# Patient Record
Sex: Female | Born: 1955 | Race: Black or African American | Hispanic: No | Marital: Married | Smoking: Never smoker
Health system: Southern US, Community
[De-identification: ages and names within clinical notes are randomized; demographics above are authoritative.]

## PROBLEM LIST (undated history)

## (undated) DIAGNOSIS — J309 Allergic rhinitis, unspecified: Secondary | ICD-10-CM

## (undated) DIAGNOSIS — N183 Chronic kidney disease, stage 3 unspecified: Secondary | ICD-10-CM

## (undated) DIAGNOSIS — Z923 Personal history of irradiation: Secondary | ICD-10-CM

## (undated) DIAGNOSIS — F419 Anxiety disorder, unspecified: Secondary | ICD-10-CM

## (undated) DIAGNOSIS — M1990 Unspecified osteoarthritis, unspecified site: Secondary | ICD-10-CM

## (undated) DIAGNOSIS — C801 Malignant (primary) neoplasm, unspecified: Secondary | ICD-10-CM

## (undated) DIAGNOSIS — I10 Essential (primary) hypertension: Secondary | ICD-10-CM

## (undated) DIAGNOSIS — D649 Anemia, unspecified: Secondary | ICD-10-CM

## (undated) DIAGNOSIS — T7840XA Allergy, unspecified, initial encounter: Secondary | ICD-10-CM

## (undated) DIAGNOSIS — E785 Hyperlipidemia, unspecified: Secondary | ICD-10-CM

## (undated) DIAGNOSIS — K219 Gastro-esophageal reflux disease without esophagitis: Secondary | ICD-10-CM

## (undated) DIAGNOSIS — K5904 Chronic idiopathic constipation: Secondary | ICD-10-CM

## (undated) DIAGNOSIS — Z9221 Personal history of antineoplastic chemotherapy: Secondary | ICD-10-CM

## (undated) DIAGNOSIS — E079 Disorder of thyroid, unspecified: Secondary | ICD-10-CM

## (undated) DIAGNOSIS — G4730 Sleep apnea, unspecified: Secondary | ICD-10-CM

## (undated) DIAGNOSIS — R42 Dizziness and giddiness: Secondary | ICD-10-CM

## (undated) DIAGNOSIS — G4700 Insomnia, unspecified: Secondary | ICD-10-CM

## (undated) HISTORY — DX: Gastro-esophageal reflux disease without esophagitis: K21.9

## (undated) HISTORY — DX: Insomnia, unspecified: G47.00

## (undated) HISTORY — DX: Chronic kidney disease, stage 3 unspecified: N18.30

## (undated) HISTORY — PX: THYROIDECTOMY, PARTIAL: SHX18

## (undated) HISTORY — DX: Malignant (primary) neoplasm, unspecified: C80.1

## (undated) HISTORY — DX: Disorder of thyroid, unspecified: E07.9

## (undated) HISTORY — DX: Allergic rhinitis, unspecified: J30.9

## (undated) HISTORY — DX: Unspecified osteoarthritis, unspecified site: M19.90

## (undated) HISTORY — DX: Allergy, unspecified, initial encounter: T78.40XA

## (undated) HISTORY — DX: Anxiety disorder, unspecified: F41.9

## (undated) HISTORY — PX: OTHER SURGICAL HISTORY: SHX169

## (undated) HISTORY — DX: Dizziness and giddiness: R42

## (undated) HISTORY — DX: Chronic idiopathic constipation: K59.04

## (undated) HISTORY — DX: Hyperlipidemia, unspecified: E78.5

## (undated) HISTORY — PX: ABDOMINAL HYSTERECTOMY: SHX81

## (undated) HISTORY — DX: Anemia, unspecified: D64.9

---

## 1999-12-28 ENCOUNTER — Emergency Department (HOSPITAL_COMMUNITY): Admission: EM | Admit: 1999-12-28 | Discharge: 1999-12-28 | Payer: Self-pay | Admitting: Emergency Medicine

## 1999-12-28 ENCOUNTER — Encounter: Payer: Self-pay | Admitting: Emergency Medicine

## 2002-08-28 HISTORY — PX: BREAST LUMPECTOMY: SHX2

## 2009-07-25 ENCOUNTER — Ambulatory Visit: Payer: Self-pay | Admitting: Interventional Radiology

## 2009-07-25 ENCOUNTER — Emergency Department (HOSPITAL_BASED_OUTPATIENT_CLINIC_OR_DEPARTMENT_OTHER): Admission: EM | Admit: 2009-07-25 | Discharge: 2009-07-25 | Payer: Self-pay | Admitting: Emergency Medicine

## 2010-11-30 LAB — BASIC METABOLIC PANEL
BUN: 15 mg/dL (ref 6–23)
CO2: 27 mEq/L (ref 19–32)
Calcium: 10.2 mg/dL (ref 8.4–10.5)
Chloride: 107 mEq/L (ref 96–112)
Creatinine, Ser: 1.2 mg/dL (ref 0.4–1.2)
GFR calc Af Amer: 56 mL/min — ABNORMAL LOW (ref >60)
GFR calc non Af Amer: 47 mL/min — ABNORMAL LOW (ref >60)
Glucose, Bld: 71 mg/dL (ref 70–99)
Potassium: 4 mEq/L (ref 3.5–5.1)
Sodium: 145 mEq/L (ref 135–145)

## 2010-11-30 LAB — URINALYSIS, ROUTINE W REFLEX MICROSCOPIC
Bilirubin Urine: NEGATIVE
Glucose, UA: NEGATIVE mg/dL
Hgb urine dipstick: NEGATIVE
Ketones, ur: NEGATIVE mg/dL
Nitrite: NEGATIVE
Protein, ur: NEGATIVE mg/dL
Specific Gravity, Urine: 1.018 (ref 1.005–1.030)
Urobilinogen, UA: 0.2 mg/dL (ref 0.0–1.0)
pH: 6 (ref 5.0–8.0)

## 2010-11-30 LAB — CBC
HCT: 35.7 % — ABNORMAL LOW (ref 36.0–46.0)
Hemoglobin: 12.4 g/dL (ref 12.0–15.0)
MCHC: 34.6 g/dL (ref 30.0–36.0)
MCV: 89.2 fL (ref 78.0–100.0)
Platelets: 230 10*3/uL (ref 150–400)
RBC: 4 MIL/uL (ref 3.87–5.11)
RDW: 12.7 % (ref 11.5–15.5)
WBC: 5.7 10*3/uL (ref 4.0–10.5)

## 2010-11-30 LAB — DIFFERENTIAL
Basophils Absolute: 0.1 10*3/uL (ref 0.0–0.1)
Eosinophils Relative: 3 % (ref 0–5)
Lymphocytes Relative: 26 % (ref 12–46)
Lymphs Abs: 1.5 10*3/uL (ref 0.7–4.0)
Monocytes Absolute: 0.4 10*3/uL (ref 0.1–1.0)
Neutro Abs: 3.5 10*3/uL (ref 1.7–7.7)

## 2013-03-02 ENCOUNTER — Encounter (HOSPITAL_BASED_OUTPATIENT_CLINIC_OR_DEPARTMENT_OTHER): Payer: Self-pay | Admitting: Emergency Medicine

## 2013-03-02 ENCOUNTER — Emergency Department (HOSPITAL_BASED_OUTPATIENT_CLINIC_OR_DEPARTMENT_OTHER)
Admission: EM | Admit: 2013-03-02 | Discharge: 2013-03-02 | Disposition: A | Discharge status: Home / Self Care | Payer: BC Managed Care – PPO | Attending: Emergency Medicine | Admitting: Emergency Medicine

## 2013-03-02 DIAGNOSIS — M79609 Pain in unspecified limb: Secondary | ICD-10-CM | POA: Insufficient documentation

## 2013-03-02 DIAGNOSIS — G4762 Sleep related leg cramps: Secondary | ICD-10-CM

## 2013-03-02 DIAGNOSIS — Z79899 Other long term (current) drug therapy: Secondary | ICD-10-CM | POA: Insufficient documentation

## 2013-03-02 DIAGNOSIS — I10 Essential (primary) hypertension: Secondary | ICD-10-CM | POA: Insufficient documentation

## 2013-03-02 HISTORY — DX: Essential (primary) hypertension: I10

## 2013-03-02 MED ORDER — CYCLOBENZAPRINE HCL 10 MG PO TABS: 10.0000 mg | ORAL_TABLET | Freq: Two times a day (BID) | ORAL | Status: DC | PRN

## 2013-03-02 NOTE — ED Provider Notes (Addendum)
   History    CSN: [id number] Arrival date & time 03/02/13  0726  First MD Initiated Contact with Patient 03/02/13 [phone]     Chief Complaint  Patient presents with  . Leg Pain   (Consider location/radiation/quality/duration/timing/severity/associated sxs/prior Treatment) HPI  Patient was awakened this morning with severe left calf pain.  Has gotten better but also in the anterior part of her thigh.  She's had this before in the other leg and in the same leg.  It occurs and time to time and can be severe.  She's been checked for blood clots before and they've been negative.  She has no recent history of leg swelling or shortness of breath or long trips. Past Medical History  Diagnosis Date  . Hypertension    No past surgical history on file. No family history on file. History  Substance Use Topics  . Smoking status: Not on file  . Smokeless tobacco: Not on file  . Alcohol Use: Not on file   OB History   Grav Para Term Preterm Abortions TAB SAB Ect Mult Living                 Review of Systems All other systems reviewed and are negative Allergies  Review of patient's allergies indicates no known allergies.  Home Medications   Current Outpatient Rx  Name  Route  Sig  Dispense  Refill  . benazepril-hydrochlorthiazide (LOTENSIN HCT) 10-12.5 MG per tablet   Oral   Take 1 tablet by mouth daily.         . cyclobenzaprine (FLEXERIL) 10 MG tablet   Oral   Take 1 tablet (10 mg total) by mouth 2 (two) times daily as needed for muscle spasms.   20 tablet   0    BP 151/73  Pulse 89  Temp(Src) 98.7 F (37.1 C) (Oral)  Resp 18  Ht 5\' 9"  (1.753 m)  Wt 200 lb (90.719 kg)  BMI 29.52 kg/m2  SpO2 98% Physical Exam  Nursing note and vitals reviewed. Constitutional: She is oriented to person, place, and time. She appears well-developed and well-nourished. No distress.  HENT:  Head: Normocephalic and atraumatic.  Eyes: Pupils are equal, round, and reactive to light.  Neck:  Normal range of motion.  Cardiovascular: Normal rate and intact distal pulses.   Pulmonary/Chest: No respiratory distress.  Abdominal: Normal appearance. She exhibits no distension.  Musculoskeletal: Normal range of motion. She exhibits no edema and no tenderness.  Homans sign negative  Neurological: She is alert and oriented to person, place, and time. No cranial nerve deficit.  Skin: Skin is warm and dry. No rash noted.  Psychiatric: She has a normal mood and affect. Her behavior is normal.    ED Course  Procedures (including critical care time) Labs Reviewed - No data to display No results found. 1. Nocturnal leg cramps     MDM  The history and physical exam more consistent with nocturnal leg cramps then DVT.  Patient does not want to wait for an ultrasound.  Instructed her on treatment and gave her information from [facility] clinic on nocturnal leg cramps.  She is going to follow up with her doctor tomorrow.  Invited her to return after 10 AM for ultrasound if she still has concerns.  Nelia Shi, MD 03/02/13 0800  Nelia Shi, MD 03/02/13 0800

## 2013-03-02 NOTE — ED Notes (Signed)
Pt woke up this morning with cramping pain in left calf, and states pain has now moved from calf up to left thigh.

## 2014-01-20 ENCOUNTER — Ambulatory Visit (HOSPITAL_BASED_OUTPATIENT_CLINIC_OR_DEPARTMENT_OTHER)
Admission: RE | Admit: 2014-01-20 | Discharge: 2014-01-20 | Disposition: A | Discharge status: Home / Self Care | Payer: BC Managed Care – PPO | Source: Ambulatory Visit | Attending: *Deleted | Admitting: *Deleted

## 2014-01-20 ENCOUNTER — Other Ambulatory Visit (HOSPITAL_BASED_OUTPATIENT_CLINIC_OR_DEPARTMENT_OTHER): Payer: Self-pay | Admitting: *Deleted

## 2014-01-20 DIAGNOSIS — M79605 Pain in left leg: Secondary | ICD-10-CM

## 2014-01-20 DIAGNOSIS — R609 Edema, unspecified: Secondary | ICD-10-CM | POA: Insufficient documentation

## 2014-01-20 DIAGNOSIS — M79609 Pain in unspecified limb: Secondary | ICD-10-CM | POA: Insufficient documentation

## 2014-06-30 DIAGNOSIS — M5416 Radiculopathy, lumbar region: Secondary | ICD-10-CM | POA: Insufficient documentation

## 2014-06-30 DIAGNOSIS — G9519 Other vascular myelopathies: Secondary | ICD-10-CM | POA: Insufficient documentation

## 2014-06-30 DIAGNOSIS — M5136 Other intervertebral disc degeneration, lumbar region: Secondary | ICD-10-CM | POA: Insufficient documentation

## 2015-10-03 DIAGNOSIS — Z8679 Personal history of other diseases of the circulatory system: Secondary | ICD-10-CM | POA: Insufficient documentation

## 2015-10-03 DIAGNOSIS — Z8719 Personal history of other diseases of the digestive system: Secondary | ICD-10-CM | POA: Insufficient documentation

## 2015-10-03 DIAGNOSIS — E785 Hyperlipidemia, unspecified: Secondary | ICD-10-CM | POA: Insufficient documentation

## 2015-10-03 DIAGNOSIS — K5900 Constipation, unspecified: Secondary | ICD-10-CM | POA: Insufficient documentation

## 2015-10-03 DIAGNOSIS — M2550 Pain in unspecified joint: Secondary | ICD-10-CM | POA: Insufficient documentation

## 2015-10-03 DIAGNOSIS — G4700 Insomnia, unspecified: Secondary | ICD-10-CM | POA: Insufficient documentation

## 2015-10-03 DIAGNOSIS — E890 Postprocedural hypothyroidism: Secondary | ICD-10-CM | POA: Insufficient documentation

## 2015-10-03 DIAGNOSIS — F418 Other specified anxiety disorders: Secondary | ICD-10-CM | POA: Insufficient documentation

## 2015-10-03 DIAGNOSIS — N183 Chronic kidney disease, stage 3 unspecified: Secondary | ICD-10-CM | POA: Insufficient documentation

## 2015-10-03 DIAGNOSIS — I10 Essential (primary) hypertension: Secondary | ICD-10-CM | POA: Insufficient documentation

## 2015-10-03 DIAGNOSIS — Z853 Personal history of malignant neoplasm of breast: Secondary | ICD-10-CM | POA: Insufficient documentation

## 2015-10-03 DIAGNOSIS — K219 Gastro-esophageal reflux disease without esophagitis: Secondary | ICD-10-CM | POA: Insufficient documentation

## 2017-11-06 DIAGNOSIS — E669 Obesity, unspecified: Secondary | ICD-10-CM | POA: Insufficient documentation

## 2017-11-06 DIAGNOSIS — Z6836 Body mass index (BMI) 36.0-36.9, adult: Secondary | ICD-10-CM | POA: Insufficient documentation

## 2020-01-19 DIAGNOSIS — H2513 Age-related nuclear cataract, bilateral: Secondary | ICD-10-CM | POA: Insufficient documentation

## 2020-01-19 DIAGNOSIS — H16223 Keratoconjunctivitis sicca, not specified as Sjogren's, bilateral: Secondary | ICD-10-CM | POA: Insufficient documentation

## 2020-01-19 DIAGNOSIS — H43813 Vitreous degeneration, bilateral: Secondary | ICD-10-CM | POA: Insufficient documentation

## 2020-02-12 DIAGNOSIS — R7301 Impaired fasting glucose: Secondary | ICD-10-CM | POA: Insufficient documentation

## 2020-11-05 ENCOUNTER — Ambulatory Visit (INDEPENDENT_AMBULATORY_CARE_PROVIDER_SITE_OTHER): Payer: Medicare Other | Admitting: Pulmonary Disease

## 2020-11-05 ENCOUNTER — Other Ambulatory Visit: Payer: Self-pay

## 2020-11-05 ENCOUNTER — Ambulatory Visit (INDEPENDENT_AMBULATORY_CARE_PROVIDER_SITE_OTHER): Payer: Medicare Other

## 2020-11-05 ENCOUNTER — Encounter: Payer: Self-pay | Admitting: Pulmonary Disease

## 2020-11-05 VITALS — BP 132/84 | HR 75 | Temp 97.4°F | Ht 69.0 in | Wt 243.0 lb

## 2020-11-05 DIAGNOSIS — R062 Wheezing: Secondary | ICD-10-CM

## 2020-11-05 NOTE — Progress Notes (Signed)
Karina Ferguson    [id number]    July 16, 1956  Primary Care Physician:Everly, Barbarann Ehlers, DO  Referring Physician: No referring provider defined for this encounter.  Chief complaint:   Wheezing, shortness of breath, cough  HPI:  Wheezing cough and shortness of breath for couple years  Was prescribed Singulair-has only been using this as she feels needed Recently prescribed albuterol-has not started to use it yet  Shortness of breath happens at any time the cough and wheezing does happen intermittently as well, no significant predilection for any particular time of the day  Denies a past history of lung disease Never smoker Denies having specific allergies  She does have symptoms suggesting she may have some reflux  Cough and wheezing appears to be worse when laying down and sometimes just at bedtime  No family history of lung disease  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 amLODipine (NORVASC) 10 MG tablet Take 1 tablet by mouth daily.  . benazepril-hydrochlorthiazide (LOTENSIN HCT) 10-12.5 MG per tablet Take 1 tablet by mouth daily.  . hydrochlorothiazide (HYDRODIURIL) 25 MG tablet Take 25 mg by mouth daily.  . metoprolol succinate (TOPROL-XL) 100 MG 24 hr tablet Take 1 tablet by mouth daily.  . montelukast (SINGULAIR) 10 MG tablet Take 10 mg by mouth daily.  . cyclobenzaprine (FLEXERIL) 10 MG tablet Take 1 tablet (10 mg total) by mouth 2 (two) times daily as needed for muscle spasms. (Patient not taking: Reported on 11/05/2020)   No facility-administered encounter medications on file as of 11/05/2020.    Allergies as of 11/05/2020  . (No Known Allergies)    Past Medical History:  Diagnosis Date  . Hypertension       No family history on file.  Social History   Socioeconomic History  .  Marital status: Married    Spouse name: Not on file  . Number of children: Not on file  . Years of education: Not on file  . Highest education level: Not on file  Occupational History  . Not on file  Tobacco Use  . Smoking status: Never Smoker  . Smokeless tobacco: Never Used  Substance and Sexual Activity  . Alcohol use: Not on file  . Drug use: Not on file  . Sexual activity: Not on file  Other Topics Concern  . Not on file  Social History Narrative  . Not on file   Social Determinants of Health   Financial Resource Strain: Not on file  Food Insecurity: Not on file  Transportation Needs: Not on file  Physical Activity: Not on file  Stress: Not on file  Social Connections: Not on file  Intimate Partner Violence: Not on file    Review of Systems  Respiratory: Positive for cough, shortness of breath and wheezing.     Vitals:   11/05/20 1329  BP: 132/84  Pulse: 75  Temp: (!) 97.4 F (36.3 C)  SpO2: 99%     Physical Exam Constitutional:      Appearance: She is obese.  HENT:     Head: Normocephalic.     Mouth/Throat:     Mouth: Mucous membranes are moist.  Eyes:     Pupils: Pupils are equal, round, and reactive to light.  Cardiovascular:     Rate and Rhythm: Normal rate and regular rhythm.  Heart sounds: No murmur heard.   Pulmonary:     Effort: No respiratory distress.     Breath sounds: No stridor. No wheezing or rhonchi.  Musculoskeletal:     Cervical back: No rigidity or tenderness.  Neurological:     Mental Status: She is alert.  Psychiatric:        Mood and Affect: Mood normal.    Data Reviewed: No recent chest x-rays  Assessment:  Cough/wheezing  Shortness of breath  Probable environmental allergies  Probable reflux  Plan/Recommendations:  Encouraged to use Singulair on a regular basis  Use albuterol as needed up to 4 times a day as needed  Schedule for chest x-ray today  Schedule pulmonary function test  Encouraged to  discuss possibility of starting antireflux medications  Tentative follow-up in 4 to 6 weeks   Sherrilyn Rist MD [facility] 11/05/2020, 1:36 PM  CC: No ref. provider found

## 2020-11-05 NOTE — Patient Instructions (Signed)
Cough, wheezing, shortness of breath  -Use Singulair on a regular basis -Use albuterol as needed up to 4 times a day -Ensure that you look up how to use inhalers, so that you get as good an affect as possible  We will schedule you for a breathing study Obtain a chest x-ray today  I will see you back in 4 to 6 weeks  Talk to your doctor about reflux-you may need medications to help this as it may contribute to cough and wheezing

## 2020-12-17 ENCOUNTER — Other Ambulatory Visit (HOSPITAL_COMMUNITY)
Admission: RE | Admit: 2020-12-17 | Discharge: 2020-12-17 | Disposition: A | Discharge status: Home / Self Care | Payer: Medicare Other | Source: Ambulatory Visit | Attending: Pulmonary Disease | Admitting: Pulmonary Disease

## 2020-12-17 DIAGNOSIS — Z20822 Contact with and (suspected) exposure to covid-19: Secondary | ICD-10-CM | POA: Insufficient documentation

## 2020-12-17 DIAGNOSIS — Z01812 Encounter for preprocedural laboratory examination: Secondary | ICD-10-CM | POA: Diagnosis present

## 2020-12-17 LAB — SARS CORONAVIRUS 2 (TAT 6-24 HRS): SARS Coronavirus 2: NEGATIVE

## 2020-12-20 ENCOUNTER — Ambulatory Visit (INDEPENDENT_AMBULATORY_CARE_PROVIDER_SITE_OTHER): Payer: Medicare Other | Admitting: Pulmonary Disease

## 2020-12-20 ENCOUNTER — Other Ambulatory Visit: Payer: Self-pay

## 2020-12-20 ENCOUNTER — Encounter: Payer: Self-pay | Admitting: Pulmonary Disease

## 2020-12-20 VITALS — BP 146/86 | HR 82 | Temp 97.8°F | Ht 69.0 in | Wt 243.0 lb

## 2020-12-20 DIAGNOSIS — R062 Wheezing: Secondary | ICD-10-CM | POA: Diagnosis not present

## 2020-12-20 DIAGNOSIS — R059 Cough, unspecified: Secondary | ICD-10-CM | POA: Diagnosis not present

## 2020-12-20 LAB — PULMONARY FUNCTION TEST
DL/VA % pred: 132 %
DL/VA: 5.34 ml/min/mmHg/L
DLCO cor % pred: 101 %
DLCO cor: 23.52 ml/min/mmHg
DLCO unc % pred: 101 %
DLCO unc: 23.52 ml/min/mmHg
FEF 25-75 Post: 2.29 L/sec
FEF 25-75 Pre: 2.14 L/sec
FEF2575-%Change-Post: 6 %
FEF2575-%Pred-Post: 103 %
FEF2575-%Pred-Pre: 96 %
FEV1-%Change-Post: 1 %
FEV1-%Pred-Post: 99 %
FEV1-%Pred-Pre: 97 %
FEV1-Post: 2.39 L
FEV1-Pre: 2.35 L
FEV1FVC-%Change-Post: -5 %
FEV1FVC-%Pred-Pre: 103 %
FEV6-%Change-Post: 5 %
FEV6-%Pred-Post: 103 %
FEV6-%Pred-Pre: 97 %
FEV6-Post: 3.08 L
FEV6-Pre: 2.91 L
FEV6FVC-%Change-Post: -1 %
FEV6FVC-%Pred-Post: 101 %
FEV6FVC-%Pred-Pre: 103 %
FVC-%Change-Post: 7 %
FVC-%Pred-Post: 101 %
FVC-%Pred-Pre: 94 %
FVC-Post: 3.12 L
FVC-Pre: 2.91 L
Post FEV1/FVC ratio: 77 %
Post FEV6/FVC ratio: 99 %
Pre FEV1/FVC ratio: 81 %
Pre FEV6/FVC Ratio: 100 %
RV % pred: 119 %
RV: 2.82 L
TLC % pred: 104 %
TLC: 6.05 L

## 2020-12-20 NOTE — Progress Notes (Signed)
PFT done today. 

## 2020-12-20 NOTE — Patient Instructions (Signed)
I will see you back in 6 months  Your breathing study as we looked over was within normal limits  If you are still having significant wheezing that is concerning, trial with inhaler will be appropriate  Continue using medications for the reflux  Graded exercises as tolerated, weight loss efforts  Call us with any significant concerns

## 2020-12-20 NOTE — Progress Notes (Signed)
Karina Ferguson    [id number]    [birth year]  Primary Care Physician:Everly, Barbarann Ehlers, DO  Referring Physician: Nicola Girt, [address] [place],  [place] [address]  Chief complaint:   Wheezing, shortness of breath, cough  HPI:  Wheezing cough and shortness of breath for couple years  Wheezing is better Occasional cough is better She continues medications for reflux  She had a PFT done today which was within normal limits  Shortness of breath with significant exertion -She has not considered a self limited in any way  Denies a past history of lung disease Never smoker Denies having specific allergies  She does have symptoms suggesting she may have some reflux  Cough and wheezing appears to be worse when laying down and sometimes just at bedtime  No family history of lung disease  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 amLODipine (NORVASC) 10 MG tablet Take 1 tablet by mouth daily.  . benazepril-hydrochlorthiazide (LOTENSIN HCT) 10-12.5 MG per tablet Take 1 tablet by mouth daily.  . cyclobenzaprine (FLEXERIL) 10 MG tablet Take 1 tablet (10 mg total) by mouth 2 (two) times daily as needed for muscle spasms.  . hydrochlorothiazide (HYDRODIURIL) 25 MG tablet Take 25 mg by mouth daily.  . metoprolol succinate (TOPROL-XL) 100 MG 24 hr tablet Take 1 tablet by mouth daily.  . montelukast (SINGULAIR) 10 MG tablet Take 10 mg by mouth daily.   No facility-administered encounter medications on file as of 12/20/2020.    Allergies as of 12/20/2020  . (No Known Allergies)    Past Medical History:  Diagnosis Date  . Hypertension       No family history on file.  Social History   Socioeconomic History  . Marital status: Married    Spouse name: Not on file  . Number of  children: Not on file  . Years of education: Not on file  . Highest education level: Not on file  Occupational History  . Not on file  Tobacco Use  . Smoking status: Never Smoker  . Smokeless tobacco: Never Used  Substance and Sexual Activity  . Alcohol use: Not on file  . Drug use: Not on file  . Sexual activity: Not on file  Other Topics Concern  . Not on file  Social History Narrative  . Not on file   Social Determinants of Health   Financial Resource Strain: Not on file  Food Insecurity: Not on file  Transportation Needs: Not on file  Physical Activity: Not on file  Stress: Not on file  Social Connections: Not on file  Intimate Partner Violence: Not on file    Review of Systems  Constitutional: Negative for fatigue.  Respiratory: Positive for cough, shortness of breath and wheezing.     Vitals:   12/20/20 1152  BP: (!) 146/86  Pulse: 82  Temp: 97.8 F (36.6 C)  SpO2: 98%     Physical Exam Constitutional:      Appearance: She is obese.  HENT:     Head: Normocephalic and atraumatic.     Mouth/Throat:     Mouth: Mucous membranes are moist.  Eyes:     Pupils: Pupils are equal, round, and reactive to light.  Cardiovascular:     Rate and Rhythm: Normal rate and regular  rhythm.     Heart sounds: Normal heart sounds. No murmur heard.   Pulmonary:     Effort: No respiratory distress.     Breath sounds: No stridor. No wheezing or rhonchi.  Musculoskeletal:     Cervical back: No rigidity or tenderness.  Neurological:     Mental Status: She is alert.  Psychiatric:        Mood and Affect: Mood normal.    Data Reviewed: No recent chest x-rays  PFT reviewed with patient showing no obstruction, no restriction, normal diffusing capacity  Assessment:  Cough/wheezing -Overall improved  Shortness of breath -Improved -Not limited with activities of daily living  Probable environmental allergies  Probable reflux  -On  omeprazole  Plan/Recommendations:   Continue Singulair on a regular basis   Does not desire to be on on an inhaler at present Albuterol did not really help in the past  No other changes to treatment at present  I will see her back in 6 months  I did encourage her to give [place] a call if at any time she feels that wheezing or shortness of breath is worse, a combination inhaler may be tried  I spent 30 minutes dedicated to the care of this patient on the date of this encounter to include previsit review of records, face-to-face time with the patient discussing conditions above, post visit ordering of testing, clinical documentation with electronic health record and communicated necessary findings to members of the patient's care team Sherrilyn Rist MD [facility] 12/20/2020, 12:00 PM  CC: Nicola Girt, DO

## 2021-01-10 ENCOUNTER — Other Ambulatory Visit: Payer: Self-pay

## 2021-01-10 ENCOUNTER — Encounter (HOSPITAL_COMMUNITY): Payer: Self-pay

## 2021-01-10 ENCOUNTER — Emergency Department (HOSPITAL_BASED_OUTPATIENT_CLINIC_OR_DEPARTMENT_OTHER): Payer: Medicare Other

## 2021-01-10 ENCOUNTER — Emergency Department (HOSPITAL_COMMUNITY): Payer: Medicare Other

## 2021-01-10 ENCOUNTER — Emergency Department (HOSPITAL_COMMUNITY)
Admission: EM | Admit: 2021-01-10 | Discharge: 2021-01-10 | Disposition: A | Discharge status: Home / Self Care | Payer: Medicare Other | Attending: Emergency Medicine | Admitting: Emergency Medicine

## 2021-01-10 DIAGNOSIS — M79662 Pain in left lower leg: Secondary | ICD-10-CM | POA: Insufficient documentation

## 2021-01-10 DIAGNOSIS — Z79899 Other long term (current) drug therapy: Secondary | ICD-10-CM | POA: Insufficient documentation

## 2021-01-10 DIAGNOSIS — M79652 Pain in left thigh: Secondary | ICD-10-CM | POA: Insufficient documentation

## 2021-01-10 DIAGNOSIS — R252 Cramp and spasm: Secondary | ICD-10-CM

## 2021-01-10 DIAGNOSIS — M79605 Pain in left leg: Secondary | ICD-10-CM

## 2021-01-10 DIAGNOSIS — R29818 Other symptoms and signs involving the nervous system: Secondary | ICD-10-CM

## 2021-01-10 DIAGNOSIS — H538 Other visual disturbances: Secondary | ICD-10-CM | POA: Insufficient documentation

## 2021-01-10 DIAGNOSIS — I10 Essential (primary) hypertension: Secondary | ICD-10-CM | POA: Diagnosis not present

## 2021-01-10 DIAGNOSIS — G458 Other transient cerebral ischemic attacks and related syndromes: Secondary | ICD-10-CM | POA: Diagnosis not present

## 2021-01-10 LAB — ETHANOL: Alcohol, Ethyl (B): <10 mg/dL (ref <10)

## 2021-01-10 LAB — TROPONIN I (HIGH SENSITIVITY)
Troponin I (High Sensitivity): 3 ng/L (ref <18)
Troponin I (High Sensitivity): 3 ng/L (ref <18)

## 2021-01-10 LAB — RAPID URINE DRUG SCREEN, HOSP PERFORMED
Amphetamines: NOT DETECTED
Barbiturates: NOT DETECTED
Benzodiazepines: NOT DETECTED
Cocaine: NOT DETECTED
Opiates: NOT DETECTED
Tetrahydrocannabinol: NOT DETECTED

## 2021-01-10 LAB — URINALYSIS, ROUTINE W REFLEX MICROSCOPIC
Bilirubin Urine: NEGATIVE
Glucose, UA: NEGATIVE mg/dL
Hgb urine dipstick: NEGATIVE
Ketones, ur: NEGATIVE mg/dL
Leukocytes,Ua: NEGATIVE
Nitrite: NEGATIVE
Protein, ur: NEGATIVE mg/dL
Specific Gravity, Urine: 1.01 (ref 1.005–1.030)
pH: 7 (ref 5.0–8.0)

## 2021-01-10 LAB — CBC WITH DIFFERENTIAL/PLATELET
Abs Immature Granulocytes: 0.01 10*3/uL (ref 0.00–0.07)
Basophils Absolute: 0 10*3/uL (ref 0.0–0.1)
Basophils Relative: 1 %
Eosinophils Absolute: 0.1 10*3/uL (ref 0.0–0.5)
Eosinophils Relative: 2 %
HCT: 39 % (ref 36.0–46.0)
Hemoglobin: 12.7 g/dL (ref 12.0–15.0)
Immature Granulocytes: 0 %
Lymphocytes Relative: 30 %
Lymphs Abs: 1.8 10*3/uL (ref 0.7–4.0)
MCH: 29.3 pg (ref 26.0–34.0)
MCHC: 32.6 g/dL (ref 30.0–36.0)
MCV: 89.9 fL (ref 80.0–100.0)
Monocytes Absolute: 0.4 10*3/uL (ref 0.1–1.0)
Monocytes Relative: 7 %
Neutro Abs: 3.7 10*3/uL (ref 1.7–7.7)
Neutrophils Relative %: 60 %
Platelets: 251 10*3/uL (ref 150–400)
RBC: 4.34 MIL/uL (ref 3.87–5.11)
RDW: 13.4 % (ref 11.5–15.5)
WBC: 6.1 10*3/uL (ref 4.0–10.5)
nRBC: 0 % (ref 0.0–0.2)

## 2021-01-10 LAB — COMPREHENSIVE METABOLIC PANEL
ALT: 19 U/L (ref 0–44)
AST: 21 U/L (ref 15–41)
Albumin: 3.6 g/dL (ref 3.5–5.0)
Alkaline Phosphatase: 51 U/L (ref 38–126)
Anion gap: 8 (ref 5–15)
BUN: 11 mg/dL (ref 8–23)
CO2: 27 mmol/L (ref 22–32)
Calcium: 10.2 mg/dL (ref 8.9–10.3)
Chloride: 106 mmol/L (ref 98–111)
Creatinine, Ser: 1.16 mg/dL — ABNORMAL HIGH (ref 0.44–1.00)
GFR, Estimated: 52 mL/min — ABNORMAL LOW (ref >60)
Glucose, Bld: 152 mg/dL — ABNORMAL HIGH (ref 70–99)
Potassium: 3.6 mmol/L (ref 3.5–5.1)
Sodium: 141 mmol/L (ref 135–145)
Total Bilirubin: 0.6 mg/dL (ref 0.3–1.2)
Total Protein: 6.8 g/dL (ref 6.5–8.1)

## 2021-01-10 LAB — TSH: TSH: 1.301 u[IU]/mL (ref 0.350–4.500)

## 2021-01-10 LAB — PROTIME-INR
INR: 1 (ref 0.8–1.2)
Prothrombin Time: 13.4 seconds (ref 11.4–15.2)

## 2021-01-10 MED ORDER — ACETAMINOPHEN 325 MG PO TABS
650.0000 mg | ORAL_TABLET | Freq: Once | ORAL | Status: AC
  Administered 2021-01-10: 650 mg via ORAL
  Filled 2021-01-10: qty 2

## 2021-01-10 NOTE — Progress Notes (Signed)
Left lower extremity venous duplex completed. Refer to "CV Proc" under chart review to view preliminary results.  01/10/2021 12:22 PM Kelby Aline., MHA, RVT, RDCS, RDMS

## 2021-01-10 NOTE — ED Notes (Signed)
Pt refuses MRI of brain

## 2021-01-10 NOTE — ED Provider Notes (Addendum)
Emergency Medicine Provider Triage Evaluation Note  Karina Ferguson , a 65 y.o. female  was evaluated in triage.  Pt complains of 4 days of left calf pain, now extending into the left thigh. Woke this morning with pain and altered sensation in the left arm and left face, with persistence of the LLE pain. Does endorse approximately 30 seconds of blurry vision with onset of pain. Altered sensation and blurry vision completely resolved at this time. LLE pain persists. No hx of CVA, not anticoagulated.  Altered sensation started at 8:30 am, resolved prior to arrival in the ED.  Review of Systems  Positive: Blurry vision, altered sensation in the left face and arm, Left calf pain Negative: CP, SOB, weakness, altered gait, syncope, trauma  Physical Exam  BP 133/82 (BP Location: Left Arm)   Pulse 74   Temp 98.6 F (37 C) (Oral)   Resp 16   SpO2 98%  Gen:   Awake, no distress   Resp:  Normal effort  MSK:   Moves extremities without difficulty  Other:  Neurologic exam without focal deficit at this time  Medical Decision Making  Medically screening exam initiated at 11:03 AM.  Appropriate orders placed.  Fran Neiswonger was informed that the remainder of the evaluation will be completed by another provider, this initial triage assessment does not replace that evaluation, and the importance of remaining in the ED until their evaluation is complete.  Given total resolution of patient's symptoms at this time, as well as normal neurologic exam in triage, patient does not meet criteria for code stroke. Will work up for stroke, but will not activate code stroke protocol.   Emeline Darling, PA-C 01/10/21 1111    Leon Goodnow, Gypsy Balsam, PA-C 01/10/21 1425    Lucrezia Starch, MD 01/11/21 1140

## 2021-01-10 NOTE — ED Notes (Signed)
Pt aware to check my chart for thyroid results

## 2021-01-10 NOTE — ED Notes (Signed)
Pt states left arm is "sore", denies any n/v/d, states has had some SOB for the past year with wheezing - states she doesn't know what is causing this, is seeing a pulmonologist for the wheezing, currently no wheezing, states she came today because she is having pain in her left arm and left leg and she is concerned that she may have a blood clot

## 2021-01-10 NOTE — Discharge Instructions (Addendum)
We recommended remaining in ER for further observation and obtaining the MRI to more completely rule out stroke.  You may return at any time.  If you develop any worsening symptoms, chest discomfort, numbness, weakness, speech or vision change, please come back to ER for reassessment.  Please follow-up with your primary care doctor.  They can help coordinate outpatient MRI.

## 2021-01-10 NOTE — ED Provider Notes (Signed)
[facility]PART[facility] Provider Note   CSN: [id number] Arrival date & time: 01/10/21  1040     History Chief Complaint  Patient presents with  . Weakness    Karina Ferguson is a 65 y.o. female presents with concern for 4 days of left calf pain that extends into the left thigh.  She additionally woke up this morning with pain and altered sensation in the left face and left arm with persistent left lower extremity pain.  Endorses approximately 30 seconds of blurry vision associated with the onset of her facial pain.  She endorses complete resolution of the altered sensation and blurry vision at the time of her arrival to the ED.  She does have persistent left lower extremity pain.  She has no history of CVA she is not on any anticoagulation.  Have personally reviewed the patient's medical records.  She has history of hypertension on benazepril HCTZ and amlodipine and metoprolol, also on amlodipine.  HPI     Past Medical History:  Diagnosis Date  . Hypertension     There are no problems to display for this patient.      OB History   No obstetric history on file.     No family history on file.  Social History   Tobacco Use  . Smoking status: Never Smoker  . Smokeless tobacco: Never Used  Vaping Use  . Vaping Use: Never used  Substance Use Topics  . Alcohol use: Yes  . Drug use: Never    Home Medications Prior to Admission medications   Medication Sig Start Date End Date Taking? Authorizing Provider  amLODipine (NORVASC) 10 MG tablet Take 1 tablet by mouth daily. 02/12/20   [provider]  benazepril-hydrochlorthiazide (LOTENSIN HCT) 10-12.5 MG per tablet Take 1 tablet by mouth daily.    [provider]  cyclobenzaprine (FLEXERIL) 10 MG tablet Take 1 tablet (10 mg total) by mouth 2 (two) times daily as needed for muscle spasms. 03/02/13   Leonard Schwartz, MD  hydrochlorothiazide (HYDRODIURIL) 25 MG tablet Take 25 mg by mouth  daily. 11/03/20   [provider]  metoprolol succinate (TOPROL-XL) 100 MG 24 hr tablet Take 1 tablet by mouth daily. 02/12/20   [provider]  montelukast (SINGULAIR) 10 MG tablet Take 10 mg by mouth daily. 10/16/20   [provider]    Allergies    Patient has no known allergies.  Review of Systems   Review of Systems  Constitutional: Negative.   HENT: Negative.   Respiratory: Negative.   Cardiovascular:       LEFT CALF PAIN  Gastrointestinal: Negative.   Genitourinary: Positive for dysuria and urgency. Negative for frequency.  Musculoskeletal: Positive for myalgias.  Allergic/Immunologic: Negative for immunocompromised state.  Neurological: Positive for light-headedness, numbness and headaches. Negative for weakness.    Physical Exam Updated Vital Signs BP 136/72   Pulse (!) 57   Temp 98.8 F (37.1 C) (Oral)   Resp 14   Ht 5\' 9"  (1.753 m)   Wt 110 kg   SpO2 100%   BMI 35.81 kg/m   Physical Exam Vitals and nursing note reviewed.  Constitutional:      Appearance: She is obese. She is not ill-appearing or toxic-appearing.  HENT:     Head: Normocephalic and atraumatic.     Nose: Nose normal.     Mouth/Throat:     Mouth: Mucous membranes are moist.     Pharynx: Oropharynx is clear. Uvula midline.  No oropharyngeal exudate, posterior oropharyngeal erythema or uvula swelling.     Tonsils: No tonsillar exudate.  Eyes:     General: Lids are normal. Vision grossly intact.        Right eye: No discharge.        Left eye: No discharge.     Extraocular Movements: Extraocular movements intact.     Conjunctiva/sclera: Conjunctivae normal.     Pupils: Pupils are equal, round, and reactive to light.     Visual Fields: Right eye visual fields normal and left eye visual fields normal.  Neck:     Trachea: Trachea and phonation normal.  Cardiovascular:     Rate and Rhythm: Normal rate and regular rhythm.     Pulses: Normal pulses.     Heart sounds:  Normal heart sounds. No murmur heard.     Comments: Left calf TTP Pulmonary:     Effort: Pulmonary effort is normal. No tachypnea, bradypnea, accessory muscle usage, prolonged expiration or respiratory distress.     Breath sounds: Normal breath sounds. No wheezing or rales.  Chest:     Chest wall: No mass, lacerations, deformity, swelling, tenderness, crepitus or edema.  Abdominal:     General: Bowel sounds are normal. There is no distension.     Palpations: Abdomen is soft.     Tenderness: There is no abdominal tenderness. There is no right CVA tenderness, left CVA tenderness, guarding or rebound.  Musculoskeletal:        General: No deformity.     Cervical back: Normal range of motion and neck supple. No rigidity or crepitus. No pain with movement, spinous process tenderness or muscular tenderness.     Right lower leg: No edema.     Left lower leg: No edema.     Comments: 5/5 strength bilaterally in grip, elbow flexion/extension, plantar/dorsiflexion bilaterally  Lymphadenopathy:     Cervical: No cervical adenopathy.  Skin:    General: Skin is warm and dry.  Neurological:     General: No focal deficit present.     Mental Status: She is alert and oriented to person, place, and time. Mental status is at baseline.     GCS: GCS eye subscore is 4. GCS verbal subscore is 5. GCS motor subscore is 6.     Cranial Nerves: Cranial nerves are intact.     Sensory: Sensation is intact.     Motor: Motor function is intact.     Coordination: Coordination is intact.     Gait: Gait is intact.  Psychiatric:        Mood and Affect: Mood normal.     ED Results / Procedures / Treatments   Labs (all labs ordered are listed, but only abnormal results are displayed) Labs Reviewed  COMPREHENSIVE METABOLIC PANEL - Abnormal; Notable for the following components:      Result Value   Glucose, Bld 152 (*)    Creatinine, Ser 1.16 (*)    GFR, Estimated 52 (*)    All other components within normal  limits  CBC WITH DIFFERENTIAL/PLATELET  PROTIME-INR  URINALYSIS, ROUTINE W REFLEX MICROSCOPIC  RAPID URINE DRUG SCREEN, HOSP PERFORMED  ETHANOL  TSH  TROPONIN I (HIGH SENSITIVITY)  TROPONIN I (HIGH SENSITIVITY)    EKG None  Radiology CT Head Wo Contrast  Result Date: 01/10/2021 CLINICAL DATA:  Altered sensation left side EXAM: CT HEAD WITHOUT CONTRAST TECHNIQUE: Contiguous axial images were obtained from the base of the skull through the vertex without intravenous contrast.  COMPARISON:  None. FINDINGS: Brain: No evidence of acute infarction, hemorrhage, hydrocephalus, extra-axial collection or mass lesion/mass effect. Vascular: Negative for hyperdense vessel Skull: Negative Sinuses/Orbits: Minimal mucosal edema left maxillary sinus otherwise clear sinuses. Negative orbit Other: None IMPRESSION: Negative CT head Electronically Signed   By: Franchot Gallo M.D.   On: 01/10/2021 12:56   VAS [place] LOWER EXTREMITY VENOUS (DVT) ([facility] and [facility] 7a-7p)  Result Date: 01/10/2021  Lower Venous DVT Study Patient Name:  TELLA HOPFENSPERGER  Date of Exam:   01/10/2021 Medical Rec #: [id number]     Accession #:    [id number] Date of Birth: [birth year]      Patient Gender: F Patient Age:   64Y Exam Location:  [facility] Procedure:      VAS [place] LOWER EXTREMITY VENOUS (DVT) Referring Phys: [id number] [place] --------------------------------------------------------------------------------  Indications: Left leg cramp x 1 month, worsening for 3 days.  Limitations: Poor ultrasound/tissue interface. Comparison Study: No prior study Performing Technologist: Maudry Mayhew MHA, RDMS, RVT, RDCS  Examination Guidelines: A complete evaluation includes B-mode imaging, spectral Doppler, color Doppler, and power Doppler as needed of all accessible portions of each vessel. Bilateral testing is considered an integral part of a complete examination. Limited examinations for reoccurring indications may be performed as noted.  The reflux portion of the exam is performed with the patient in reverse Trendelenburg.  +-----+---------------+---------+-----------+----------+--------------+ RIGHTCompressibilityPhasicitySpontaneityPropertiesThrombus Aging +-----+---------------+---------+-----------+----------+--------------+ CFV  Full           Yes      Yes                                 +-----+---------------+---------+-----------+----------+--------------+   +---------+---------------+---------+-----------+----------+--------------+ LEFT     CompressibilityPhasicitySpontaneityPropertiesThrombus Aging +---------+---------------+---------+-----------+----------+--------------+ CFV      Full           Yes      Yes                                 +---------+---------------+---------+-----------+----------+--------------+ SFJ      Full                                                        +---------+---------------+---------+-----------+----------+--------------+ FV Prox  Full                                                        +---------+---------------+---------+-----------+----------+--------------+ FV Mid   Full                                                        +---------+---------------+---------+-----------+----------+--------------+ FV DistalFull                                                        +---------+---------------+---------+-----------+----------+--------------+  PFV      Full                                                        +---------+---------------+---------+-----------+----------+--------------+ POP      Full           Yes      Yes                                 +---------+---------------+---------+-----------+----------+--------------+ PTV      Full                                                        +---------+---------------+---------+-----------+----------+--------------+ PERO     Full                                                         +---------+---------------+---------+-----------+----------+--------------+     Summary: RIGHT: - No evidence of common femoral vein obstruction.  LEFT: - There is no evidence of deep vein thrombosis in the lower extremity.  - No cystic structure found in the popliteal fossa.  *See table(s) above for measurements and observations. Electronically signed by Monica Martinez MD on 01/10/2021 at 4:58:14 PM.    Final     Procedures Procedures   Medications Ordered in ED Medications  acetaminophen (TYLENOL) tablet 650 mg (650 mg Oral Given 01/10/21 1458)    ED Course  I have reviewed the triage vital signs and the nursing notes.  Pertinent labs & imaging results that were available during my care of the patient were reviewed by me and considered in my medical decision making (see chart for details).  Clinical Course as of 01/10/21 1749  Mon Jan 10, 2021  1431 Consult placed to neurologist to discuss patient's case.  [RS]  1437 Consult call received back from neurologist, Dr. Rory Percy, who recommends proceeding with MRI of the brain without contrast, to rule out thalamic stroke.  If normal patient may follow-up in the outpatient setting.  Appreciate his collaboration care of this patient. [RS]    Clinical Course User Index [RS] Keeshia Sanderlin, Sharlene Dory   MDM Rules/Calculators/A&P                         65 year-old female presents with concern for episode of altered sensation in the left Face and Left Arm with associated blurry vision this morning, all resolved prior to arrival to the emergency department.  4 days of left calf pain.  Differential diagnosis includes but is not limited to CVA, TIA, CAD, Bell's palsy, complicated migraine, preterminal neuralgia, herpes zoster, MI/ACS.  Vital signs are normal on intake.  Cardiopulmonary exam is normal, abdominal exam is benign.  Neurologic exam is without focal deficit.  Patient with persistent frontal headache at this time,  however no longer experiencing altered sensation in the left upper the face or the left arm.  She  does have some residual soreness in the left arm without neurologic deficit.  Basic labs were obtained in triage.  CBC is unremarkable, CMP with creatinine 1.16, patient baseline.  Troponin is normal, 3, coag studies normal.  CT head negative for acute intracranial abnormality, venous Doppler negative for DVT in the left lower extremity or obstruction of the right common femoral vein.  EKG with normal sinus rhythm without any ST  change.   Patient revealed with further conversation that she has had partial thyroidectomy, has not had TSH monitored in approximately 1 year. She is not on any supplementation as she did not feel well with levothyroxine in the past.  Will obtain TSH today.  UA pending.  Will assess these new labs as well as orthostatic vital signs.  If labs return normal and there is no symptoms of orthostasis, patient may be dispositioned per neurology recommendations. ABCD2 score of 2, will proceed with MRI brain per neurology recommendation.   Patient was evaluated by the ED attending at the bedside, at which time she informed him that she was unwilling to remain in the emergency department for MRI to complete her stroke evaluation.  Attending explained risks of leaving prior to completion of her work-up, patient was adamant that she wishes to follow-up outpatient and be discharged at this time.  Neurologic exam remains normal at this time and vital signs are stable.  Allysandra voiced understanding of her medical evaluation and treatment plan thus far, as well as understanding of our recommendation to remain in the emergency department for MRI and further observation.  Each of her questions was answered to her expressed satisfaction.  Strict return precautions were given.  Patient has left the department at this time.  This chart was dictated using voice recognition software, Dragon. Despite the  best efforts of this provider to proofread and correct errors, errors may still occur which can change documentation meaning.  Final Clinical Impression(s) / ED Diagnoses Final diagnoses:  Transient neurological symptoms  Left leg pain    Rx / DC Orders ED Discharge Orders    None       Aura Dials 01/10/21 1749    Lucrezia Starch, MD 01/12/21 2103

## 2021-01-10 NOTE — ED Triage Notes (Signed)
Pt reports having a Left side leg cramp Friday night, woke up Saturday am with leg discomfort. Woke this am with Left arm feeling funny and decrease sensation to her Left face. No neuro deficits noted in triage. Evaluated by PA in traige

## 2021-04-07 ENCOUNTER — Encounter: Payer: Self-pay | Admitting: Internal Medicine

## 2021-04-11 DIAGNOSIS — K589 Irritable bowel syndrome without diarrhea: Secondary | ICD-10-CM | POA: Insufficient documentation

## 2021-04-12 ENCOUNTER — Other Ambulatory Visit (INDEPENDENT_AMBULATORY_CARE_PROVIDER_SITE_OTHER): Payer: Medicare Other

## 2021-04-12 ENCOUNTER — Ambulatory Visit (INDEPENDENT_AMBULATORY_CARE_PROVIDER_SITE_OTHER): Payer: Medicare Other | Admitting: Internal Medicine

## 2021-04-12 ENCOUNTER — Encounter: Payer: Self-pay | Admitting: Internal Medicine

## 2021-04-12 VITALS — BP 142/90 | HR 76 | Ht 69.0 in | Wt 235.0 lb

## 2021-04-12 DIAGNOSIS — R1032 Left lower quadrant pain: Secondary | ICD-10-CM | POA: Diagnosis not present

## 2021-04-12 DIAGNOSIS — R194 Change in bowel habit: Secondary | ICD-10-CM

## 2021-04-12 DIAGNOSIS — R10814 Left lower quadrant abdominal tenderness: Secondary | ICD-10-CM

## 2021-04-12 DIAGNOSIS — K625 Hemorrhage of anus and rectum: Secondary | ICD-10-CM

## 2021-04-12 LAB — COMPREHENSIVE METABOLIC PANEL
ALT: 20 U/L (ref 0–35)
AST: 22 U/L (ref 0–37)
Albumin: 4.4 g/dL (ref 3.5–5.2)
Alkaline Phosphatase: 54 U/L (ref 39–117)
BUN: 11 mg/dL (ref 6–23)
CO2: 26 mEq/L (ref 19–32)
Calcium: 10.5 mg/dL (ref 8.4–10.5)
Chloride: 99 mEq/L (ref 96–112)
Creatinine, Ser: 1.02 mg/dL (ref 0.40–1.20)
GFR: 57.05 mL/min — ABNORMAL LOW (ref >60.00)
Glucose, Bld: 97 mg/dL (ref 70–99)
Potassium: 3.5 mEq/L (ref 3.5–5.1)
Sodium: 136 mEq/L (ref 135–145)
Total Bilirubin: 0.6 mg/dL (ref 0.2–1.2)
Total Protein: 7.6 g/dL (ref 6.0–8.3)

## 2021-04-12 LAB — CBC WITH DIFFERENTIAL/PLATELET
Basophils Absolute: 0.1 10*3/uL (ref 0.0–0.1)
Basophils Relative: 0.8 % (ref 0.0–3.0)
Eosinophils Absolute: 0 10*3/uL (ref 0.0–0.7)
Eosinophils Relative: 0.3 % (ref 0.0–5.0)
HCT: 40.9 % (ref 36.0–46.0)
Hemoglobin: 13.7 g/dL (ref 12.0–15.0)
Lymphocytes Relative: 22.8 % (ref 12.0–46.0)
Lymphs Abs: 2.1 10*3/uL (ref 0.7–4.0)
MCHC: 33.5 g/dL (ref 30.0–36.0)
MCV: 88.2 fl (ref 78.0–100.0)
Monocytes Absolute: 0.8 10*3/uL (ref 0.1–1.0)
Monocytes Relative: 9 % (ref 3.0–12.0)
Neutro Abs: 6.1 10*3/uL (ref 1.4–7.7)
Neutrophils Relative %: 67.1 % (ref 43.0–77.0)
Platelets: 266 10*3/uL (ref 150.0–400.0)
RBC: 4.63 Mil/uL (ref 3.87–5.11)
RDW: 13.6 % (ref 11.5–15.5)
WBC: 9 10*3/uL (ref 4.0–10.5)

## 2021-04-12 MED ORDER — HYDROCORTISONE (PERIANAL) 2.5 % EX CREA: TOPICAL_CREAM | CUTANEOUS | 1 refills | Status: DC

## 2021-04-12 MED ORDER — DICYCLOMINE HCL 10 MG PO CAPS
ORAL_CAPSULE | ORAL | 0 refills | Status: DC
Start: 2021-04-12 — End: 2022-06-20

## 2021-04-12 NOTE — Patient Instructions (Signed)
Your provider has requested that you go to the basement level for lab work before leaving today. Press "B" on the elevator. The lab is located at the first door on the left as you exit the elevator.  Due to recent changes in healthcare laws, you may see the results of your imaging and laboratory studies on MyChart before your provider has had a chance to review them.  We understand that in some cases there may be results that are confusing or concerning to you. Not all laboratory results come back in the same time frame and the provider may be waiting for multiple results in order to interpret others.  Please give [place] 48 hours in order for your provider to thoroughly review all the results before contacting the office for clarification of your results.   We have sent the following medications to your pharmacy for you to pick up at your convenience: Hydrocortisone -use twice daily for a few days and then as needed Dicyclomine   We are giving you a handout to read on hemorrhoids.   You have been scheduled for a CT scan of the abdomen and pelvis at [facility].  You are scheduled on 06/15/21 at 11:30am. You should arrive 15 minutes prior to your appointment time for registration. Please follow the written instructions below on the day of your exam:  WARNING: IF YOU ARE ALLERGIC TO IODINE/X-RAY DYE, PLEASE NOTIFY RADIOLOGY IMMEDIATELY AT [phone]! YOU WILL BE GIVEN A 13 HOUR PREMEDICATION PREP.  1) Do not eat or drink anything after 7:30am (4 hours prior to your test) 2) You have been given 2 bottles of oral contrast to drink. The solution may taste better if refrigerated, but do NOT add ice or any other liquid to this solution. Shake well before drinking.    Drink 1 bottle of contrast @ 9:30am (2 hours prior to your exam)  Drink 1 bottle of contrast @ 10:30am (1 hour prior to your exam)  You may take any medications as prescribed with a small amount of water, if necessary. If you take  any of the following medications: METFORMIN, GLUCOPHAGE, GLUCOVANCE, AVANDAMET, RIOMET, FORTAMET, Nucla MET, JANUMET, GLUMETZA or METAGLIP, you MAY be asked to HOLD this medication 48 hours AFTER the exam.  The purpose of you drinking the oral contrast is to aid in the visualization of your intestinal tract. The contrast solution may cause some diarrhea. Depending on your individual set of symptoms, you may also receive an intravenous injection of x-ray contrast/dye. Plan on being at [facility] for 30 minutes or longer, depending on the type of exam you are having performed.  This test typically takes 30-45 minutes to complete.  If you have any questions regarding your exam or if you need to reschedule, you may call the CT department at [phone]  between the hours of 8:00 am and 5:00 pm, Monday-Friday.  ________________________________________________________________________   I appreciate the opportunity to care for you. Silvano Rusk , MD, [facility]

## 2021-04-12 NOTE — Progress Notes (Signed)
Karina Ferguson 65 y.o. Jan 11, 1956 [id number]  Assessment & Plan:   Encounter Diagnoses  Name Primary?   Rectal bleeding Yes   LLQ pain    Left lower quadrant abdominal tenderness without rebound tenderness    Change in bowel habits    I am confident the bleeding is from hemorrhoids.  I am not sure what the left lower quadrant pain and change in bowel habits is related to.  Question diverticulitis though it does not seem to be too severe on exam she is quite troubled by this so we will proceed with CT scan and treatment and other evaluation as below.  Pending that a colonoscopy may be indicated.  I have reviewed the risks benefits and indications of that today.  Meds ordered this encounter  Medications   dicyclomine (BENTYL) 10 MG capsule    Sig: 1-2 qid prn abdominal cramps    Dispense:  60 capsule    Refill:  0   hydrocortisone (ANUSOL-HC) 2.5 % rectal cream    Sig: Bid prn hemorrhoids    Dispense:  30 g    Refill:  1   Orders Placed This Encounter  Procedures   CT Abdomen Pelvis W Contrast   CBC w/Diff   Comp Met (CMET)    I appreciate the opportunity to care for this patient. CC: Nicola Girt, DO    Subjective:   Chief Complaint: Rectal bleeding abdominal pain change in bowel habits  HPI The patient is a 65 year old African-American woman who noted diarrhea last week, she tends towards constipation typically but had a lot of diarrhea and then had some rectal bleeding with blood on the tissue and in the commode.  She has seen bright red blood on the tissue paper off and on a few times a year when she strains and if she is constipated.  She has had persistent left lower quadrant cramps and tightness that concerned her.  It may worsen if she moves in bed but is not a positional or motion related pain.  She has not had a normal bowel movement in some days she had a very small stool on Saturday which is about 4 days ago.  She had a little bit of bright red blood on  the tissue paper again yesterday.  She reports that she feels like she needs to defecate but when she goes nothing is produced.  She is bloated as well and feels a little distended on the left side.  She had a CT colonoscopy in 2017 that did not show any polyps.  There was an ovarian cyst that she says had follow-up through gynecology and was okay.  She was seen in primary care fairly recently within normal CMET on March 16, 2021 except for GFR of 53.  She had a mildly elevated CK at that time.   Wt Readings from Last 3 Encounters:  04/12/21 235 lb (106.6 kg)  01/10/21 242 lb 8.1 oz (110 kg)  12/20/20 243 lb (110.2 kg)    Allergies  Allergen Reactions   Amoxicillin Shortness Of Breath   Esomeprazole Magnesium Diarrhea   Rabeprazole Diarrhea   Current Meds  Medication Sig   albuterol (VENTOLIN HFA) 108 (90 Base) MCG/ACT inhaler Inhale into the lungs. PRN   amLODipine (NORVASC) 10 MG tablet Take 1 tablet by mouth daily.   cyclobenzaprine (FLEXERIL) 10 MG tablet Take 1 tablet (10 mg total) by mouth 2 (two) times daily as needed for muscle  spasms.   dicyclomine (BENTYL) 10 MG capsule 1-2 qid prn abdominal cramps   gabapentin (NEURONTIN) 100 MG capsule Take by mouth. PRN for back pain   hydrochlorothiazide (HYDRODIURIL) 25 MG tablet Take 25 mg by mouth daily.   hydrocortisone (ANUSOL-HC) 2.5 % rectal cream Bid prn hemorrhoids   metoprolol succinate (TOPROL-XL) 100 MG 24 hr tablet Take 1 tablet by mouth daily.   montelukast (SINGULAIR) 10 MG tablet Take 10 mg by mouth daily.   Multiple Vitamin (MULTI-VITAMIN) tablet Take 1 tablet by mouth daily.   pantoprazole (PROTONIX) 40 MG tablet Take 1 tablet by mouth daily before breakfast.   Past Medical History:  Diagnosis Date   Allergic rhinitis    Anxiety    Chronic idiopathic constipation    GERD (gastroesophageal reflux disease)    Hyperlipidemia    Hypertension    Insomnia    Stage 3 chronic kidney disease (HCC)    Vertigo    Past  Surgical History:  Procedure Laterality Date   THYROIDECTOMY, PARTIAL     Social History   Social History Narrative   She is married, she has 1 son and 1 daughter they are twins and 4 grandchildren.      She is retired from [employer] having done many jobs there.      No alcohol 1 caffeinated beverage a day never smoker and no tobacco.    There is a history of IBS and breast cancer in her family.  Review of Systems As per HPI.  Objective:   Physical Exam BP (!) 142/90   Pulse 76   Ht 5' 9"  (1.753 m)   Wt 235 lb (106.6 kg)   SpO2 95%   BMI 34.70 kg/m  Obese bw NAD Lungs cta Cor NL S1s2 no rmg Abd obese and tender LUQ/LLQ w/ deep palpaption but no mass, HSM  Rectal  Melissa Conger CMA present  There is a palpable right posterior swollen internal hemorrhoid.  The anal canal is edematous and somewhat stenotic.  No mass, small rectocele.  There is no stool in the vault.  Anoscopy is performed and demonstrates a grade 2 inflamed internal hemorrhoid complex with stigmata of bleeding.  The other columns of hemorrhoids are grade 1.

## 2021-04-15 ENCOUNTER — Other Ambulatory Visit: Payer: Self-pay

## 2021-04-15 ENCOUNTER — Ambulatory Visit (HOSPITAL_BASED_OUTPATIENT_CLINIC_OR_DEPARTMENT_OTHER)
Admission: RE | Admit: 2021-04-15 | Discharge: 2021-04-15 | Disposition: A | Discharge status: Home / Self Care | Payer: Medicare Other | Source: Ambulatory Visit | Attending: Internal Medicine | Admitting: Internal Medicine

## 2021-04-15 ENCOUNTER — Encounter (HOSPITAL_BASED_OUTPATIENT_CLINIC_OR_DEPARTMENT_OTHER): Payer: Self-pay

## 2021-04-15 DIAGNOSIS — R10814 Left lower quadrant abdominal tenderness: Secondary | ICD-10-CM | POA: Insufficient documentation

## 2021-04-15 DIAGNOSIS — R1032 Left lower quadrant pain: Secondary | ICD-10-CM | POA: Insufficient documentation

## 2021-04-15 MED ORDER — IOHEXOL 300 MG/ML  SOLN
100.0000 mL | Freq: Once | INTRAMUSCULAR | Status: AC | PRN
  Administered 2021-04-15: 100 mL via INTRAVENOUS

## 2021-04-21 ENCOUNTER — Telehealth: Payer: Self-pay | Admitting: Internal Medicine

## 2021-04-21 NOTE — Telephone Encounter (Signed)
Pt called inquiring about lab and imaging results that are already available in Mychart.

## 2021-04-21 NOTE — Telephone Encounter (Signed)
Let her know we saw ovarian cyst again and a fat-containing inguinal hernia    I am recommending a colonoscopy for LLQ pain, change in bowel habits   Also has rectal bleeding likely from hemorrhoids    This is the result note I placed yesterday (above)  Labs were ok

## 2021-04-21 NOTE — Telephone Encounter (Signed)
See other phone note

## 2021-04-21 NOTE — Telephone Encounter (Signed)
Called patient and gave lab and CT results. Scheduled colonoscopy in [place] on 05/12/21. Scheduled Pre-Visit on 05/04/21, patient to arrive at 2:45 pm

## 2021-05-04 ENCOUNTER — Other Ambulatory Visit: Payer: Self-pay

## 2021-05-04 ENCOUNTER — Ambulatory Visit (AMBULATORY_SURGERY_CENTER): Payer: Self-pay

## 2021-05-04 VITALS — Ht 69.0 in | Wt 234.0 lb

## 2021-05-04 DIAGNOSIS — R194 Change in bowel habit: Secondary | ICD-10-CM

## 2021-05-04 MED ORDER — NA SULFATE-K SULFATE-MG SULF 17.5-3.13-1.6 GM/177ML PO SOLN: 1.0000 | Freq: Once | ORAL | 0 refills | Status: AC

## 2021-05-04 NOTE — Progress Notes (Signed)
Denies allergies to eggs or soy products. Denies complication of anesthesia or sedation. Denies use of weight loss medication. Denies use of O2.   Emmi instructions given for colonoscopy.  

## 2021-05-05 ENCOUNTER — Telehealth: Payer: Self-pay | Admitting: Internal Medicine

## 2021-05-05 NOTE — Telephone Encounter (Signed)
Answered all of the patient's questions at this time.

## 2021-05-05 NOTE — Telephone Encounter (Signed)
Inbound call from patient. Have questions about pre visit notes in chart

## 2021-05-06 ENCOUNTER — Telehealth: Payer: Self-pay | Admitting: Internal Medicine

## 2021-05-06 ENCOUNTER — Encounter: Payer: Self-pay | Admitting: Internal Medicine

## 2021-05-06 DIAGNOSIS — R194 Change in bowel habit: Secondary | ICD-10-CM

## 2021-05-06 MED ORDER — NA SULFATE-K SULFATE-MG SULF 17.5-3.13-1.6 GM/177ML PO SOLN: 1.0000 | ORAL | 0 refills | Status: DC

## 2021-05-06 NOTE — Telephone Encounter (Signed)
Resent Suprep Rx to patient's pharmacy per request.

## 2021-05-09 NOTE — Telephone Encounter (Signed)
Patient called states she advised a nurse her Suprep medication needed prior auth per her pharmacy and she has not heard anything about it. I advised her that we would be able to send an alternative prep. She is requesting to speak with a nurse about it further.

## 2021-05-09 NOTE — Telephone Encounter (Signed)
I returned call to patient.  She has multiple concerns about upcoming procedure and care.  Her Aunt recently died and her funeral was this weekend.  She "is not in the right state of mind to have the procedure. There has been some confusion about the prep, coverage, and she states she never told anyone here that the pharmacy did not have the prep.  She is upset that "people are saying I said that when I did not".  Previous phone notes do not have details about the prep instructions. She is upset that her results were not called to her and she had to call in here to get the results.  We discussed that it was an oversight and that this is not our stand of care to have patients call for results.  She reports that 5 years ago she had a colon in [place] and the MD was not able to complete because "colon was too long".  She was sent to the hospital to complete her work-up.  She wants to discuss this and potential plans if Dr. Carlean Purl is not able to complete.  I cancelled the appointment for 9/15 at her request and have scheduled an office visit on 07/13/21 at 10:50 for her to discuss.

## 2021-05-09 NOTE — Telephone Encounter (Signed)
Sheri,   Please call this patient she has concerns about her upcoming colonoscopy. She also request the colonoscopy be canceled at this time. Please call her today before 5 pm. Thank you, Ares Tegtmeyer

## 2021-05-12 ENCOUNTER — Encounter: Payer: Medicare Other | Admitting: Internal Medicine

## 2021-05-16 ENCOUNTER — Other Ambulatory Visit: Payer: Self-pay | Admitting: Internal Medicine

## 2021-05-16 DIAGNOSIS — R194 Change in bowel habit: Secondary | ICD-10-CM

## 2021-06-03 ENCOUNTER — Encounter: Payer: Medicare Other | Admitting: Internal Medicine

## 2021-06-14 LAB — HM COLONOSCOPY

## 2021-07-13 ENCOUNTER — Ambulatory Visit: Payer: Medicare Other | Admitting: Internal Medicine

## 2021-08-31 ENCOUNTER — Ambulatory Visit (INDEPENDENT_AMBULATORY_CARE_PROVIDER_SITE_OTHER): Payer: Medicare Other

## 2021-08-31 ENCOUNTER — Ambulatory Visit: Payer: Medicare Other | Admitting: Nurse Practitioner

## 2021-08-31 ENCOUNTER — Encounter: Payer: Self-pay | Admitting: Nurse Practitioner

## 2021-08-31 ENCOUNTER — Other Ambulatory Visit: Payer: Self-pay

## 2021-08-31 ENCOUNTER — Ambulatory Visit (INDEPENDENT_AMBULATORY_CARE_PROVIDER_SITE_OTHER): Payer: Medicare Other | Admitting: Nurse Practitioner

## 2021-08-31 ENCOUNTER — Telehealth: Payer: Self-pay | Admitting: Nurse Practitioner

## 2021-08-31 VITALS — BP 142/80 | HR 94 | Temp 99.1°F | Ht 69.0 in | Wt 235.2 lb

## 2021-08-31 DIAGNOSIS — R051 Acute cough: Secondary | ICD-10-CM

## 2021-08-31 DIAGNOSIS — R058 Other specified cough: Secondary | ICD-10-CM | POA: Diagnosis not present

## 2021-08-31 DIAGNOSIS — B9689 Other specified bacterial agents as the cause of diseases classified elsewhere: Secondary | ICD-10-CM

## 2021-08-31 DIAGNOSIS — J019 Acute sinusitis, unspecified: Secondary | ICD-10-CM | POA: Diagnosis not present

## 2021-08-31 MED ORDER — PROMETHAZINE-CODEINE 6.25-10 MG/5ML PO SYRP: 5.0000 mL | ORAL_SOLUTION | Freq: Four times a day (QID) | ORAL | 0 refills | Status: DC | PRN

## 2021-08-31 MED ORDER — AZELASTINE HCL 0.1 % NA SOLN: 1.0000 | Freq: Two times a day (BID) | NASAL | 2 refills | Status: DC

## 2021-08-31 MED ORDER — DOXYCYCLINE HYCLATE 100 MG PO TABS: 100.0000 mg | ORAL_TABLET | Freq: Two times a day (BID) | ORAL | 0 refills | Status: DC

## 2021-08-31 MED ORDER — PREDNISONE 10 MG PO TABS: ORAL_TABLET | ORAL | 0 refills | Status: DC

## 2021-08-31 MED ORDER — ALBUTEROL SULFATE HFA 108 (90 BASE) MCG/ACT IN AERS
1.0000 | INHALATION_SPRAY | RESPIRATORY_TRACT | 2 refills | Status: DC | PRN
Start: 2021-08-31 — End: 2022-09-28

## 2021-08-31 NOTE — Patient Instructions (Addendum)
-  Continue albuterol inhaler 1-2 puffs every 6 hours as needed for shortness of breath or wheezing -Continue Singulair 10 mg At bedtime -Continue protonix 40 mg daily -Continue flonase nasal spray 1-2 sprays each nostril daily  -Saline rinses twice daily until symptoms improve then as needed for congestion  -Mucinex (guaifenesin) 600 mg twice daily as needed until symptoms improve  -Promethazine/codeine cough syrup 5 mL every 6 hours as needed for cough. This may make you drowsy so do not drive or operate heavy machinery while taking.  -Prednisone taper. 4 tabs for 2 days, then 3 tabs for 2 days, 2 tabs for 2 days, then 1 tab for 2 days, then stop -Doxycyline 100 mg Twice daily for 7 days. Notify immediately of any rash, itching, hives, or swelling, or seek emergency care.Finish your antibiotics in their entirety. Do not stop just because symptoms improve. Take with food to reduce GI upset. May increase photosensitivity - avoid direct sun exposure while on this medication. -Astelin nasal spray 1 spray each nostril Twice daily until symptoms improve then as needed for nasal congestion/runny nose   Chest x ray today.   Follow up in three months with Dr. Ander Slade or Alanson Aly. If symptoms do not improve or worsen, please contact office for sooner follow up or seek emergency care.

## 2021-08-31 NOTE — Addendum Note (Signed)
Addended by: Vanessa Barbara on: 08/31/2021 04:08 PM   Modules accepted: Orders

## 2021-08-31 NOTE — Progress Notes (Addendum)
@Patient  ID: Karina Ferguson, female    DOB: 18-Jul-1956, 66 y.o.   MRN: [id number]  Chief Complaint  Patient presents with   Follow-up    Wheezing more than normal, cough with clear mucous for 3 weeks.  Started out with stuffy nose and cough.  Has whistling when she breaths.  Covid negative week before last.  3 total tests.    Referring provider: Nicola Girt, DO  HPI: 66 year old female, never smoker followed for chronic cough and allergic rhinitis.  She is a patient of Dr. Judson Roch and was last seen in office on 12/21/2018.  Past medical history significant for hypertension, GERD, IBS, thyroid disease status post partial thyroidectomy, stage III CKD, history of breast cancer, HLD, obesity, anxiety.  TEST/EVENTS:  12/21/2018 PFTs: FVC 3.12 (101), FEV1 2.39 (99), ratio 77%, TLC 6.05 (mild 4), DLCO uncorrected 23.52 (101), no BD.  Normal lung function and volumes.  12/20/2020: OV with Dr. Ander Slade.  Reported cough is improved and shortness of breath improved.  Continued omeprazole for reflux.  Continued Singulair for allergic rhinitis.  Previous PFTs normal.  08/31/2021: Today - acute visit  Patient presents today for persistent cough and nasal congestion. Her symptoms started 3 weeks ago and she had some improvement after seeing her PCP on 12/16 and being treated with a prednisone pack; however, her symptoms have since worsened. Her cough is productive with clear sputum and she reports noticing an occasional wheeze. She has had some mild shortness of breath with coughing spells. Her nasal congestion is worse with clear to yellow rhinorrhea and she is experiencing associated post nasal drip. She denies any recent fevers or chills. She denies orthopnea, PND, chest pain or lower extremity swelling. She has been taking mucinex DM and tessalon perles without relief in her cough or nasal congestion. She continues on Singulair nightly. Overall, she feels poorly.   Allergies  Allergen Reactions    Amoxicillin Shortness Of Breath   Esomeprazole Magnesium Diarrhea   Rabeprazole Diarrhea    Immunization History  Administered Date(s) Administered   PFIZER Comirnaty(Gray Top)Covid-19 Tri-Sucrose Vaccine 07/19/2020   PFIZER(Purple Top)SARS-COV-2 Vaccination 11/22/2019, 12/13/2019, 07/19/2020   Pneumococcal Conjugate-13 09/16/2020    Past Medical History:  Diagnosis Date   Allergic rhinitis    Allergy    Anemia    Anxiety    Arthritis    Cancer ([place])    Chronic idiopathic constipation    GERD (gastroesophageal reflux disease)    Hyperlipidemia    Hypertension    Insomnia    Stage 3 chronic kidney disease (HCC)    Thyroid disease    Vertigo     Tobacco History: Social History   Tobacco Use  Smoking Status Never  Smokeless Tobacco Never   Counseling given: Not Answered   Outpatient Medications Prior to Visit  Medication Sig Dispense Refill   albuterol (VENTOLIN HFA) 108 (90 Base) MCG/ACT inhaler Inhale into the lungs. PRN     amLODipine (NORVASC) 10 MG tablet Take 1 tablet by mouth daily.     cyclobenzaprine (FLEXERIL) 10 MG tablet Take 1 tablet (10 mg total) by mouth 2 (two) times daily as needed for muscle spasms. 20 tablet 0   dicyclomine (BENTYL) 10 MG capsule 1-2 qid prn abdominal cramps 60 capsule 0   gabapentin (NEURONTIN) 100 MG capsule Take by mouth. PRN for back pain     hydrochlorothiazide (HYDRODIURIL) 25 MG tablet Take 25 mg by mouth daily.     hydrocortisone (ANUSOL-HC) 2.5 % rectal cream  Bid prn hemorrhoids 30 g 1   metoprolol succinate (TOPROL-XL) 100 MG 24 hr tablet Take 1 tablet by mouth daily.     montelukast (SINGULAIR) 10 MG tablet Take 10 mg by mouth daily.     Multiple Vitamin (MULTI-VITAMIN) tablet Take 1 tablet by mouth daily.     Na Sulfate-K Sulfate-Mg Sulf 17.5-3.13-1.6 GM/177ML SOLN Take 1 kit by mouth as directed. 354 mL 0   pantoprazole (PROTONIX) 40 MG tablet Take 1 tablet by mouth daily before breakfast.      pseudoephedrine-guaifenesin (MUCINEX D) 60-600 MG 12 hr tablet Take 1 tablet by mouth every 12 (twelve) hours.     No facility-administered medications prior to visit.     Review of Systems:   Constitutional: No weight loss or gain, night sweats, fevers, chills, fatigue, or lassitude. HEENT: No headaches, difficulty swallowing, tooth/dental problems, or sore throat. No sneezing, itching, ear ache. +nasal congestion, rhinorrhea, postnasal drip CV:  No chest pain, orthopnea, PND, swelling in lower extremities, anasarca, dizziness, palpitations, syncope Resp: +shortness of breath with coughing spells; productive cough with clear sputum; reported occasional wheezing. No hemoptysis. No chest wall deformity GI:  No heartburn, indigestion, abdominal pain, nausea, vomiting, diarrhea, change in bowel habits, loss of appetite, bloody stools.  GU: No dysuria, change in color of urine, urgency or frequency.  No flank pain, no hematuria  Skin: No rash, lesions, ulcerations MSK:  No joint pain or swelling.  No decreased range of motion.  No back pain. Neuro: No dizziness or lightheadedness.  Psych: No depression or anxiety. Mood stable.     Physical Exam:  BP (!) 142/80 (BP Location: Left Arm, Patient Position: Sitting, Cuff Size: Normal)    Pulse 94    Temp 99.1 F (37.3 C) (Oral)    Ht 5' 9"  (1.753 m)    Wt 235 lb 3.2 oz (106.7 kg)    SpO2 98%    BMI 34.73 kg/m   GEN: Pleasant, interactive, well-appearing; obese; in no acute distress. HEENT:  Normocephalic and atraumatic. EACs patent bilaterally. TM pearly gray with present light reflex bilaterally. PERRLA. Sclera white. Nasal turbinates pink, moist and patent bilaterally. Clear rhinorrhea present. Oropharynx erythematous and moist, without exudate or edema. No lesions, ulcerations NECK:  Supple w/ fair ROM. No JVD present. Normal carotid impulses w/o bruits. Thyroid symmetrical with no goiter or nodules palpated. No lymphadenopathy.   CV: RRR, no  m/r/g, no peripheral edema. Pulses intact, +2 bilaterally. No cyanosis, pallor or clubbing. PULMONARY:  Unlabored, regular breathing. Clear bilaterally A&P w/o wheezes/rales/rhonchi. No accessory muscle use. No dullness to percussion. GI: BS present and normoactive. Soft, non-tender to palpation. No organomegaly or masses detected. No CVA tenderness. MSK: No erythema, warmth or tenderness. Cap refil <2 sec all extrem. No deformities or joint swelling noted.  Neuro: A/Ox3. No focal deficits noted.   Skin: Warm, no lesions or rashe Psych: Normal affect and behavior. Judgement and thought content appropriate.     Lab Results:  CBC    Component Value Date/Time   WBC 9.0 04/12/2021 1449   RBC 4.63 04/12/2021 1449   HGB 13.7 04/12/2021 1449   HCT 40.9 04/12/2021 1449   PLT 266.0 04/12/2021 1449   MCV 88.2 04/12/2021 1449   MCH 29.3 01/10/2021 1100   MCHC 33.5 04/12/2021 1449   RDW 13.6 04/12/2021 1449   LYMPHSABS 2.1 04/12/2021 1449   MONOABS 0.8 04/12/2021 1449   EOSABS 0.0 04/12/2021 1449   BASOSABS 0.1 04/12/2021 1449  BMET    Component Value Date/Time   NA 136 04/12/2021 1449   K 3.5 04/12/2021 1449   CL 99 04/12/2021 1449   CO2 26 04/12/2021 1449   GLUCOSE 97 04/12/2021 1449   BUN 11 04/12/2021 1449   CREATININE 1.02 04/12/2021 1449   CALCIUM 10.5 04/12/2021 1449   GFRNONAA 52 (L) 01/10/2021 1100   GFRAA (L) 07/25/2009 1243    56        The eGFR has been calculated using the MDRD equation. This calculation has not been validated in all clinical situations. eGFR's persistently <60 mL/min signify possible Chronic Kidney Disease.    BNP No results found for: BNP   Imaging:  08/31/2021: CXR today reviewed by me with both lungs clear and no active cardiopulmonary disease.   DG Chest 2 View  Result Date: 08/31/2021 CLINICAL DATA:  Cough. EXAM: CHEST - 2 VIEW COMPARISON:  11/05/2020 FINDINGS: The heart size and mediastinal contours are within normal limits. Both  lungs are clear. The visualized skeletal structures are unremarkable. IMPRESSION: No active cardiopulmonary disease. Electronically Signed   By: Kerby Moors M.D.   On: 08/31/2021 11:46      PFT Results Latest Ref Rng & Units 12/20/2020  FVC-Pre L 2.91  FVC-Predicted Pre % 94  FVC-Post L 3.12  FVC-Predicted Post % 101  Pre FEV1/FVC % % 81  Post FEV1/FCV % % 77  FEV1-Pre L 2.35  FEV1-Predicted Pre % 97  FEV1-Post L 2.39  DLCO uncorrected ml/min/mmHg 23.52  DLCO UNC% % 101  DLCO corrected ml/min/mmHg 23.52  DLCO COR %Predicted % 101  DLVA Predicted % 132  TLC L 6.05  TLC % Predicted % 104  RV % Predicted % 119    No results found for: NITRICOXIDE      Assessment & Plan:   Acute bacterial rhinosinusitis Persistent symptoms with initial improvement, now worsening. Given this and length of symptoms >10 days, will treat for bacterial coinfection with doxycyline Twice daily for 7 days. Astelin and flonase nasal sprays. Continue singulair. Change to regular mucinex. Supportive care.   Patient Instructions  -Continue albuterol inhaler 1-2 puffs every 6 hours as needed for shortness of breath or wheezing -Continue Singulair 10 mg At bedtime -Continue protonix 40 mg daily -Continue flonase nasal spray 1-2 sprays each nostril daily  -Saline rinses twice daily until symptoms improve then as needed for congestion  -Mucinex (guaifenesin) 600 mg twice daily as needed until symptoms improve  -Promethazine/codeine cough syrup 5 mL every 6 hours as needed for cough. This may make you drowsy so do not drive or operate heavy machinery while taking.  -Prednisone taper. 4 tabs for 2 days, then 3 tabs for 2 days, 2 tabs for 2 days, then 1 tab for 2 days, then stop -Doxycyline 100 mg Twice daily for 7 days. Notify immediately of any rash, itching, hives, or swelling, or seek emergency care.Finish your antibiotics in their entirety. Do not stop just because symptoms improve. Take with food to  reduce GI upset. May increase photosensitivity - avoid direct sun exposure while on this medication. -Astelin nasal spray 1 spray each nostril Twice daily until symptoms improve then as needed for nasal congestion/runny nose   Chest x ray today.   Follow up in three months with Dr. Ander Slade or Alanson Aly. If symptoms do not improve or worsen, please contact office for sooner follow up or seek emergency care.    Upper airway cough syndrome Lungs clear to auscultation on exam.  Suspect cough likely related to upper airway irritation from postnasal drip. Will tx with prednisone taper. Promethazine/codeine cough syrup short term. Continue supportive care measures. CXR today to rule out underlying etiology - clear today. See above.    Clayton Bibles, NP 08/31/2021  Pt aware and understands NP's role.

## 2021-08-31 NOTE — Assessment & Plan Note (Addendum)
Lungs clear to auscultation on exam. Suspect cough likely related to upper airway irritation from postnasal drip. Will tx with prednisone taper. Promethazine/codeine cough syrup short term. Continue supportive care measures. CXR today to rule out underlying etiology - clear today. See above.

## 2021-08-31 NOTE — Telephone Encounter (Signed)
I called and spoke with [facility] and per the pharmacy they no longer carry Phenergan with Codeine. I called the patient and she wants to have it sent to [facility]. Please advise.

## 2021-08-31 NOTE — Assessment & Plan Note (Signed)
Persistent symptoms with initial improvement, now worsening. Given this and length of symptoms >10 days, will treat for bacterial coinfection with doxycyline Twice daily for 7 days. Astelin and flonase nasal sprays. Continue singulair. Change to regular mucinex. Supportive care.   Patient Instructions  -Continue albuterol inhaler 1-2 puffs every 6 hours as needed for shortness of breath or wheezing -Continue Singulair 10 mg At bedtime -Continue protonix 40 mg daily -Continue flonase nasal spray 1-2 sprays each nostril daily  -Saline rinses twice daily until symptoms improve then as needed for congestion  -Mucinex (guaifenesin) 600 mg twice daily as needed until symptoms improve  -Promethazine/codeine cough syrup 5 mL every 6 hours as needed for cough. This may make you drowsy so do not drive or operate heavy machinery while taking.  -Prednisone taper. 4 tabs for 2 days, then 3 tabs for 2 days, 2 tabs for 2 days, then 1 tab for 2 days, then stop -Doxycyline 100 mg Twice daily for 7 days. Notify immediately of any rash, itching, hives, or swelling, or seek emergency care.Finish your antibiotics in their entirety. Do not stop just because symptoms improve. Take with food to reduce GI upset. May increase photosensitivity - avoid direct sun exposure while on this medication. -Astelin nasal spray 1 spray each nostril Twice daily until symptoms improve then as needed for nasal congestion/runny nose   Chest x ray today.   Follow up in three months with Dr. Ander Slade or Alanson Aly. If symptoms do not improve or worsen, please contact office for sooner follow up or seek emergency care.

## 2021-09-07 ENCOUNTER — Other Ambulatory Visit: Payer: Self-pay | Admitting: Nurse Practitioner

## 2021-09-07 ENCOUNTER — Telehealth: Payer: Self-pay | Admitting: Nurse Practitioner

## 2021-09-07 DIAGNOSIS — J019 Acute sinusitis, unspecified: Secondary | ICD-10-CM

## 2021-09-07 DIAGNOSIS — R051 Acute cough: Secondary | ICD-10-CM

## 2021-09-07 NOTE — Telephone Encounter (Signed)
Glad to hear she has improved! If she is only experiencing occasional cough, it is likely post viral cough and just needs time to fully recover. I would not extend her doxycycline or prednisone at this time, given her improvement. Since it's 90% improved, she can use OTC delsym or mucinex DM for the cough. Advise her to continue with the nasal sprays that we ordered. Thanks!

## 2021-09-07 NOTE — Telephone Encounter (Signed)
Spoke with pt  She finished pred taper and doxy given 08/31/21  She feels much better and states cough is "90% improved" She has occ cough "maybe 2-3 x per day" with min clear sputum  She states she is taking mucinex for the cough bid  She wonders if she had more doxy if this would knock it out  She never got rx for phenergan w codiene bc [facility] was out  She wants to know if we can send this to [facility]  Please advise thanks!  Allergies  Allergen Reactions   Amoxicillin Shortness Of Breath   Esomeprazole Magnesium Diarrhea   Rabeprazole Diarrhea

## 2021-09-07 NOTE — Telephone Encounter (Signed)
Spoke with the pt and notified of response per [place]. She verbalized understanding. Nothing further needed.

## 2021-09-12 ENCOUNTER — Ambulatory Visit: Payer: Medicare Other | Admitting: Pulmonary Disease

## 2021-09-13 ENCOUNTER — Telehealth: Payer: Self-pay | Admitting: Pulmonary Disease

## 2021-09-13 NOTE — Telephone Encounter (Signed)
Called and spoke with patient. She stated that per her husband, her snoring has increased over the past few months. She wanted to know if [facility] would be willing to order a sleep study for her. I reviewed her chart and it looks she has never discussed any sleep related with AO before.  AO, can you please advise? Thanks!

## 2021-09-14 NOTE — Telephone Encounter (Signed)
Called and spoke with pt letting her know the info per [facility] that we needed to get her in for an appt to further have her snoring addressed prior to [place] able to place order for [facility]ST. Pt verbalized understanding. Appt has been scheduled for pt with [place]. Nothing further needed.

## 2021-09-14 NOTE — Telephone Encounter (Signed)
Schedule her to come in for an appointment  May see myself or one of the APP's

## 2021-10-04 ENCOUNTER — Other Ambulatory Visit: Payer: Self-pay

## 2021-10-04 ENCOUNTER — Encounter: Payer: Self-pay | Admitting: Nurse Practitioner

## 2021-10-04 ENCOUNTER — Ambulatory Visit (INDEPENDENT_AMBULATORY_CARE_PROVIDER_SITE_OTHER): Payer: Medicare Other | Admitting: Nurse Practitioner

## 2021-10-04 VITALS — BP 122/72 | HR 74 | Temp 98.2°F | Ht 69.0 in | Wt 235.0 lb

## 2021-10-04 DIAGNOSIS — R0683 Snoring: Secondary | ICD-10-CM | POA: Diagnosis not present

## 2021-10-04 DIAGNOSIS — J309 Allergic rhinitis, unspecified: Secondary | ICD-10-CM | POA: Insufficient documentation

## 2021-10-04 DIAGNOSIS — Z23 Encounter for immunization: Secondary | ICD-10-CM

## 2021-10-04 DIAGNOSIS — R058 Other specified cough: Secondary | ICD-10-CM

## 2021-10-04 NOTE — Assessment & Plan Note (Signed)
Suspect related to postnasal drip from chronic rhinitis.  Minimal current cough.  We will continue with current regimen.

## 2021-10-04 NOTE — Progress Notes (Signed)
@Patient  ID: Karina Ferguson, female    DOB: 05/21/1956, 66 y.o.   MRN: [id number]  Chief Complaint  Patient presents with   Follow-up    Pt is her to discuss her snoring.     Referring provider: Nicola Girt, DO  HPI: 66 year old female, never smoker follow-up for chronic cough and allergic rhinitis.  She is a patient Dr. Judson Roch and was last seen in office on 08/31/2021 by NP.  Past medical history significant for hypertension, GERD, IBS, thyroid disease status post partial thyroidectomy, stage III CKD, history of breast cancer, HLD, obesity, anxiety.  TEST/EVENTS:  12/21/2018 PFTs: FVC 3.12 (101), FEV1 2.39 (99), ratio 77%, DLCO 101%, no BD.  Normal lung function and volumes.  08/31/2021: OV with Antonie Borjon NP.  Previously treated by PCP with prednisone pack for increased cough and nasal congestion.  Had worsening symptoms since coming off prednisone.  Treated with supportive care, prednisone taper and doxycycline.  Added Astelin nasal spray twice daily.  CXR clear.  10/04/2021: Today-acute visit Patient presents today with husband for reports of snoring.  They state that this has been ongoing for a long time.  He reports that she does occasionally have some episodes where she sounds like she stops breathing.  She goes to bed around 12 and wakes around 8 or 9 AM, waking once or twice at night to use the restroom.  Despite getting 8 to 9 hours of sleep at night, she wakes feeling tired in the morning.  She does occasionally have some morning headaches and has daytime fatigue symptoms.  She denies drowsy driving, narcolepsy, shortness of breath.  Her nasal congestion has improved.  She has never had a sleep study before.  [facility]  Allergies  Allergen Reactions   Amoxicillin Shortness Of Breath   Esomeprazole Magnesium Diarrhea   Rabeprazole Diarrhea    Immunization History  Administered Date(s) Administered   PFIZER Comirnaty(Gray Top)Covid-19 Tri-Sucrose Vaccine 07/19/2020    PFIZER(Purple Top)SARS-COV-2 Vaccination 11/22/2019, 12/13/2019, 07/19/2020   Pneumococcal Conjugate-13 09/16/2020   Pneumococcal Polysaccharide-23 10/04/2021    Past Medical History:  Diagnosis Date   Allergic rhinitis    Allergy    Anemia    Anxiety    Arthritis    Cancer (HCC)    Chronic idiopathic constipation    GERD (gastroesophageal reflux disease)    Hyperlipidemia    Hypertension    Insomnia    Stage 3 chronic kidney disease (HCC)    Thyroid disease    Vertigo     Tobacco History: Social History   Tobacco Use  Smoking Status Never  Smokeless Tobacco Never   Counseling given: Not Answered   Outpatient Medications Prior to Visit  Medication Sig Dispense Refill   albuterol (VENTOLIN HFA) 108 (90 Base) MCG/ACT inhaler Inhale 1-2 puffs into the lungs every 4 (four) hours as needed for wheezing or shortness of breath. PRN 18 g 2   amLODipine (NORVASC) 10 MG tablet Take 1 tablet by mouth daily.     azelastine (ASTELIN) 0.1 % nasal spray Place 1 spray into both nostrils 2 (two) times daily. Use in each nostril as directed 30 mL 2   cyclobenzaprine (FLEXERIL) 10 MG tablet Take 1 tablet (10 mg total) by mouth 2 (two) times daily as needed for muscle spasms. 20 tablet 0   dicyclomine (BENTYL) 10 MG capsule 1-2 qid prn abdominal cramps 60 capsule 0   doxycycline (VIBRA-TABS) 100 MG tablet Take 1 tablet (100 mg total) by mouth 2 (two)  times daily. 14 tablet 0   gabapentin (NEURONTIN) 100 MG capsule Take by mouth. PRN for back pain     hydrochlorothiazide (HYDRODIURIL) 25 MG tablet Take 25 mg by mouth daily.     hydrocortisone (ANUSOL-HC) 2.5 % rectal cream Bid prn hemorrhoids 30 g 1   metoprolol succinate (TOPROL-XL) 100 MG 24 hr tablet Take 1 tablet by mouth daily.     montelukast (SINGULAIR) 10 MG tablet Take 10 mg by mouth daily.     Multiple Vitamin (MULTI-VITAMIN) tablet Take 1 tablet by mouth daily.     Na Sulfate-K Sulfate-Mg Sulf 17.5-3.13-1.6 GM/177ML SOLN Take 1  kit by mouth as directed. 354 mL 0   pantoprazole (PROTONIX) 40 MG tablet Take 1 tablet by mouth daily before breakfast.     predniSONE (DELTASONE) 10 MG tablet 4 tabs for 2 days, then 3 tabs for 2 days, 2 tabs for 2 days, then 1 tab for 2 days, then stop 20 tablet 0   promethazine-codeine (PHENERGAN WITH CODEINE) 6.25-10 MG/5ML syrup Take 5 mLs by mouth every 6 (six) hours as needed for cough. 180 mL 0   pseudoephedrine-guaifenesin (MUCINEX D) 60-600 MG 12 hr tablet Take 1 tablet by mouth every 12 (twelve) hours.     No facility-administered medications prior to visit.     Review of Systems:   Constitutional: No weight loss or gain, night sweats, fevers, chills. +daytime fatigue HEENT: No difficulty swallowing, tooth/dental problems, or sore throat. No sneezing, itching, ear ache,. +improved nasal congestion, only occasional; snoring; morning headaches CV:  No chest pain, orthopnea, PND, swelling in lower extremities, anasarca, dizziness, palpitations, syncope Resp: +minimal dry cough (improved). No shortness of breath with exertion or at rest. No excess mucus or change in color of mucus. No hemoptysis. No wheezing.  No chest wall deformity GI:  No heartburn, indigestion, abdominal pain, nausea, vomiting, diarrhea, change in bowel habits, loss of appetite, bloody stools.  GU: No dysuria, change in color of urine, urgency or frequency.  No flank pain, no hematuria  Skin: No rash, lesions, ulcerations MSK:  No joint pain or swelling.  No decreased range of motion.  No back pain. Neuro: No dizziness or lightheadedness.  Psych: No depression or anxiety. Mood stable.     Physical Exam:  BP 122/72 (BP Location: Left Arm, Patient Position: Sitting, Cuff Size: Normal)    Pulse 74    Temp 98.2 F (36.8 C) (Oral)    Ht 5' 9"  (1.753 m)    Wt 235 lb (106.6 kg)    SpO2 100%    BMI 34.70 kg/m   GEN: Pleasant, interactive, well-appearing; obese; in no acute distress. HEENT:  Normocephalic and  atraumatic. EACs patent bilaterally. TM pearly gray with present light reflex bilaterally. PERRLA. Sclera white. Nasal turbinates pink, moist and patent bilaterally. No rhinorrhea present. Oropharynx pink and moist, without exudate or edema. No lesions, ulcerations, or postnasal drip.  NECK:  Supple w/ fair ROM. No JVD present. Normal carotid impulses w/o bruits. Thyroid symmetrical with no goiter or nodules palpated. No lymphadenopathy.   CV: RRR, no m/r/g, no peripheral edema. Pulses intact, +2 bilaterally. No cyanosis, pallor or clubbing. PULMONARY:  Unlabored, regular breathing. Clear bilaterally A&P w/o wheezes/rales/rhonchi. No accessory muscle use. No dullness to percussion. GI: BS present and normoactive. Soft, non-tender to palpation. No organomegaly or masses detected. No CVA tenderness. MSK: No erythema, warmth or tenderness. Cap refil <2 sec all extrem. No deformities or joint swelling noted.  Neuro: A/Ox3. No  focal deficits noted.   Skin: Warm, no lesions or rashe Psych: Normal affect and behavior. Judgement and thought content appropriate.     Lab Results:  CBC    Component Value Date/Time   WBC 9.0 04/12/2021 1449   RBC 4.63 04/12/2021 1449   HGB 13.7 04/12/2021 1449   HCT 40.9 04/12/2021 1449   PLT 266.0 04/12/2021 1449   MCV 88.2 04/12/2021 1449   MCH 29.3 01/10/2021 1100   MCHC 33.5 04/12/2021 1449   RDW 13.6 04/12/2021 1449   LYMPHSABS 2.1 04/12/2021 1449   MONOABS 0.8 04/12/2021 1449   EOSABS 0.0 04/12/2021 1449   BASOSABS 0.1 04/12/2021 1449    BMET    Component Value Date/Time   NA 136 04/12/2021 1449   K 3.5 04/12/2021 1449   CL 99 04/12/2021 1449   CO2 26 04/12/2021 1449   GLUCOSE 97 04/12/2021 1449   BUN 11 04/12/2021 1449   CREATININE 1.02 04/12/2021 1449   CALCIUM 10.5 04/12/2021 1449   GFRNONAA 52 (L) 01/10/2021 1100   GFRAA (L) 07/25/2009 1243    56        The eGFR has been calculated using the MDRD equation. This calculation has not  been validated in all clinical situations. eGFR's persistently <60 mL/min signify possible Chronic Kidney Disease.    BNP No results found for: BNP   Imaging:  No results found.    PFT Results Latest Ref Rng & Units 12/20/2020  FVC-Pre L 2.91  FVC-Predicted Pre % 94  FVC-Post L 3.12  FVC-Predicted Post % 101  Pre FEV1/FVC % % 81  Post FEV1/FCV % % 77  FEV1-Pre L 2.35  FEV1-Predicted Pre % 97  FEV1-Post L 2.39  DLCO uncorrected ml/min/mmHg 23.52  DLCO UNC% % 101  DLCO corrected ml/min/mmHg 23.52  DLCO COR %Predicted % 101  DLVA Predicted % 132  TLC L 6.05  TLC % Predicted % 104  RV % Predicted % 119    No results found for: NITRICOXIDE      Assessment & Plan:   Snoring Ongoing issues with snoring, occasional apneic events per husband.  Experiences daytime fatigue symptoms and occasional morning headaches.  No drowsy driving.  HST ordered today for further evaluation of sleep disordered breathing.  We will follow-up after test to discuss neck steps and possible treatment options.  Patient Instructions  -Continue albuterol inhaler 1-2 puffs every 6 hours as needed for shortness of breath or wheezing -Continue Singulair 10 mg At bedtime -Continue protonix 40 mg daily -Continue flonase nasal spray 1-2 sprays each nostril daily -Continue Saline rinses twice daily as needed for congestion  -Continue Astelin nasal spray 1 spray each nostril Twice daily as needed for nasal congestion/runny nose    Home sleep study ordered today. Someone will contact you for scheduling  PPSV23 (pneumonia vaccine) given today.    Follow up after sleep study with Dr. Ander Slade or Alanson Aly. If symptoms do not improve or worsen, please contact office for sooner follow up or seek emergency care.    Allergic rhinitis Improved since last visit.  Occasional nasal congestion, relieved with current treatment regimen.  See above plan  Upper airway cough syndrome Suspect related to  postnasal drip from chronic rhinitis.  Minimal current cough.  We will continue with current regimen.   Clayton Bibles, NP 10/04/2021  Pt aware and understands NP's role.

## 2021-10-04 NOTE — Assessment & Plan Note (Signed)
Improved since last visit.  Occasional nasal congestion, relieved with current treatment regimen.  See above plan

## 2021-10-04 NOTE — Assessment & Plan Note (Signed)
Ongoing issues with snoring, occasional apneic events per husband.  Experiences daytime fatigue symptoms and occasional morning headaches.  No drowsy driving.  HST ordered today for further evaluation of sleep disordered breathing.  We will follow-up after test to discuss neck steps and possible treatment options.  Patient Instructions  -Continue albuterol inhaler 1-2 puffs every 6 hours as needed for shortness of breath or wheezing -Continue Singulair 10 mg At bedtime -Continue protonix 40 mg daily -Continue flonase nasal spray 1-2 sprays each nostril daily -Continue Saline rinses twice daily as needed for congestion  -Continue Astelin nasal spray 1 spray each nostril Twice daily as needed for nasal congestion/runny nose    Home sleep study ordered today. Someone will contact you for scheduling  PPSV23 (pneumonia vaccine) given today.    Follow up after sleep study with Dr. Ander Slade or Alanson Aly. If symptoms do not improve or worsen, please contact office for sooner follow up or seek emergency care.

## 2021-10-04 NOTE — Patient Instructions (Signed)
-  Continue albuterol inhaler 1-2 puffs every 6 hours as needed for shortness of breath or wheezing -Continue Singulair 10 mg At bedtime -Continue protonix 40 mg daily -Continue flonase nasal spray 1-2 sprays each nostril daily -Continue Saline rinses twice daily as needed for congestion  -Continue Astelin nasal spray 1 spray each nostril Twice daily as needed for nasal congestion/runny nose    Home sleep study ordered today. Someone will contact you for scheduling  PPSV23 (pneumonia vaccine) given today.    Follow up after sleep study with Dr. Ander Slade or Alanson Aly. If symptoms do not improve or worsen, please contact office for sooner follow up or seek emergency care.

## 2021-12-02 ENCOUNTER — Ambulatory Visit: Payer: Medicare Other | Admitting: Pulmonary Disease

## 2021-12-30 ENCOUNTER — Encounter: Payer: Self-pay | Admitting: Nurse Practitioner

## 2022-02-01 ENCOUNTER — Ambulatory Visit: Payer: Medicare Other

## 2022-02-01 DIAGNOSIS — R0683 Snoring: Secondary | ICD-10-CM

## 2022-02-01 DIAGNOSIS — G4733 Obstructive sleep apnea (adult) (pediatric): Secondary | ICD-10-CM

## 2022-02-03 DIAGNOSIS — G4733 Obstructive sleep apnea (adult) (pediatric): Secondary | ICD-10-CM

## 2022-02-08 ENCOUNTER — Telehealth: Payer: Self-pay | Admitting: Nurse Practitioner

## 2022-02-10 NOTE — Telephone Encounter (Signed)
Called patient but she did not answer. Left message for her to call back.  

## 2022-02-10 NOTE — Telephone Encounter (Signed)
Called and spoke with patient. She verbalized understanding of results. She has been scheduled for an OV with Katie on 06/22 at 11am.   Nothing further needed at time of call.

## 2022-02-10 NOTE — Telephone Encounter (Signed)
Patient is returning phone call. Patient phone number is [phone]

## 2022-02-16 ENCOUNTER — Ambulatory Visit: Payer: Medicare Other | Admitting: Nurse Practitioner

## 2022-02-17 ENCOUNTER — Ambulatory Visit (INDEPENDENT_AMBULATORY_CARE_PROVIDER_SITE_OTHER): Payer: Medicare Other | Admitting: Nurse Practitioner

## 2022-02-17 ENCOUNTER — Encounter: Payer: Self-pay | Admitting: Nurse Practitioner

## 2022-02-17 VITALS — BP 138/86 | HR 80 | Temp 98.7°F | Ht 69.0 in | Wt 239.0 lb

## 2022-02-17 DIAGNOSIS — R0683 Snoring: Secondary | ICD-10-CM | POA: Diagnosis not present

## 2022-02-17 DIAGNOSIS — E66812 Obesity, class 2: Secondary | ICD-10-CM

## 2022-02-17 DIAGNOSIS — J309 Allergic rhinitis, unspecified: Secondary | ICD-10-CM

## 2022-02-17 DIAGNOSIS — G4733 Obstructive sleep apnea (adult) (pediatric): Secondary | ICD-10-CM

## 2022-02-17 DIAGNOSIS — E6609 Other obesity due to excess calories: Secondary | ICD-10-CM

## 2022-02-17 DIAGNOSIS — Z6835 Body mass index (BMI) 35.0-35.9, adult: Secondary | ICD-10-CM

## 2022-02-17 DIAGNOSIS — E669 Obesity, unspecified: Secondary | ICD-10-CM | POA: Diagnosis not present

## 2022-02-17 NOTE — Assessment & Plan Note (Signed)
Moderate OSA with AHI 23.8/h. We discussed how untreated sleep apnea puts an individual at risk for cardiac arrhthymias, pulm HTN, DM, stroke and increases their risk for daytime accidents. We also briefly reviewed treatment options including weight loss, side sleeping position, oral appliance, CPAP therapy or referral to ENT for possible surgical options.   She was agreeable to CPAP therapy; orders sent for new start 5-15 cmH2O with mask of choice and heated humidification. She would also like to try measures for weight loss to see if she can decrease the severity of her OSA. Referred to medical weight management today.   Patient Instructions  -Continue albuterol inhaler 1-2 puffs every 6 hours as needed for shortness of breath or wheezing -Continue Singulair 10 mg At bedtime -Continue protonix 40 mg daily -Continue flonase nasal spray 1-2 sprays each nostril daily -Continue Saline rinses twice daily as needed for congestion  -Continue Astelin nasal spray 1 spray each nostril Twice daily as needed  Start to use CPAP auto 5-15 cmH2O every night, minimum of 4-6 hours a night.  Change equipment every 30 days or as directed by DME. Wash your tubing with warm soap and water daily, hang to dry. Wash humidifier portion weekly.  Be aware of reduced alertness and do not drive or operate heavy machinery if experiencing this or drowsiness.  Healthy weight management discussed - referred to medical weight management today Avoid or decrease alcohol consumption and medications that make you more sleepy, if possible. Notify if persistent daytime sleepiness occurs even with consistent use of CPAP.  Follow up in 12 weeks with Dr. Wynona Neat or Philis Nettle. If symptoms do not improve or worsen, please contact office for sooner follow up or seek emergency care.

## 2022-02-17 NOTE — Assessment & Plan Note (Signed)
Stable  Continue current regimen  

## 2022-02-23 ENCOUNTER — Telehealth: Payer: Self-pay | Admitting: Nurse Practitioner

## 2022-02-23 DIAGNOSIS — R0683 Snoring: Secondary | ICD-10-CM

## 2022-02-23 NOTE — Telephone Encounter (Signed)
Called patient and she states that she wants her cpap sent to another DME. NOT [facility]. Looked at order and Ty stated that it was supposed to go to [place]. Redid cpap order for Katie to sign. Nothing further needed

## 2022-04-28 DIAGNOSIS — C801 Malignant (primary) neoplasm, unspecified: Secondary | ICD-10-CM

## 2022-04-28 HISTORY — DX: Malignant (primary) neoplasm, unspecified: C80.1

## 2022-05-11 ENCOUNTER — Other Ambulatory Visit: Payer: Self-pay | Admitting: Internal Medicine

## 2022-05-11 DIAGNOSIS — R928 Other abnormal and inconclusive findings on diagnostic imaging of breast: Secondary | ICD-10-CM

## 2022-05-23 ENCOUNTER — Encounter: Payer: Self-pay | Admitting: Nurse Practitioner

## 2022-05-23 ENCOUNTER — Ambulatory Visit (INDEPENDENT_AMBULATORY_CARE_PROVIDER_SITE_OTHER): Payer: Medicare Other | Admitting: Nurse Practitioner

## 2022-05-23 VITALS — BP 124/78 | HR 70 | Temp 98.6°F | Ht 69.0 in | Wt 242.4 lb

## 2022-05-23 DIAGNOSIS — Z9989 Dependence on other enabling machines and devices: Secondary | ICD-10-CM

## 2022-05-23 DIAGNOSIS — G4733 Obstructive sleep apnea (adult) (pediatric): Secondary | ICD-10-CM | POA: Diagnosis not present

## 2022-05-23 DIAGNOSIS — J309 Allergic rhinitis, unspecified: Secondary | ICD-10-CM

## 2022-05-23 NOTE — Assessment & Plan Note (Signed)
Well-controlled on current regimen.  She will continue Singulair and Flonase.

## 2022-05-23 NOTE — Patient Instructions (Addendum)
-  Continue albuterol inhaler 1-2 puffs every 6 hours as needed for shortness of breath or wheezing -Continue Singulair 10 mg At bedtime -Continue protonix 40 mg daily -Continue flonase nasal spray 1-2 sprays each nostril daily -Continue Saline rinses twice daily as needed for congestion  -Continue Astelin nasal spray 1 spray each nostril Twice daily as needed   Continue CPAP every night, minimum of 4-6 hours a night.  Change equipment every 30 days or as directed by DME. Wash your tubing with warm soap and water daily, hang to dry. Wash humidifier portion weekly.  Be aware of reduced alertness and do not drive or operate heavy machinery if experiencing this or drowsiness.   Adjust CPAP to 8-16 cmH2O; will send for a nasal pillow mask to try    Follow up in 4 weeks with Dr. Ander Slade or Alanson Aly. If symptoms do not improve or worsen, please contact office for sooner follow up or seek emergency care.

## 2022-05-23 NOTE — Progress Notes (Signed)
_0  ID: Karina Ferguson, female    DOB: 12/25/1955, 67 y.o.   MRN: [id number]  Chief Complaint  Patient presents with   Follow-up    Wearing CPAP-doing ok, sob/ cough occass. dry    Referring provider: Nicola Girt, DO  HPI: 66 year old female, never smoker follow-up for chronic cough and allergic rhinitis.  She is a patient Dr. Judson Roch and was last seen in office on 02/17/2022 by  [facility] NP.  Past medical history significant for hypertension, GERD, IBS, thyroid disease status post partial thyroidectomy, stage III CKD, history of breast cancer, HLD, obesity, anxiety.  TEST/EVENTS:  12/21/2018 PFTs: FVC 3.12 (101), FEV1 2.39 (99), ratio 77%, DLCO 101%, no BD.  Normal lung function and volumes. 02/02/2022 HST: AHI 23.8/h, SpO2 83%  08/31/2021: OV with Loyalty Arentz NP.  Previously treated by PCP with prednisone pack for increased cough and nasal congestion.  Had worsening symptoms since coming off prednisone.  Treated with supportive care, prednisone taper and doxycycline.  Added Astelin nasal spray twice daily.  CXR clear.  10/04/2021: OV with Dashawna Delbridge NP for reports of snoring.  They state that this has been ongoing for a long time.  He reports that she does occasionally have some episodes where she sounds like she stops breathing.  She goes to bed around 12 and wakes around 8 or 9 AM, waking once or twice at night to use the restroom.  Despite getting 8 to 9 hours of sleep at night, she wakes feeling tired in the morning.  She does occasionally have some morning headaches and has daytime fatigue symptoms.  She denies drowsy driving, narcolepsy, shortness of breath.  Her nasal congestion has improved.  She has never had a sleep study before. [place] 7. HST ordered. Breathing stable - no changes to maintenance medications.   02/17/2022: OV with Latifah Padin NP for follow up to discuss home sleep study results which showed moderate obstructive sleep apnea. She continues to snore at night and wakes up feeling like she  didn't rest well. She also wakes up frequently at night. She denies narcolepsy or drowsy driving. She did get some nasal patches, which have helped some with her snoring but she still feels fatigued. Breathing and cough are stable.  Started on CPAP therapy 5 to 15 cm of water with mask of choice and heated modification.  Referred to medical weight management for weight loss.  05/23/2022: Today-follow-up Patient presents today for follow-up after being started on CPAP therapy.  She reports that she has been doing pretty well with CPAP.  Feels like she is starting to adjust to it.  She does not love wearing it at night but understands that she needs to.  She does feel like she is sleeping better at night.  Her husband has not noticed her snoring.  She wakes up in the morning feeling better rested.  Denies any morning headaches or drowsy driving.  She does notice that she has some occasional mask leaks.  She was previously on a nasal mask and is now using a small fullface mask.  She does prefer to sleep on her side and notices that this is usually when the events occur.  Breathing overall stable.  Rare use of albuterol.  Takes Singulair nightly.  04/22/2022-05/21/2022 CPAP auto 5-15 Airview download 30/30 days; 90% > 4 hours; average usage 6 hours 23 minutes Pressure median 10.1, 95th 13.9 Leaks median 4.2, 95th 23 AHI 6  Allergies  Allergen Reactions   Amoxicillin Shortness Of Breath  Esomeprazole Magnesium Diarrhea   Rabeprazole Diarrhea    Immunization History  Administered Date(s) Administered   PFIZER Comirnaty(Gray Top)Covid-19 Tri-Sucrose Vaccine 07/19/2020   PFIZER(Purple Top)SARS-COV-2 Vaccination 11/22/2019, 12/13/2019, 07/19/2020   Pneumococcal Conjugate-13 09/16/2020   Pneumococcal Polysaccharide-23 10/04/2021   Tdap 01/11/2012    Past Medical History:  Diagnosis Date   Allergic rhinitis    Allergy    Anemia    Anxiety    Arthritis    Cancer ([place])    Chronic idiopathic  constipation    GERD (gastroesophageal reflux disease)    Hyperlipidemia    Hypertension    Insomnia    Stage 3 chronic kidney disease (HCC)    Thyroid disease    Vertigo     Tobacco History: Social History   Tobacco Use  Smoking Status Never  Smokeless Tobacco Never   Counseling given: Not Answered   Outpatient Medications Prior to Visit  Medication Sig Dispense Refill   albuterol (VENTOLIN HFA) 108 (90 Base) MCG/ACT inhaler Inhale 1-2 puffs into the lungs every 4 (four) hours as needed for wheezing or shortness of breath. PRN 18 g 2   amLODipine (NORVASC) 10 MG tablet Take 1 tablet by mouth daily.     azelastine (ASTELIN) 0.1 % nasal spray Place 1 spray into both nostrils 2 (two) times daily. Use in each nostril as directed 30 mL 2   cyclobenzaprine (FLEXERIL) 10 MG tablet Take 1 tablet (10 mg total) by mouth 2 (two) times daily as needed for muscle spasms. 20 tablet 0   dicyclomine (BENTYL) 10 MG capsule 1-2 qid prn abdominal cramps 60 capsule 0   gabapentin (NEURONTIN) 100 MG capsule Take by mouth. PRN for back pain     hydrochlorothiazide (HYDRODIURIL) 25 MG tablet Take 25 mg by mouth daily.     hydrocortisone (ANUSOL-HC) 2.5 % rectal cream Bid prn hemorrhoids 30 g 1   metoprolol succinate (TOPROL-XL) 100 MG 24 hr tablet Take 1 tablet by mouth daily.     montelukast (SINGULAIR) 10 MG tablet Take 10 mg by mouth daily.     Multiple Vitamin (MULTI-VITAMIN) tablet Take 1 tablet by mouth daily.     pantoprazole (PROTONIX) 40 MG tablet Take 1 tablet by mouth daily before breakfast.     promethazine-codeine (PHENERGAN WITH CODEINE) 6.25-10 MG/5ML syrup Take 5 mLs by mouth every 6 (six) hours as needed for cough. 180 mL 0   pseudoephedrine-guaifenesin (MUCINEX D) 60-600 MG 12 hr tablet Take 1 tablet by mouth every 12 (twelve) hours.     doxycycline (VIBRA-TABS) 100 MG tablet Take 1 tablet (100 mg total) by mouth 2 (two) times daily. (Patient not taking: Reported on 05/23/2022) 14  tablet 0   Na Sulfate-K Sulfate-Mg Sulf 17.5-3.13-1.6 GM/177ML SOLN Take 1 kit by mouth as directed. 354 mL 0   predniSONE (DELTASONE) 10 MG tablet 4 tabs for 2 days, then 3 tabs for 2 days, 2 tabs for 2 days, then 1 tab for 2 days, then stop (Patient not taking: Reported on 05/23/2022) 20 tablet 0   No facility-administered medications prior to visit.     Review of Systems:   Constitutional: No weight loss or gain, night sweats, fevers, chills. +daytime fatigue (improving) HEENT: No difficulty swallowing, tooth/dental problems, or sore throat. No sneezing, itching, ear ache, nasal congestion CV:  No chest pain, orthopnea, PND, swelling in lower extremities, anasarca, dizziness, palpitations, syncope Resp: No shortness of breath with exertion or at rest. No excess mucus or change in color of  mucus. No hemoptysis. No wheezing.  No chest wall deformity Skin: No rash, lesions, ulcerations MSK:  No joint pain or swelling.  No decreased range of motion.  No back pain. Neuro: No dizziness or lightheadedness.  Psych: No depression or anxiety. Mood stable.     Physical Exam:  BP 124/78 (BP Location: Left Arm, Cuff Size: Large)   Pulse 70   Temp 98.6 F (37 C) (Temporal)   Ht _0  (1.753 m)   Wt 242 lb 6.4 oz (110 kg)   SpO2 99%   BMI 35.80 kg/m   GEN: Pleasant, interactive, well-appearing; obese; in no acute distress. HEENT:  Normocephalic and atraumatic. PERRLA. Sclera white. Nasal turbinates pink, moist and patent bilaterally. No rhinorrhea present. Oropharynx pink and moist, without exudate or edema. No lesions, ulcerations, or postnasal drip.  NECK:  Supple w/ fair ROM. No JVD present.  CV: RRR, no m/r/g, no peripheral edema. Pulses intact, +2 bilaterally. No cyanosis, pallor or clubbing. PULMONARY:  Unlabored, regular breathing. Clear bilaterally A&P w/o wheezes/rales/rhonchi. No accessory muscle use. No dullness to percussion. GI: BS present and normoactive. Soft, non-tender to  palpation. No organomegaly or masses detected.  MSK: No erythema, warmth or tenderness. No deformities or joint swelling noted.  Neuro: A/Ox3. No focal deficits noted.   Skin: Warm, no lesions or rashe Psych: Normal affect and behavior. Judgement and thought content appropriate.     Lab Results:  CBC    Component Value Date/Time   WBC 9.0 04/12/2021 1449   RBC 4.63 04/12/2021 1449   HGB 13.7 04/12/2021 1449   HCT 40.9 04/12/2021 1449   PLT 266.0 04/12/2021 1449   MCV 88.2 04/12/2021 1449   MCH 29.3 01/10/2021 1100   MCHC 33.5 04/12/2021 1449   RDW 13.6 04/12/2021 1449   LYMPHSABS 2.1 04/12/2021 1449   MONOABS 0.8 04/12/2021 1449   EOSABS 0.0 04/12/2021 1449   BASOSABS 0.1 04/12/2021 1449    BMET    Component Value Date/Time   NA 136 04/12/2021 1449   K 3.5 04/12/2021 1449   CL 99 04/12/2021 1449   CO2 26 04/12/2021 1449   GLUCOSE 97 04/12/2021 1449   BUN 11 04/12/2021 1449   CREATININE 1.02 04/12/2021 1449   CALCIUM 10.5 04/12/2021 1449   GFRNONAA 52 (L) 01/10/2021 1100   GFRAA (L) 07/25/2009 1243    56        The eGFR has been calculated using the MDRD equation. This calculation has not been validated in all clinical situations. eGFR's persistently <60 mL/min signify possible Chronic Kidney Disease.    BNP No results found for: "BNP"   Imaging:  No results found.       Latest Ref Rng & Units 12/20/2020   10:44 AM  PFT Results  FVC-Pre L 2.91   FVC-Predicted Pre % 94   FVC-Post L 3.12   FVC-Predicted Post % 101   Pre FEV1/FVC % % 81   Post FEV1/FCV % % 77   FEV1-Pre L 2.35   FEV1-Predicted Pre % 97   FEV1-Post L 2.39   DLCO uncorrected ml/min/mmHg 23.52   DLCO UNC% % 101   DLCO corrected ml/min/mmHg 23.52   DLCO COR %Predicted % 101   DLVA Predicted % 132   TLC L 6.05   TLC % Predicted % 104   RV % Predicted % 119     No results found for: "NITRICOXIDE"      Assessment & Plan:   Moderate obstructive sleep apnea  Moderate  obstructive sleep apnea with AHI 23.8.  She was started on CPAP therapy.  She has had excellent compliance with this.  She is having some breakthrough events with residual AHI 6/h.  Upon review of her download, does not appear that this always correlates with her leaks; although she is having moderate mask leaks.  We will adjust her settings to 8-16 cmH2O. she would also like to try different mask.  Suggested nasal pillow mask.  Order sent to DME.  We will reevaluate in 4 weeks.  Cautioned to be aware of and avoid drowsy driving.  Patient Instructions  -Continue albuterol inhaler 1-2 puffs every 6 hours as needed for shortness of breath or wheezing -Continue Singulair 10 mg At bedtime -Continue protonix 40 mg daily -Continue flonase nasal spray 1-2 sprays each nostril daily -Continue Saline rinses twice daily as needed for congestion  -Continue Astelin nasal spray 1 spray each nostril Twice daily as needed   Continue CPAP every night, minimum of 4-6 hours a night.  Change equipment every 30 days or as directed by DME. Wash your tubing with warm soap and water daily, hang to dry. Wash humidifier portion weekly.  Be aware of reduced alertness and do not drive or operate heavy machinery if experiencing this or drowsiness.   Adjust CPAP to 8-16 cmH2O; will send for a nasal pillow mask to try    Follow up in 4 weeks with Dr. Ander Slade or Alanson Aly. If symptoms do not improve or worsen, please contact office for sooner follow up or seek emergency care.    Allergic rhinitis Well-controlled on current regimen.  She will continue Singulair and Flonase.     Clayton Bibles, NP 05/23/2022  Pt aware and understands NP's role.

## 2022-05-23 NOTE — Assessment & Plan Note (Signed)
Moderate obstructive sleep apnea with AHI 23.8.  She was started on CPAP therapy.  She has had excellent compliance with this.  She is having some breakthrough events with residual AHI 6/h.  Upon review of her download, does not appear that this always correlates with her leaks; although she is having moderate mask leaks.  We will adjust her settings to 8-16 cmH2O. she would also like to try different mask.  Suggested nasal pillow mask.  Order sent to DME.  We will reevaluate in 4 weeks.  Cautioned to be aware of and avoid drowsy driving.  Patient Instructions  -Continue albuterol inhaler 1-2 puffs every 6 hours as needed for shortness of breath or wheezing -Continue Singulair 10 mg At bedtime -Continue protonix 40 mg daily -Continue flonase nasal spray 1-2 sprays each nostril daily -Continue Saline rinses twice daily as needed for congestion  -Continue Astelin nasal spray 1 spray each nostril Twice daily as needed   Continue CPAP every night, minimum of 4-6 hours a night.  Change equipment every 30 days or as directed by DME. Wash your tubing with warm soap and water daily, hang to dry. Wash humidifier portion weekly.  Be aware of reduced alertness and do not drive or operate heavy machinery if experiencing this or drowsiness.   Adjust CPAP to 8-16 cmH2O; will send for a nasal pillow mask to try    Follow up in 4 weeks with Dr. Ander Slade or Alanson Aly. If symptoms do not improve or worsen, please contact office for sooner follow up or seek emergency care.

## 2022-06-05 ENCOUNTER — Ambulatory Visit
Admission: RE | Admit: 2022-06-05 | Discharge: 2022-06-05 | Disposition: A | Discharge status: Home / Self Care | Payer: Medicare Other | Source: Ambulatory Visit | Attending: Internal Medicine | Admitting: Internal Medicine

## 2022-06-05 DIAGNOSIS — R928 Other abnormal and inconclusive findings on diagnostic imaging of breast: Secondary | ICD-10-CM

## 2022-06-05 DIAGNOSIS — C50919 Malignant neoplasm of unspecified site of unspecified female breast: Secondary | ICD-10-CM

## 2022-06-05 HISTORY — DX: Malignant neoplasm of unspecified site of unspecified female breast: C50.919

## 2022-06-05 MED ORDER — GADOBUTROL 1 MMOL/ML IV SOLN
10.0000 mL | Freq: Once | INTRAVENOUS | Status: AC | PRN
  Administered 2022-06-05: 10 mL via INTRAVENOUS

## 2022-06-14 ENCOUNTER — Telehealth: Payer: Self-pay | Admitting: Hematology and Oncology

## 2022-06-14 NOTE — Telephone Encounter (Signed)
Scheduled appts per 10/18 staff msg. Called pt, no answer. Left msg with appts date/times. Requested for pt to call back to confirm appts.

## 2022-06-15 ENCOUNTER — Other Ambulatory Visit: Payer: Self-pay | Admitting: *Deleted

## 2022-06-15 ENCOUNTER — Inpatient Hospital Stay (HOSPITAL_BASED_OUTPATIENT_CLINIC_OR_DEPARTMENT_OTHER): Payer: Medicare Other | Admitting: Genetic Counselor

## 2022-06-15 ENCOUNTER — Inpatient Hospital Stay: Payer: Medicare Other | Attending: Genetic Counselor

## 2022-06-15 ENCOUNTER — Encounter: Payer: Self-pay | Admitting: *Deleted

## 2022-06-15 ENCOUNTER — Other Ambulatory Visit: Payer: Self-pay

## 2022-06-15 ENCOUNTER — Inpatient Hospital Stay (HOSPITAL_BASED_OUTPATIENT_CLINIC_OR_DEPARTMENT_OTHER): Payer: Medicare Other | Admitting: Hematology and Oncology

## 2022-06-15 ENCOUNTER — Encounter: Payer: Self-pay | Admitting: Genetic Counselor

## 2022-06-15 ENCOUNTER — Other Ambulatory Visit: Payer: Self-pay | Admitting: General Surgery

## 2022-06-15 ENCOUNTER — Other Ambulatory Visit: Payer: Self-pay | Admitting: Genetic Counselor

## 2022-06-15 VITALS — BP 156/74 | HR 92 | Temp 97.2°F | Resp 18 | Ht 69.0 in | Wt 235.5 lb

## 2022-06-15 DIAGNOSIS — Z171 Estrogen receptor negative status [ER-]: Secondary | ICD-10-CM

## 2022-06-15 DIAGNOSIS — C50411 Malignant neoplasm of upper-outer quadrant of right female breast: Secondary | ICD-10-CM | POA: Diagnosis not present

## 2022-06-15 DIAGNOSIS — Z1379 Encounter for other screening for genetic and chromosomal anomalies: Secondary | ICD-10-CM

## 2022-06-15 LAB — GENETIC SCREENING ORDER

## 2022-06-15 MED ORDER — PROCHLORPERAZINE MALEATE 10 MG PO TABS: 10.0000 mg | ORAL_TABLET | Freq: Four times a day (QID) | ORAL | 1 refills | Status: DC | PRN

## 2022-06-15 MED ORDER — LIDOCAINE-PRILOCAINE 2.5-2.5 % EX CREA: TOPICAL_CREAM | CUTANEOUS | 3 refills | Status: DC

## 2022-06-15 MED ORDER — DEXAMETHASONE 4 MG PO TABS: ORAL_TABLET | ORAL | 0 refills | Status: DC

## 2022-06-15 MED ORDER — ONDANSETRON HCL 8 MG PO TABS: ORAL_TABLET | ORAL | 1 refills | Status: DC

## 2022-06-15 NOTE — Research (Signed)
Trial:  [employer] 2021-05 - Specimen Collection Study to Evaluate Biomarkers in Subjects with Cancer   Patient Karina Ferguson was identified by Dr. Lindi Adie as a potential candidate for the above listed study.  This Clinical Research Nurse met with Corey Caulfield, [id number], on 06/15/22 in a manner and location that ensures patient privacy to discuss participation in the above listed research study.  Patient is Accompanied by her sister .  A copy of the informed consent document with embedded HIPAA language was provided to the patient.  Patient reads, speaks, and understands [place].   Patient was provided with the business card of this Nurse and encouraged to contact the research team with any questions.  Approximately 10 minutes were spent with the patient reviewing the informed consent documents.  Patient was provided the option of taking informed consent documents home to review and was encouraged to review at their convenience with their support network, including other care providers. Patient took the consent documents home to review.  Patient agreed to follow up phone call from research in a few days to see if she is interested and/or has any questions.  Foye Spurling, BSN, RN, [place] Nurse II [phone] 06/15/2022

## 2022-06-15 NOTE — Research (Signed)
Trial:  [id number] - TREATMENT OF REFRACTORY NAUSEA  Patient Karina Ferguson was identified by Dr. Lindi Adie as a potential candidate for the above listed study.  This Clinical Research Nurse met with Karina Ferguson, [id number], on 06/15/22 in a manner and location that ensures patient privacy to discuss participation in the above listed research study.  Patient is Accompanied by her sister .  A copy of the informed consent document and separate HIPAA Authorization was provided to the patient.  Patient reads, speaks, and understands [place].   Patient was provided with the business card of this Nurse and encouraged to contact the research team with any questions.  Approximately 5 minutes were spent with the patient reviewing the informed consent documents.  Patient was provided the option of taking informed consent documents home to review and was encouraged to review at their convenience with their support network, including other care providers. Patient took the consent documents home to review. Patient agreed to follow up call by research in a few days to see if she has any questions and if she is interested in participating.  Foye Spurling, BSN, RN, [facility] Nurse II [phone] 06/15/2022

## 2022-06-15 NOTE — Progress Notes (Signed)
[facility] CONSULT NOTE  Patient Care Team: Nicola Girt, DO as PCP - General (Internal Medicine)  CHIEF COMPLAINTS/PURPOSE OF CONSULTATION:  Newly diagnosed breast cancer  HISTORY OF PRESENTING ILLNESS:  Karina Ferguson 66 y.o. female is here because of recent diagnosis of right breast cancer.  Patient had a routine mammogram which did not show any abnormality but because it was density C she requested a breast MRI.  The breast MRI identified 2 areas of abnormality 1 was 1.9 cm and biopsy was triple negative breast cancer.  The other biopsy was benign.  She was seen by Dr. Donne Hazel who recommended neoadjuvant chemotherapy and she was sent to [place] for discussion regarding treatment options.  I reviewed her records extensively and collaborated the history with the patient.  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% Ki-67 60%, HER2 negative, 8 mm periareolar mass: Biopsy fibrocystic change (Prior history 2004 left breast DCIS ER/PR negative)   06/15/2022 Cancer Staging   Staging form: Breast, AJCC 8th Edition - Clinical: Stage IB (cT1c, cN0, cM0, G3, ER-, PR-, HER2-) - Signed by Nicholas Lose, MD on 06/15/2022 Stage prefix: Initial diagnosis Histologic grading system: 3 grade system      MEDICAL HISTORY:  Past Medical History:  Diagnosis Date   Allergic rhinitis    Allergy    Anemia    Anxiety    Arthritis    Cancer (HCC)    Chronic idiopathic constipation    GERD (gastroesophageal reflux disease)    Hyperlipidemia    Hypertension    Insomnia    Stage 3 chronic kidney disease (HCC)    Thyroid disease    Vertigo     SURGICAL HISTORY: Past Surgical History:  Procedure Laterality Date    ABDOMINAL HYSTERECTOMY     Left breast cancer     THYROIDECTOMY, PARTIAL      SOCIAL HISTORY: Social History   Socioeconomic History   Marital status: Married    Spouse name: Not on file   Number of children: Not on file   Years of education: Not on file   Highest education level: Not on file  Occupational History   Not on file  Tobacco Use   Smoking status: Never   Smokeless tobacco: Never  Vaping Use   Vaping Use: Never used  Substance and Sexual Activity   Alcohol use: Yes    Comment: occasional wine   Drug use: Never   Sexual activity: Not on file  Other Topics Concern   Not on file  Social History Narrative   She is married, she has 1 son and 1 daughter they are twins and 4 grandchildren.      She is retired from [employer] having done many jobs there.      No alcohol 1 caffeinated beverage a day never smoker and no tobacco.   Social Determinants of Health   Financial Resource Strain: Not on file  Food Insecurity: Not on file  Transportation Needs: Not on file  Physical Activity: Not on file  Stress: Not on file  Social Connections: Not on file  Intimate Partner Violence: Not on file    FAMILY HISTORY: Family History  Problem Relation Age of Onset   Colon  cancer Neg Hx    Pancreatic cancer Neg Hx    Esophageal cancer Neg Hx    Stomach cancer Neg Hx    Rectal cancer Neg Hx     ALLERGIES:  is allergic to amoxicillin, esomeprazole magnesium, and rabeprazole.  MEDICATIONS:  Current Outpatient Medications  Medication Sig Dispense Refill   albuterol (VENTOLIN HFA) 108 (90 Base) MCG/ACT inhaler Inhale 1-2 puffs into the lungs every 4 (four) hours as needed for wheezing or shortness of breath. PRN 18 g 2   amLODipine (NORVASC) 10 MG tablet Take 1 tablet by mouth daily.     azelastine (ASTELIN) 0.1 % nasal spray Place 1 spray into both nostrils 2 (two) times daily. Use in each nostril as directed 30 mL 2   cyclobenzaprine (FLEXERIL) 10 MG  tablet Take 1 tablet (10 mg total) by mouth 2 (two) times daily as needed for muscle spasms. 20 tablet 0   dicyclomine (BENTYL) 10 MG capsule 1-2 qid prn abdominal cramps 60 capsule 0   doxycycline (VIBRA-TABS) 100 MG tablet Take 1 tablet (100 mg total) by mouth 2 (two) times daily. (Patient not taking: Reported on 05/23/2022) 14 tablet 0   gabapentin (NEURONTIN) 100 MG capsule Take by mouth. PRN for back pain     hydrochlorothiazide (HYDRODIURIL) 25 MG tablet Take 25 mg by mouth daily.     hydrocortisone (ANUSOL-HC) 2.5 % rectal cream Bid prn hemorrhoids 30 g 1   metoprolol succinate (TOPROL-XL) 100 MG 24 hr tablet Take 1 tablet by mouth daily.     montelukast (SINGULAIR) 10 MG tablet Take 10 mg by mouth daily.     Multiple Vitamin (MULTI-VITAMIN) tablet Take 1 tablet by mouth daily.     Na Sulfate-K Sulfate-Mg Sulf 17.5-3.13-1.6 GM/177ML SOLN Take 1 kit by mouth as directed. 354 mL 0   pantoprazole (PROTONIX) 40 MG tablet Take 1 tablet by mouth daily before breakfast.     predniSONE (DELTASONE) 10 MG tablet 4 tabs for 2 days, then 3 tabs for 2 days, 2 tabs for 2 days, then 1 tab for 2 days, then stop (Patient not taking: Reported on 05/23/2022) 20 tablet 0   promethazine-codeine (PHENERGAN WITH CODEINE) 6.25-10 MG/5ML syrup Take 5 mLs by mouth every 6 (six) hours as needed for cough. 180 mL 0   pseudoephedrine-guaifenesin (MUCINEX D) 60-600 MG 12 hr tablet Take 1 tablet by mouth every 12 (twelve) hours.     No current facility-administered medications for this visit.    REVIEW OF SYSTEMS:   Constitutional: Denies fevers, chills or abnormal night sweats   All other systems were reviewed with the patient and are negative.  PHYSICAL EXAMINATION: ECOG PERFORMANCE STATUS: 1 - Symptomatic but completely ambulatory  Vitals:   06/15/22 1202  BP: (!) 156/74  Pulse: 92  Resp: 18  Temp: (!) 97.2 F (36.2 C)  SpO2: 99%   Filed Weights   06/15/22 1202  Weight: 235 lb 8 oz (106.8 kg)     GENERAL:alert, no distress and comfortable    LABORATORY DATA:  I have reviewed the data as listed Lab Results  Component Value Date   WBC 9.0 04/12/2021   HGB 13.7 04/12/2021   HCT 40.9 04/12/2021   MCV 88.2 04/12/2021   PLT 266.0 04/12/2021   Lab Results  Component Value Date   NA 136 04/12/2021   K 3.5 04/12/2021   CL 99 04/12/2021   CO2 26 04/12/2021    RADIOGRAPHIC STUDIES: I have personally reviewed the radiological reports  and agreed with the findings in the report.  ASSESSMENT AND PLAN:  Malignant neoplasm of upper-outer quadrant of right breast in female, estrogen receptor negative ([place]) Breast MRI performed because of high breast density 05/10/2022: Right breast linear enhancement 1.9 cm at 12 o'clock position, indeterminate mass 8 mm in the lateral periareolar region right breast. 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  Pathology and radiology counseling: Discussed with the patient, the details of pathology including the type of breast cancer,the clinical staging, the significance of ER, PR and HER-2/neu receptors and the implications for treatment. After reviewing the pathology in detail, we proceeded to discuss the different treatment options between surgery, radiation, chemotherapy, antiestrogen therapies.  Treatment plan: 1.  Neoadjuvant with dose dense Adriamycin and Cytoxan x4 followed by Taxol and carbo x12 2. breast conserving surgery 3.  Adjuvant radiation  Chemotherapy Counseling: I discussed the risks and benefits of chemotherapy including the risks of nausea/ vomiting, risk of infection from low WBC count, fatigue due to chemo or anemia, bruising or bleeding due to low platelets, mouth sores, loss/ change in taste and decreased appetite. Liver and kidney function will be monitored  through out chemotherapy as abnormalities in liver and kidney function may be a side effect of treatment. Cardiac dysfunction due to Adriamycin and neuropathy risk from Neulasta were discussed in detail. Risk of permanent bone marrow dysfunction and leukemia due to chemo were also discussed.  We also recommended participation in the nausea study and the blood draw studies. Return to clinic in 2 weeks to start chemotherapy  All questions were answered. The patient knows to call the clinic with any problems, questions or concerns.    Harriette Ohara, MD 06/15/22

## 2022-06-15 NOTE — Assessment & Plan Note (Signed)
Breast MRI performed because of high breast density 05/10/2022: Right breast linear enhancement 1.9 cm at 12 o'clock position, indeterminate mass 8 mm in the lateral periareolar region right breast. 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  Pathology and radiology counseling: Discussed with the patient, the details of pathology including the type of breast cancer,the clinical staging, the significance of ER, PR and HER-2/neu receptors and the implications for treatment. After reviewing the pathology in detail, we proceeded to discuss the different treatment options between surgery, radiation, chemotherapy, antiestrogen therapies.  Treatment plan: 1.  Neoadjuvant versus adjuvant chemotherapy 2. breast conserving surgery 3.  Adjuvant radiation

## 2022-06-15 NOTE — Progress Notes (Signed)
REFERRING PROVIDER: Nicholas Lose, MD  PRIMARY PROVIDER:  Nicola Girt, DO  PRIMARY REASON FOR VISIT:  1. Malignant neoplasm of upper-outer quadrant of right breast in female, estrogen receptor negative ([place])     HISTORY OF PRESENT ILLNESS:   Karina Ferguson, a 66 y.o. female, was seen for a [place] cancer genetics consultation at the request of Dr. Lindi Adie due to a personal history of cancer.  Karina Ferguson presents to clinic today to discuss the possibility of a hereditary predisposition to cancer, to discuss genetic testing, and to further clarify her future cancer risks, as well as potential cancer risks for family members.   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 She also has a history of left breast ductal carcinoma in situ diagnosed in [birth year]64 (~66 years old).    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  Uterus intact: no.  Menopausal status: postmenopausal Colonoscopy: yes;  2022- reportedly normal . Mammogram within the last year: yes.  Past Medical History:  Diagnosis Date   Allergic rhinitis    Allergy    Anemia    Anxiety    Arthritis    Cancer (HCC)    Chronic idiopathic constipation    GERD  (gastroesophageal reflux disease)    Hyperlipidemia    Hypertension    Insomnia    Stage 3 chronic kidney disease (HCC)    Thyroid disease    Vertigo     Past Surgical History:  Procedure Laterality Date   ABDOMINAL HYSTERECTOMY     Left breast cancer     THYROIDECTOMY, PARTIAL      Social History   Socioeconomic History   Marital status: Married    Spouse name: Not on file   Number of children: Not on file   Years of education: Not on file   Highest education level: Not on file  Occupational History   Not on file  Tobacco Use   Smoking status: Never   Smokeless tobacco: Never  Vaping Use   Vaping Use: Never used  Substance and Sexual Activity   Alcohol use: Yes    Comment: occasional wine   Drug use: Never   Sexual activity: Not on file  Other Topics Concern   Not on file  Social History Narrative   She is married, she has 1 son and 1 daughter they are twins and 4 grandchildren.      She is retired from [employer] having done many jobs there.      No alcohol 1 caffeinated beverage a day never smoker and no tobacco.   Social Determinants of Health   Financial Resource Strain: Not on file  Food Insecurity: Not on file  Transportation Needs: Not on file  Physical Activity: Not on file  Stress: Not on file  Social Connections: Not on file     FAMILY HISTORY:  We obtained a detailed, 4-generation family history.  Significant diagnoses are listed below: Family History  Problem Relation Age of Onset   Breast cancer Paternal Aunt    Lung cancer Paternal Uncle        he smoked   Lung cancer Paternal Uncle        he smoked   Colon cancer Neg Hx    Pancreatic cancer Neg Hx    Esophageal cancer Neg Hx    Stomach cancer Neg Hx    Rectal cancer Neg Hx        Karina Ferguson has 4 paternal aunts and one was diagnosed with breast cancer at an unknown age, she is deceased. Karina Ferguson has 5 paternal uncles and 2 died due to lung cancer, both smoked.  Karina Ferguson is unaware of previous family history of genetic testing for hereditary cancer risks. There is no reported Ashkenazi Jewish ancestry.   GENETIC COUNSELING ASSESSMENT: Karina Ferguson is a 66 y.o. female with a personal and family history of cancer which is somewhat suggestive of a hereditary predisposition to cancer given her personal history of contralateral breast cancer as well as triple negative breast cancer. We, therefore, discussed and recommended the following at today's visit.   DISCUSSION: We discussed that 5 - 10% of cancer is hereditary, with most cases of breast cancer associated with BRCA1/2.  There are other genes that can be associated with hereditary breast cancer syndromes.  We discussed that testing is beneficial for several reasons including knowing how to follow individuals after completing their treatment, identifying whether potential treatment options would be beneficial, and understanding if other family members could be at risk for cancer and allowing them to undergo genetic testing.   We reviewed the characteristics, features and inheritance patterns of hereditary cancer syndromes. We also discussed genetic testing, including the appropriate family members to test, the process of testing, insurance coverage and turn-around-time for results. We discussed the implications of a negative, positive, carrier and/or variant of uncertain significant result. We recommended Karina Ferguson pursue genetic testing for a panel that includes genes associated with breast cancer.   Karina Ferguson elected to have [facility] Common Hereditary Cancer Panel.  The Common Hereditary Cancers + RNA Panel offered by [facility] includes sequencing, deletion/duplication, and RNA testing of the following 47 genes: APC, ATM, AXIN2, BARD1, BMPR1A, BRCA1, BRCA2, BRIP1, CDH1, CDK4*, CDKN2A (p14ARF)*, CDKN2A (p16INK4a)*, CHEK2, CTNNA1, DICER1, EPCAM (Deletion/duplication testing only), GREM1 (promoter region  deletion/duplication testing only), KIT, MEN1, MLH1, MSH2, MSH3, MSH6, MUTYH, NBN, NF1, NHTL1, PALB2, PDGFRA*, PMS2, POLD1, POLE, PTEN, RAD50, RAD51C, RAD51D, SDHB, SDHC, SDHD, SMAD4, SMARCA4. STK11, TP53, TSC1, TSC2, and VHL.  The following genes were evaluated for sequence changes only: SDHA and HOXB13 c.251G>A variant only.  RNA analysis is not performed for the * genes.    Based on Karina Ferguson's personal and family history of cancer, she meets medical criteria for genetic testing. Despite that she meets criteria, she may still have an out of pocket cost. We discussed that if her out of pocket cost for testing is over $100, the laboratory will call and confirm whether she wants to proceed with testing.  If the out of pocket cost of testing is less than $100 she will be billed by the genetic testing laboratory.   PLAN: After considering the risks, benefits, and limitations, Karina Ferguson provided  informed consent to pursue genetic testing and the blood sample was sent to [facility] for analysis of the Common Cancer Panel. Results should be available within approximately 2-3 weeks' time, at which point they will be disclosed by telephone to Karina Ferguson, as will any additional recommendations warranted by these results. Karina Ferguson will receive a summary of her genetic counseling visit and a copy of her results once available. This information will also be available in Epic.   Karina Ferguson questions were answered to her satisfaction today. Our contact information was provided should additional questions or concerns arise. Thank you for the referral and allowing [place] to share in the care of your patient.   Karina Passy, MS, [facility] Genetic Counselor [email or web address] (P) [phone]  The patient was seen for a total of 20 minutes in face-to-face genetic counseling. The patient brought her sister. Drs. Lindi Adie and/or Burr Medico were available to discuss this case as needed.    _______________________________________________________________________ For Office Staff:  Number of people involved in session: 2 Was an Intern/ student involved with case: no

## 2022-06-15 NOTE — Progress Notes (Signed)
START ON PATHWAY REGIMEN - Breast     A cycle is every 14 days (cycles 1-4):     Doxorubicin      Cyclophosphamide      Pegfilgrastim-[id number]    A cycle is every 21 days (cycles 5-8):     Paclitaxel      Carboplatin   **Always confirm dose/schedule in your pharmacy ordering system**  Patient Characteristics: Preoperative or Nonsurgical Candidate (Clinical Staging), Neoadjuvant Therapy followed by Surgery, Invasive Disease, Chemotherapy, HER2 Negative, ER Negative, Platinum Therapy Indicated and Not a Candidate for Checkpoint Inhibitor Therapeutic Status: Preoperative or Nonsurgical Candidate (Clinical Staging) AJCC M Category: cM0 AJCC Grade: G3 Breast Surgical Plan: Neoadjuvant Therapy followed by Surgery ER Status: Negative (-) AJCC 8 Stage Grouping: IB HER2 Status: Negative (-) AJCC T Category: cT1c AJCC N Category: cN0 PR Status: Negative (-) Intent of Therapy: Curative Intent, Discussed with Patient

## 2022-06-16 ENCOUNTER — Other Ambulatory Visit: Payer: Self-pay | Admitting: *Deleted

## 2022-06-16 ENCOUNTER — Other Ambulatory Visit: Payer: Self-pay

## 2022-06-16 ENCOUNTER — Encounter (HOSPITAL_BASED_OUTPATIENT_CLINIC_OR_DEPARTMENT_OTHER): Payer: Self-pay | Admitting: General Surgery

## 2022-06-16 ENCOUNTER — Telehealth: Payer: Self-pay | Admitting: *Deleted

## 2022-06-16 ENCOUNTER — Telehealth: Payer: Self-pay | Admitting: Hematology and Oncology

## 2022-06-16 ENCOUNTER — Encounter: Payer: Self-pay | Admitting: *Deleted

## 2022-06-16 ENCOUNTER — Other Ambulatory Visit: Payer: Self-pay | Admitting: Hematology and Oncology

## 2022-06-16 DIAGNOSIS — Z171 Estrogen receptor negative status [ER-]: Secondary | ICD-10-CM

## 2022-06-16 MED ORDER — LORAZEPAM 0.5 MG PO TABS: 0.5000 mg | ORAL_TABLET | Freq: Three times a day (TID) | ORAL | 0 refills | Status: DC | PRN

## 2022-06-16 NOTE — Telephone Encounter (Signed)
Left vm with navigation resources and contact information. Request return call with questions or needs. 

## 2022-06-16 NOTE — Telephone Encounter (Signed)
Scheduled appointment per WQ. Patient is aware. 

## 2022-06-16 NOTE — Telephone Encounter (Signed)
Spoke to pt, provided navigation resources. Discussed anti-nausea medications. Confirmed future appts and port placement.

## 2022-06-17 ENCOUNTER — Other Ambulatory Visit: Payer: Self-pay

## 2022-06-19 ENCOUNTER — Other Ambulatory Visit: Payer: Self-pay | Admitting: Hematology and Oncology

## 2022-06-19 ENCOUNTER — Ambulatory Visit: Payer: Medicare Other | Admitting: Rehabilitation

## 2022-06-19 ENCOUNTER — Encounter (HOSPITAL_BASED_OUTPATIENT_CLINIC_OR_DEPARTMENT_OTHER)
Admission: RE | Disposition: A | Discharge status: Home / Self Care | Payer: Self-pay | Source: Home / Self Care | Attending: General Surgery

## 2022-06-19 ENCOUNTER — Other Ambulatory Visit: Payer: Medicare Other

## 2022-06-19 ENCOUNTER — Other Ambulatory Visit: Payer: Self-pay

## 2022-06-19 ENCOUNTER — Ambulatory Visit (HOSPITAL_BASED_OUTPATIENT_CLINIC_OR_DEPARTMENT_OTHER)
Admission: RE | Admit: 2022-06-19 | Discharge: 2022-06-19 | Disposition: A | Discharge status: Home / Self Care | Payer: Medicare Other | Attending: General Surgery | Admitting: General Surgery

## 2022-06-19 ENCOUNTER — Ambulatory Visit (HOSPITAL_BASED_OUTPATIENT_CLINIC_OR_DEPARTMENT_OTHER): Payer: Medicare Other | Admitting: Anesthesiology

## 2022-06-19 ENCOUNTER — Encounter (HOSPITAL_BASED_OUTPATIENT_CLINIC_OR_DEPARTMENT_OTHER): Payer: Self-pay | Admitting: General Surgery

## 2022-06-19 ENCOUNTER — Ambulatory Visit (HOSPITAL_COMMUNITY): Payer: Medicare Other

## 2022-06-19 DIAGNOSIS — I129 Hypertensive chronic kidney disease with stage 1 through stage 4 chronic kidney disease, or unspecified chronic kidney disease: Secondary | ICD-10-CM | POA: Insufficient documentation

## 2022-06-19 DIAGNOSIS — G4730 Sleep apnea, unspecified: Secondary | ICD-10-CM | POA: Insufficient documentation

## 2022-06-19 DIAGNOSIS — G709 Myoneural disorder, unspecified: Secondary | ICD-10-CM | POA: Diagnosis not present

## 2022-06-19 DIAGNOSIS — C50411 Malignant neoplasm of upper-outer quadrant of right female breast: Secondary | ICD-10-CM | POA: Insufficient documentation

## 2022-06-19 DIAGNOSIS — I10 Essential (primary) hypertension: Secondary | ICD-10-CM

## 2022-06-19 DIAGNOSIS — N189 Chronic kidney disease, unspecified: Secondary | ICD-10-CM | POA: Diagnosis not present

## 2022-06-19 DIAGNOSIS — Z171 Estrogen receptor negative status [ER-]: Secondary | ICD-10-CM | POA: Insufficient documentation

## 2022-06-19 DIAGNOSIS — M1990 Unspecified osteoarthritis, unspecified site: Secondary | ICD-10-CM | POA: Insufficient documentation

## 2022-06-19 DIAGNOSIS — G4733 Obstructive sleep apnea (adult) (pediatric): Secondary | ICD-10-CM

## 2022-06-19 DIAGNOSIS — Z8679 Personal history of other diseases of the circulatory system: Secondary | ICD-10-CM

## 2022-06-19 DIAGNOSIS — Z79899 Other long term (current) drug therapy: Secondary | ICD-10-CM | POA: Insufficient documentation

## 2022-06-19 DIAGNOSIS — C50911 Malignant neoplasm of unspecified site of right female breast: Secondary | ICD-10-CM | POA: Diagnosis not present

## 2022-06-19 DIAGNOSIS — Z9989 Dependence on other enabling machines and devices: Secondary | ICD-10-CM

## 2022-06-19 HISTORY — DX: Sleep apnea, unspecified: G47.30

## 2022-06-19 HISTORY — PX: PORTACATH PLACEMENT: SHX2246

## 2022-06-19 LAB — BASIC METABOLIC PANEL
Anion gap: 15 (ref 5–15)
BUN: 10 mg/dL (ref 8–23)
CO2: 21 mmol/L — ABNORMAL LOW (ref 22–32)
Calcium: 10.4 mg/dL — ABNORMAL HIGH (ref 8.9–10.3)
Chloride: 105 mmol/L (ref 98–111)
Creatinine, Ser: 1.18 mg/dL — ABNORMAL HIGH (ref 0.44–1.00)
GFR, Estimated: 50 mL/min — ABNORMAL LOW (ref >60)
Glucose, Bld: 107 mg/dL — ABNORMAL HIGH (ref 70–99)
Potassium: 3.6 mmol/L (ref 3.5–5.1)
Sodium: 141 mmol/L (ref 135–145)

## 2022-06-19 SURGERY — INSERTION, TUNNELED CENTRAL VENOUS DEVICE, WITH PORT
Anesthesia: General | Site: Chest | Laterality: Right

## 2022-06-19 MED ORDER — BUPIVACAINE HCL (PF) 0.25 % IJ SOLN
INTRAMUSCULAR | Status: AC
  Filled 2022-06-19: qty 30

## 2022-06-19 MED ORDER — FENTANYL CITRATE (PF) 100 MCG/2ML IJ SOLN
INTRAMUSCULAR | Status: AC
  Filled 2022-06-19: qty 2

## 2022-06-19 MED ORDER — KETOROLAC TROMETHAMINE 15 MG/ML IJ SOLN
15.0000 mg | Freq: Once | INTRAMUSCULAR | Status: AC | PRN
  Administered 2022-06-19: 15 mg via INTRAVENOUS

## 2022-06-19 MED ORDER — EPHEDRINE 5 MG/ML INJ
INTRAVENOUS | Status: AC
  Filled 2022-06-19: qty 5

## 2022-06-19 MED ORDER — PROPOFOL 10 MG/ML IV BOLUS
INTRAVENOUS | Status: DC | PRN
  Administered 2022-06-19: 150 mg via INTRAVENOUS

## 2022-06-19 MED ORDER — EPHEDRINE SULFATE (PRESSORS) 50 MG/ML IJ SOLN
INTRAMUSCULAR | Status: DC | PRN
  Administered 2022-06-19: 10 mg via INTRAVENOUS
  Administered 2022-06-19: 15 mg via INTRAVENOUS
  Administered 2022-06-19: 10 mg via INTRAVENOUS

## 2022-06-19 MED ORDER — CIPROFLOXACIN IN D5W 400 MG/200ML IV SOLN
400.0000 mg | INTRAVENOUS | Status: AC
  Administered 2022-06-19: 400 mg via INTRAVENOUS

## 2022-06-19 MED ORDER — LIDOCAINE HCL (CARDIAC) PF 100 MG/5ML IV SOSY
PREFILLED_SYRINGE | INTRAVENOUS | Status: DC | PRN
  Administered 2022-06-19: 60 mg via INTRAVENOUS

## 2022-06-19 MED ORDER — ACETAMINOPHEN 500 MG PO TABS
1000.0000 mg | ORAL_TABLET | ORAL | Status: AC
  Administered 2022-06-19: 1000 mg via ORAL

## 2022-06-19 MED ORDER — AMISULPRIDE (ANTIEMETIC) 5 MG/2ML IV SOLN: 10.0000 mg | Freq: Once | INTRAVENOUS | Status: DC | PRN

## 2022-06-19 MED ORDER — FENTANYL CITRATE (PF) 100 MCG/2ML IJ SOLN: 25.0000 ug | INTRAMUSCULAR | Status: DC | PRN

## 2022-06-19 MED ORDER — HEPARIN SOD (PORK) LOCK FLUSH 100 UNIT/ML IV SOLN
INTRAVENOUS | Status: DC | PRN
  Administered 2022-06-19: 450 [IU] via INTRAVENOUS

## 2022-06-19 MED ORDER — ONDANSETRON HCL 4 MG/2ML IJ SOLN: 4.0000 mg | Freq: Once | INTRAMUSCULAR | Status: DC | PRN

## 2022-06-19 MED ORDER — CHLORHEXIDINE GLUCONATE CLOTH 2 % EX PADS: 6.0000 | MEDICATED_PAD | Freq: Once | CUTANEOUS | Status: DC

## 2022-06-19 MED ORDER — KETOROLAC TROMETHAMINE 30 MG/ML IJ SOLN
INTRAMUSCULAR | Status: AC
  Filled 2022-06-19: qty 1

## 2022-06-19 MED ORDER — LACTATED RINGERS IV SOLN: INTRAVENOUS | Status: DC

## 2022-06-19 MED ORDER — FENTANYL CITRATE (PF) 100 MCG/2ML IJ SOLN
INTRAMUSCULAR | Status: DC | PRN
  Administered 2022-06-19: 25 ug via INTRAVENOUS
  Administered 2022-06-19: 50 ug via INTRAVENOUS
  Administered 2022-06-19: 25 ug via INTRAVENOUS

## 2022-06-19 MED ORDER — HEPARIN (PORCINE) IN NACL 2-0.9 UNITS/ML
INTRAMUSCULAR | Status: AC | PRN
  Administered 2022-06-19: 1 via INTRAVENOUS

## 2022-06-19 MED ORDER — ACETAMINOPHEN 500 MG PO TABS
ORAL_TABLET | ORAL | Status: AC
  Filled 2022-06-19: qty 2

## 2022-06-19 MED ORDER — CIPROFLOXACIN IN D5W 400 MG/200ML IV SOLN
INTRAVENOUS | Status: AC
  Filled 2022-06-19: qty 200

## 2022-06-19 MED ORDER — TRAMADOL HCL 50 MG PO TABS: 50.0000 mg | ORAL_TABLET | Freq: Four times a day (QID) | ORAL | 0 refills | Status: DC | PRN

## 2022-06-19 MED ORDER — ONDANSETRON HCL 4 MG/2ML IJ SOLN
INTRAMUSCULAR | Status: DC | PRN
  Administered 2022-06-19: 4 mg via INTRAVENOUS

## 2022-06-19 MED ORDER — DEXAMETHASONE SODIUM PHOSPHATE 4 MG/ML IJ SOLN
INTRAMUSCULAR | Status: DC | PRN
  Administered 2022-06-19: 5 mg via INTRAVENOUS

## 2022-06-19 MED ORDER — MIDAZOLAM HCL 2 MG/2ML IJ SOLN
INTRAMUSCULAR | Status: AC
  Filled 2022-06-19: qty 2

## 2022-06-19 MED ORDER — BUPIVACAINE HCL (PF) 0.25 % IJ SOLN
INTRAMUSCULAR | Status: DC | PRN
  Administered 2022-06-19: 4 mL

## 2022-06-19 MED ORDER — ENSURE PRE-SURGERY PO LIQD: 296.0000 mL | Freq: Once | ORAL | Status: DC

## 2022-06-19 MED ORDER — MIDAZOLAM HCL 5 MG/5ML IJ SOLN
INTRAMUSCULAR | Status: DC | PRN
  Administered 2022-06-19: 2 mg via INTRAVENOUS

## 2022-06-19 SURGICAL SUPPLY — 49 items
ADH SKN CLS APL DERMABOND .7 (GAUZE/BANDAGES/DRESSINGS) ×1
APL PRP STRL LF DISP 70% ISPRP (MISCELLANEOUS) ×1
APL SKNCLS STERI-STRIP NONHPOA (GAUZE/BANDAGES/DRESSINGS) ×1
BAG DECANTER FOR FLEXI CONT (MISCELLANEOUS) ×2 IMPLANT
BENZOIN TINCTURE PRP APPL 2/3 (GAUZE/BANDAGES/DRESSINGS) ×2 IMPLANT
BLADE SURG 11 STRL SS (BLADE) ×2 IMPLANT
BLADE SURG 15 STRL LF DISP TIS (BLADE) ×2 IMPLANT
BLADE SURG 15 STRL SS (BLADE) ×1
CANISTER SUCT 1200ML W/VALVE (MISCELLANEOUS) IMPLANT
CHLORAPREP W/TINT 26 (MISCELLANEOUS) ×2 IMPLANT
COVER BACK TABLE 60X90IN (DRAPES) ×2 IMPLANT
COVER MAYO STAND STRL (DRAPES) ×2 IMPLANT
COVER PROBE 5X48 (MISCELLANEOUS)
DERMABOND ADVANCED .7 DNX12 (GAUZE/BANDAGES/DRESSINGS) ×2 IMPLANT
DRAPE C-ARM 42X72 X-RAY (DRAPES) ×2 IMPLANT
DRAPE LAPAROSCOPIC ABDOMINAL (DRAPES) ×2 IMPLANT
DRAPE UTILITY XL STRL (DRAPES) ×2 IMPLANT
DRSG TEGADERM 4X4.75 (GAUZE/BANDAGES/DRESSINGS) IMPLANT
ELECT COATED BLADE 2.86 ST (ELECTRODE) ×2 IMPLANT
ELECT REM PT RETURN 9FT ADLT (ELECTROSURGICAL) ×1
ELECTRODE REM PT RTRN 9FT ADLT (ELECTROSURGICAL) ×2 IMPLANT
GAUZE 4X4 16PLY ~~LOC~~+RFID DBL (SPONGE) ×2 IMPLANT
GAUZE SPONGE 4X4 12PLY STRL LF (GAUZE/BANDAGES/DRESSINGS) ×2 IMPLANT
GLOVE BIO SURGEON STRL SZ7 (GLOVE) ×2 IMPLANT
GLOVE BIOGEL PI IND STRL 7.5 (GLOVE) ×2 IMPLANT
GOWN STRL REUS W/ TWL LRG LVL3 (GOWN DISPOSABLE) ×4 IMPLANT
GOWN STRL REUS W/TWL LRG LVL3 (GOWN DISPOSABLE) ×2
IV KIT MINILOC 20X1 SAFETY (NEEDLE) IMPLANT
KIT CVR 48X5XPRB PLUP LF (MISCELLANEOUS) IMPLANT
KIT PORT POWER 8FR ISP CVUE (Port) IMPLANT
NDL HYPO 25X1 1.5 SAFETY (NEEDLE) ×2 IMPLANT
NDL SAFETY ECLIP 18X1.5 (MISCELLANEOUS) IMPLANT
NEEDLE HYPO 25X1 1.5 SAFETY (NEEDLE) ×1 IMPLANT
PACK BASIN DAY SURGERY FS (CUSTOM PROCEDURE TRAY) ×2 IMPLANT
PENCIL SMOKE EVACUATOR (MISCELLANEOUS) ×2 IMPLANT
SLEEVE SCD COMPRESS KNEE MED (STOCKING) ×2 IMPLANT
SPIKE FLUID TRANSFER (MISCELLANEOUS) IMPLANT
STRIP CLOSURE SKIN 1/2X4 (GAUZE/BANDAGES/DRESSINGS) ×2 IMPLANT
SUT MNCRL AB 4-0 PS2 18 (SUTURE) ×2 IMPLANT
SUT PROLENE 2 0 SH DA (SUTURE) ×2 IMPLANT
SUT SILK 2 0 TIES 17X18 (SUTURE)
SUT SILK 2-0 18XBRD TIE BLK (SUTURE) IMPLANT
SUT VIC AB 3-0 SH 27 (SUTURE) ×1
SUT VIC AB 3-0 SH 27X BRD (SUTURE) ×2 IMPLANT
SYR 5ML LUER SLIP (SYRINGE) ×2 IMPLANT
SYR CONTROL 10ML LL (SYRINGE) ×2 IMPLANT
TOWEL GREEN STERILE FF (TOWEL DISPOSABLE) ×2 IMPLANT
TUBE CONNECTING 20X1/4 (TUBING) IMPLANT
YANKAUER SUCT BULB TIP NO VENT (SUCTIONS) IMPLANT

## 2022-06-19 NOTE — Anesthesia Procedure Notes (Signed)
Procedure Name: LMA Insertion Date/Time: 06/19/2022 1:10 PM  Performed by: Verita Lamb, CRNAPre-anesthesia Checklist: Patient identified, Emergency Drugs available, Suction available and Patient being monitored Patient Re-evaluated:Patient Re-evaluated prior to induction Oxygen Delivery Method: Circle system utilized Preoxygenation: Pre-oxygenation with 100% oxygen Induction Type: IV induction Ventilation: Mask ventilation without difficulty LMA: LMA inserted LMA Size: 4.0 Number of attempts: 1 Airway Equipment and Method: Bite block Placement Confirmation: positive ETCO2, CO2 detector and breath sounds checked- equal and bilateral Tube secured with: Tape Dental Injury: Teeth and Oropharynx as per pre-operative assessment

## 2022-06-19 NOTE — Transfer of Care (Signed)
Immediate Anesthesia Transfer of Care Note  Patient: Karina Ferguson  Procedure(s) Performed: PORT PLACEMENT WITH ULTRASOUND GUIDANCE (Right: Chest)  Patient Location: PACU  Anesthesia Type:General  Level of Consciousness: awake, alert  and oriented  Airway & Oxygen Therapy: Patient Spontanous Breathing and Patient connected to face mask oxygen  Post-op Assessment: Report given to RN and Post -op Vital signs reviewed and stable  Post vital signs: Reviewed and stable  Last Vitals:  Vitals Value Taken Time  BP    Temp    Pulse    Resp    SpO2      Last Pain:  Vitals:   06/19/22 1136  TempSrc: Oral  PainSc: 0-No pain      Patients Stated Pain Goal: 3 (35/46/56 8127)  Complications: No notable events documented.

## 2022-06-19 NOTE — Anesthesia Preprocedure Evaluation (Addendum)
Anesthesia Evaluation  Patient identified by MRN, date of birth, ID band Patient awake    Reviewed: Allergy & Precautions, NPO status , Patient's Chart, lab work & pertinent test results  Airway Mallampati: III  TM Distance: >3 FB Neck ROM: Full    Dental no notable dental hx.    Pulmonary sleep apnea and Continuous Positive Airway Pressure Ventilation ,    Pulmonary exam normal        Cardiovascular hypertension, Pt. on medications and Pt. on home beta blockers Normal cardiovascular exam     Neuro/Psych Anxiety  Neuromuscular disease    GI/Hepatic Neg liver ROS, GERD  Medicated and Controlled,  Endo/Other  negative endocrine ROS  Renal/GU Renal disease     Musculoskeletal  (+) Arthritis ,   Abdominal (+) + obese,   Peds  Hematology negative hematology ROS (+)   Anesthesia Other Findings BREAST CANCER  Reproductive/Obstetrics                            Anesthesia Physical Anesthesia Plan  ASA: 3  Anesthesia Plan: General   Post-op Pain Management:    Induction: Intravenous  PONV Risk Score and Plan: 3 and Ondansetron, Dexamethasone, Midazolam and Treatment may vary due to age or medical condition  Airway Management Planned: LMA  Additional Equipment:   Intra-op Plan:   Post-operative Plan: Extubation in OR  Informed Consent: I have reviewed the patients History and Physical, chart, labs and discussed the procedure including the risks, benefits and alternatives for the proposed anesthesia with the patient or authorized representative who has indicated his/her understanding and acceptance.     Dental advisory given  Plan Discussed with: CRNA  Anesthesia Plan Comments:         Anesthesia Quick Evaluation

## 2022-06-19 NOTE — Anesthesia Postprocedure Evaluation (Signed)
Anesthesia Post Note  Patient: Juanice Warburton  Procedure(s) Performed: PORT PLACEMENT WITH ULTRASOUND GUIDANCE (Right: Chest)     Patient location during evaluation: PACU Anesthesia Type: General Level of consciousness: awake Pain management: pain level controlled Vital Signs Assessment: post-procedure vital signs reviewed and stable Respiratory status: spontaneous breathing, nonlabored ventilation, respiratory function stable and patient connected to nasal cannula oxygen Cardiovascular status: blood pressure returned to baseline and stable Postop Assessment: no apparent nausea or vomiting Anesthetic complications: no   No notable events documented.  Last Vitals:  Vitals:   06/19/22 1400 06/19/22 1417  BP: 124/65 110/72  Pulse: 76 69  Resp: 18 18  Temp:  36.8 C  SpO2: 98% 97%    Last Pain:  Vitals:   06/19/22 1417  TempSrc: Oral  PainSc: 1                  Issabelle Mcraney P Kyree Adriano

## 2022-06-19 NOTE — Op Note (Signed)
Preoperative diagnosis: Right  tn breast cancer, need for venous access Postoperative diagnosis: Same as above Procedure: Right internal jugular port placement  Surgeon: Dr. Serita Grammes Anesthesia: General Estimated blood loss: Minimal Complications: None Drains: None Special count was correct at completion Disposition recovery stable condition  Indications: 66 year old female who has a history of 2004 left breast cancer. This was ductal carcinoma in situ and sounds like it was ER/PR negative. She was treated with lumpectomy and then MammoSite radiation. She has a stable mass at that site. She has been followed and has done well since then. She had a mammogram in March that was negative. Her density was read as C. Due to the letter that she received she requested an MRI after that. When she got the MRI she had 2 separate areas that were abnormal. There is some linear enhancement in the 12:00 region as well as an indeterminate mass in the lateral periareolar region of the right breast. There are no abnormal appearing lymph nodes. She went biopsy of both of these. The 12:00 lesion ends up being an invasive ductal carcinoma with DCIS that is grade 3. There is no LVI. This is a triple negative breast cancer with a Ki-67 of 60%. She is giong to do primary chemo.  Procedure: After informed consent was obtained the patient was taken to the operating room.  She was given antibiotics.  SCDs were in place.  She was placed under general anesthesia with an LMA.  She was then prepped and draped in the standard sterile surgical fashion.  Surgical timeout was then performed.  I identified the right IJ with the ultrasound.  I made a small nick in the skin.  I accessed the needle under ultrasound guidance.  The wire was placed.  The wire was in good position on the x-ray.  The ultrasound also showed the wire to be in the vein.  I then infiltrated Marcaine and lidocaine below the clavicle.  I made incision and  developed a pocket for the port.  I placed a Carroll tendon passer between the sites and brought the line through both of those.  I then placed the dilator under fluoroscopic vision over the wire.  This was in good position.  The wire and the dilator were then removed leaving the sheath in place.  The line was placed in the sheath.  The sheath was then removed.  I pulled the line back to be in the distal SVC.  This was all in good position.  I then connected the port and placed this in the pocket.  I sutured this into position with a 2-0 Prolene suture.  This flushed easily and and aspirated blood.  I then did 1 final x-ray.  I closed this with 3-0 Vicryl and 4-0 Monocryl.  A Steri-Strip was placed over the incision to protect it.  I She tolerated this well was extubated and transferred to recovery stable.

## 2022-06-19 NOTE — H&P (Signed)
66 year old female who has a history of 2004 left breast cancer. This was ductal carcinoma in situ and sounds like it was ER/PR negative. She was treated with lumpectomy and then MammoSite radiation. She has a stable mass at that site. She has been followed and has done well since then. She had a mammogram in March that was negative. Her density was read as C. Due to the letter that she received she requested an MRI after that. When she got the MRI she had 2 separate areas that were abnormal. There is some linear enhancement in the 12:00 region as well as an indeterminate mass in the lateral periareolar region of the right breast. There are no abnormal appearing lymph nodes. She went biopsy of both of these. The 12:00 lesion ends up being an invasive ductal carcinoma with DCIS that is grade 3. There is no LVI. This is a triple negative breast cancer with a Ki-67 of 60%. The other biopsy ends up being fibrocystic change that is negative. This is concordant.She is here with her family to discuss options. She did not have genetic testing. She has no mass or dc  Review of Systems: A complete review of systems was obtained from the patient. I have reviewed this information and discussed as appropriate with the patient. See HPI as well for other ROS.  Review of Systems  HENT: Positive for congestion.  Respiratory: Positive for cough and wheezing.  Gastrointestinal: Positive for abdominal pain.  Psychiatric/Behavioral: The patient is nervous/anxious.  All other systems reviewed and are negative.  Medical History: Past Medical History:  Diagnosis Date  Anxiety  Arthritis  Chronic kidney disease  GERD (gastroesophageal reflux disease)  History of cancer  Hypertension  Thyroid disease    Past Surgical History:  Procedure Laterality Date  ABDOMINAL HYSTERECTOMY W/ PARTIAL VAGINACTOMY  MASTECTOMY PARTIAL / LUMPECTOMY Left  mammosite  THYROIDECTOMY TOTAL  2011    Allergies  Allergen Reactions   Amoxicillin Shortness Of Breath and Other (See Comments)   Current Outpatient Medications on File Prior to Visit  Medication Sig Dispense Refill  amLODIPine (NORVASC) 10 MG tablet Take 1 tablet by mouth once daily  hydroCHLOROthiazide (HYDRODIURIL) 25 MG tablet  metoprolol succinate (TOPROL-XL) 100 MG XL tablet Take 1 tablet by mouth once daily   Family History  Problem Relation Age of Onset  High blood pressure (Hypertension) Mother  High blood pressure (Hypertension) Father  High blood pressure (Hypertension) Sister    Social History   Tobacco Use  Smoking Status Never  Smokeless Tobacco Never  Marital status: Married  Tobacco Use  Smoking status: Never  Smokeless tobacco: Never  Vaping Use  Vaping Use: Never used  Substance and Sexual Activity  Alcohol use: Yes  Drug use: Never   Objective:   Vitals:  06/14/22 0812  BP: (!) 138/92  Pulse: 99  Weight: (!) 106.8 kg (235 lb 6.4 oz)  Height: 175.3 cm (5' 9" )   Body mass index is 34.76 kg/m.  Physical Exam Vitals reviewed.  Constitutional:  Appearance: Normal appearance.  Chest:  Breasts: Right: No inverted nipple, mass or nipple discharge.  Left: Mass present. No inverted nipple or nipple discharge.  Comments: Large uoq mass c/w mammosite therapy Lymphadenopathy:  Upper Body:  Right upper body: No supraclavicular or axillary adenopathy.  Left upper body: No supraclavicular or axillary adenopathy.  Neurological:  Mental Status: She is alert.    Assessment and Plan:   Diagnoses and all orders for this visit:  Malignant neoplasm  of upper-outer quadrant of right breast in female, estrogen receptor negative   primary chemotherapy followed by lumpectomy/sn biopsy, port placement now, genetics appt, sozo  We discussed the staging and pathophysiology of breast cancer. We discussed all of the different options for treatment for breast cancer including surgery, chemotherapy, radiation therapy, Herceptin, and  antiestrogen therapy. At 19 mm would consider primary chemotherapy and we dsicussed rationale for that. Discussed port placement. Will setup for genetics and will also have her see Dr Lindi Adie tomorrow to see if he is agreeable We would then proceed with surgery after chemotherapy.  We discussed a sentinel lymph node biopsy when she does have surgery as she does not appear to having lymph node involvement right now. We discussed the performance of that with injection of tracer. We discussed that there is a chance of having a positive node with a sentinel lymph node biopsy and we will await the permanent pathology to make any other first further decisions in terms of her treatment. We discussed up to a 5% risk lifetime of chronic shoulder pain as well as lymphedema associated with a sentinel lymph node biopsy.  We discussed the options for treatment of the breast cancer which included lumpectomy versus a mastectomy. We discussed the performance of the lumpectomy with radioactive seed placement. We discussed a 5-10% chance of a positive margin requiring reexcision in the operating room. We also discussed that she will likely need radiation therapy if she undergoes lumpectomy. We discussed mastectomy and the postoperative care for that as well. Mastectomy can be followed by reconstruction. The decision for lumpectomy vs mastectomy has no impact on decision for chemotherapy. Most mastectomy patients will not need radiation therapy. We discussed that there is no difference in her survival whether she undergoes lumpectomy with radiation therapy or antiestrogen therapy versus a mastectomy. There is also no real difference between her recurrence in the breast.  We discussed the risks of operation including bleeding, infection, possible reoperation. She understands her further therapy will be based on what her stages at the time of her operation.

## 2022-06-19 NOTE — Discharge Instructions (Addendum)
PORT-A-CATH: POST OP INSTRUCTIONS  Always review your discharge instruction sheet given to you by the facility where your surgery was performed.   A prescription for pain medication may be given to you upon discharge. Take your pain medication as prescribed, if needed. If narcotic pain medicine is not needed, then you make take acetaminophen (Tylenol) After 5:45pm or ibuprofen (Advil) as needed.  Take your usually prescribed medications unless otherwise directed. If you need a refill on your pain medication, please contact our office. All narcotic pain medicine now requires a paper prescription.  Phoned in and fax refills are no longer allowed by law.  Prescriptions will not be filled after 5 pm or on weekends.  You should follow a light diet for the remainder of the day after your procedure. Most patients will experience some mild swelling and/or bruising in the area of the incision. It may take several days to resolve. It is common to experience some constipation if taking pain medication after surgery. Increasing fluid intake and taking a stool softener (such as Colace) will usually help or prevent this problem from occurring. A mild laxative (Milk of Magnesia or Miralax) should be taken according to package directions if there are no bowel movements after 48 hours.  Unless discharge instructions indicate otherwise, you may remove your bandages 48 hours after surgery, and you may shower at that time. You may have steri-strips (small white skin tapes) in place directly over the incision.  These strips should be left on the skin for 7-10 days.  If your surgeon used Dermabond (skin glue) on the incision, you may shower in 24 hours.  The glue will flake off over the next 2-3 weeks.  If your port is left accessed at the end of surgery (needle left in port), the dressing cannot get wet and should only by changed by a healthcare professional. When the port is no longer accessed (when the needle has been  removed), follow step 7.   ACTIVITIES:  Limit activity involving your arms for the next 72 hours. Do no strenuous exercise or activity for 1 week. You may drive when you are no longer taking prescription pain medication, you can comfortably wear a seatbelt, and you can maneuver your car. 10.You may need to see your doctor in the office for a follow-up appointment.  Please       check with your doctor.  11.When you receive a new Port-a-Cath, you will get a product guide and        ID card.  Please keep them in case you need them.  WHEN TO CALL YOUR DOCTOR [phone]): Fever over 101.0 Chills Continued bleeding from incision Increased redness and tenderness at the site Shortness of breath, difficulty breathing   The clinic staff is available to answer your questions during regular business hours. Please don't hesitate to call and ask to speak to one of the nurses or medical assistants for clinical concerns. If you have a medical emergency, go to the nearest emergency room or call 911.  A surgeon from [facility] is always on call at the hospital.     For further information, please visit [email or web address]     Post Anesthesia Home Care Instructions  Activity: Get plenty of rest for the remainder of the day. A responsible individual must stay with you for 24 hours following the procedure.  For the next 24 hours, DO NOT: -Drive a car -Paediatric nurse -Drink alcoholic beverages -Take any medication unless instructed  by your physician -Make any legal decisions or sign important papers.  Meals: Start with liquid foods such as gelatin or soup. Progress to regular foods as tolerated. Avoid greasy, spicy, heavy foods. If nausea and/or vomiting occur, drink only clear liquids until the nausea and/or vomiting subsides. Call your physician if vomiting continues.  Special Instructions/Symptoms: Your throat may feel dry or sore from the anesthesia or the  breathing tube placed in your throat during surgery. If this causes discomfort, gargle with warm salt water. The discomfort should disappear within 24 hours.  If you had a scopolamine patch placed behind your ear for the management of post- operative nausea and/or vomiting:  1. The medication in the patch is effective for 72 hours, after which it should be removed.  Wrap patch in a tissue and discard in the trash. Wash hands thoroughly with soap and water. 2. You may remove the patch earlier than 72 hours if you experience unpleasant side effects which may include dry mouth, dizziness or visual disturbances. 3. Avoid touching the patch. Wash your hands with soap and water after contact with the patch.

## 2022-06-19 NOTE — Interval H&P Note (Signed)
History and Physical Interval Note:  06/19/2022 12:32 PM  Karina Ferguson  has presented today for surgery, with the diagnosis of BREAST CANCER.  The various methods of treatment have been discussed with the patient and family. After consideration of risks, benefits and other options for treatment, the patient has consented to  Procedure(s): PORT PLACEMENT WITH ULTRASOUND GUIDANCE (N/A) as a surgical intervention.  The patient's history has been reviewed, patient examined, no change in status, stable for surgery.  I have reviewed the patient's chart and labs.  Questions were answered to the patient's satisfaction.     Rolm Bookbinder

## 2022-06-20 ENCOUNTER — Encounter: Payer: Self-pay | Admitting: *Deleted

## 2022-06-20 ENCOUNTER — Encounter (HOSPITAL_BASED_OUTPATIENT_CLINIC_OR_DEPARTMENT_OTHER): Payer: Self-pay | Admitting: General Surgery

## 2022-06-20 ENCOUNTER — Ambulatory Visit: Payer: Medicare Other | Admitting: Nurse Practitioner

## 2022-06-20 ENCOUNTER — Other Ambulatory Visit: Payer: Self-pay

## 2022-06-20 ENCOUNTER — Telehealth: Payer: Self-pay | Admitting: *Deleted

## 2022-06-20 ENCOUNTER — Telehealth: Payer: Self-pay | Admitting: Hematology and Oncology

## 2022-06-20 NOTE — Telephone Encounter (Signed)
Scheduled appointment per WQ. Patient is aware. 

## 2022-06-20 NOTE — Telephone Encounter (Signed)
[id number] - TREATMENT OF REFRACTORY NAUSEA  Patient declined participation in this study.  She is concerned about the blood collection required and does not want to give that amount of blood (35 ml) in addition to her standard of care labs prior to starting treatment. Dr. Lindi Adie notified.  Foye Spurling, BSN, RN, [place] Nurse II [phone] 06/20/2022 11:38 AM

## 2022-06-20 NOTE — Telephone Encounter (Signed)
Called patient to follow up on 2 research studies introduced last week.  Patient had questions related to her lab results done yesterday before her Port a Cath.  She noticed her kidney function is abnormal. She says she thinks her kidney function may be worse than it has been in the past. She wants to make sure Dr. Lindi Adie is aware as she is concerned about the potential effects of chemotherapy on her kidneys.  She was also concerned about her blood sugar and wants Dr. Lindi Adie to know it was also elevated yesterday.   Informed patient that this nurse will send message to Dr. Lindi Adie. She knows that her labs will be checked and she will see Dr. Lindi Adie next week before she starts chemotherapy but she doesn't want any last minute surprises.

## 2022-06-20 NOTE — Telephone Encounter (Signed)
[employer] 2021-05 - Specimen Collection Study to Evaluate Biomarkers in Subjects with Cancer   Patient declined participation in this study as she was concerned about giving the amount of study required blood along with the standard of care labs prior to starting treatment.  Thanked patient for her willingness to discuss this study. Dr. Lindi Adie notified.  Foye Spurling, BSN, RN, [place] Nurse II [phone] 06/20/2022 11:36 AM

## 2022-06-21 ENCOUNTER — Other Ambulatory Visit: Payer: Self-pay

## 2022-06-21 ENCOUNTER — Ambulatory Visit (HOSPITAL_COMMUNITY): Payer: Medicare Other

## 2022-06-21 ENCOUNTER — Other Ambulatory Visit (HOSPITAL_COMMUNITY): Payer: Medicare Other

## 2022-06-21 NOTE — Therapy (Signed)
OUTPATIENT PHYSICAL THERAPY BREAST CANCER BASELINE EVALUATION   Patient Name: Karina Ferguson MRN: [id number] DOB:[birth year], 66 y.o., female Today's Date: 06/22/2022   PT End of Session - 06/22/22 1105     Visit Number 1    Number of Visits 2    Date for PT Re-Evaluation 12/07/22    PT Start Time 1100    PT Stop Time 1142    PT Time Calculation (min) 42 min    Activity Tolerance Patient tolerated treatment well    Behavior During Therapy [facility] for tasks assessed/performed             Past Medical History:  Diagnosis Date   Allergic rhinitis    Allergy    Anemia    Anxiety    Arthritis    Cancer ([place]) 04/2022   right breast IDC/DCIS   Chronic idiopathic constipation    GERD (gastroesophageal reflux disease)    Hyperlipidemia    Hypertension    Insomnia    Sleep apnea    wears CPAP nightly   Stage 3 chronic kidney disease ([place])    Thyroid disease    Vertigo    Past Surgical History:  Procedure Laterality Date   ABDOMINAL HYSTERECTOMY     Left breast cancer     PORTACATH PLACEMENT Right 06/19/2022   Procedure: PORT PLACEMENT WITH ULTRASOUND GUIDANCE;  Surgeon: Rolm Bookbinder, MD;  Location: [place];  Service: General;  Laterality: Right;   THYROIDECTOMY, PARTIAL     Patient Active Problem List   Diagnosis Date Noted   Malignant neoplasm of upper-outer quadrant of right breast in female, estrogen receptor negative ([place]) 06/15/2022   Moderate obstructive sleep apnea 02/17/2022   Snoring 10/04/2021   Allergic rhinitis 10/04/2021   Acute bacterial rhinosinusitis 08/31/2021   Upper airway cough syndrome 08/31/2021   Irritable bowel syndrome 04/11/2021   Impaired fasting glucose 02/12/2020   Keratoconjunctivitis sicca of both eyes not specified as Sjogren's 01/19/2020   Nuclear sclerotic cataract of both eyes 01/19/2020   Posterior vitreous detachment of both eyes 01/19/2020   Class 2 obesity 11/06/2017   Constipation 10/03/2015   Esophageal  reflux 10/03/2015   Essential hypertension 10/03/2015   History of breast cancer 10/03/2015   History of IBS 10/03/2015   History of orthostatic hypotension 10/03/2015   Hyperlipemia 10/03/2015   Insomnia 10/03/2015   Pain, joint, multiple sites 10/03/2015   S/P partial thyroidectomy 10/03/2015   Situational anxiety 10/03/2015   Stage 3 chronic kidney disease ([place]) 10/03/2015   Left lumbar radiculopathy 06/30/2014   Neurogenic claudication 06/30/2014   Other intervertebral disc degeneration, lumbar region 06/30/2014    PCP: Doug Sou, DO  REFERRING PROVIDER: Rolm Bookbinder MD  REFERRING DIAG: Right Breast Cancer  THERAPY DIAG:  Malignant neoplasm of upper-outer quadrant of right female breast, unspecified estrogen receptor status ([place])  Abnormal posture  Rationale for Evaluation and Treatment Rehabilitation  ONSET DATE: 06/05/2022  SUBJECTIVE:  SUBJECTIVE STATEMENT: Patient reports she is here today to be seen by her medical team for her newly diagnosed right breast cancer. 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 It measures 1.9 cm and is located in the upper-outer quadrant. 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 She will be having neoadjuvant chemotherapy starting next week. She had a routine mammogram which was negative, but requested an MRI because of C density breasts. She had a prior left Lumpectomy for DCIS in 2004  PATIENT GOALS:   reduce lymphedema risk and learn post op HEP.   PAIN:  Are you having pain? NoDiscomfort from where port was put in Monday  PRECAUTIONS: Active CA , kidney disease  HAND DOMINANCE: right  WEIGHT BEARING RESTRICTIONS: No  FALLS:  Has patient fallen in last 6 months?  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[place], crossword puzzles  PRIOR LEVEL OF FUNCTION: Independent   OBJECTIVE:  COGNITION: Overall cognitive status: Within functional limits for tasks assessed    POSTURE:  Forward head and rounded shoulders posture  UPPER EXTREMITY AROM/PROM:  A/PROM RIGHT   eval 06/22/2022  Shoulder extension 40  Shoulder flexion 145  Shoulder abduction 164  Shoulder internal rotation 68  Shoulder external rotation 105    (Blank rows = not tested)  A/PROM LEFT   Eval 06/22/2022  Shoulder extension 48  Shoulder flexion 147  Shoulder abduction 157  Shoulder internal rotation 68  Shoulder external rotation 102    (Blank rows = not tested)   CERVICAL AROM: All within functional limits: limited slightly by new portacath     UPPER EXTREMITY STRENGTH: WFL  LYMPHEDEMA ASSESSMENTS:   LANDMARK RIGHT   eval  10 cm proximal to olecranon process 37.5  Olecranon process 31.7  10 cm proximal to ulnar styloid process 27.3  Just proximal to ulnar styloid process 19.9  Across hand at thumb web space 21.7  At base of 2nd digit 7.0  (Blank rows = not tested)  LANDMARK LEFT   eval  10 cm proximal to olecranon process 37.2  Olecranon process 31.0  10 cm proximal to ulnar styloid process 25.0  Just proximal to ulnar styloid process 19.3  Across hand at thumb web space 20.4  At base of 2nd digit 6.9  (Blank rows = not tested)   L-DEX LYMPHEDEMA SCREENING:  The patient was assessed using the L-Dex machine today to produce a lymphedema index baseline score. The patient will be reassessed on a regular basis (typically every 3 months) to obtain new L-Dex scores. If the score is > 6.5 points away from his/her baseline score indicating onset of subclinical lymphedema, it will be recommended to wear a  compression garment for 4 weeks, 12 hours per day and then be reassessed. If the score continues to be > 6.5 points from baseline at reassessment, we will initiate lymphedema treatment. Assessing in this manner has a 95% rate of preventing clinically significant lymphedema.   L-DEX FLOWSHEETS - 06/22/22 1100       L-DEX LYMPHEDEMA SCREENING   Measurement Type Unilateral    L-DEX MEASUREMENT EXTREMITY Upper Extremity    POSITION  Standing    DOMINANT SIDE Right    At Risk Side Right    BASELINE SCORE (UNILATERAL) 3.6  QUICK DASH SURVEY: 30%  PATIENT EDUCATION:  Education details: Lymphedema risk reduction and post op shoulder/posture HEP Person educated: Patient Education method: Explanation, Demonstration, Handout Education comprehension: Patient verbalized understanding and returned demonstration  HOME EXERCISE PROGRAM: Patient was instructed today in a home exercise program today for post op shoulder range of motion. These included active assist shoulder flexion in sitting, scapular retraction, wall walking with shoulder abduction, and hands behind head external rotation.  She was encouraged to do these twice a day, holding 3 seconds and repeating 5 times when permitted by her physician.   ASSESSMENT:  CLINICAL IMPRESSION: Pts multidisciplinary medical team met prior to her assessments to determine a recommended treatment plan. She is planning to have neoadjuvant chemotherapy followed by a right Lumpectomy with SLNB. She will benefit from a post op PT reassessment to determine needs and from L-Dex screens every 3 months for 2 years to detect subclinical lymphedema.  Pt will benefit from skilled therapeutic intervention to improve on the following deficits: Decreased knowledge of precautions, impaired UE functional use, pain, decreased ROM, postural dysfunction.   PT treatment/interventions: ADL/self-care home management, pt/family education, therapeutic  exercise  REHAB POTENTIAL: Excellent  CLINICAL DECISION MAKING: Stable/uncomplicated  EVALUATION COMPLEXITY: Low   GOALS: Goals reviewed with patient? YES  LONG TERM GOALS: (STG=LTG)    Name Target Date Goal status  1 Pt will be able to verbalize understanding of pertinent lymphedema risk reduction practices relevant to her dx specifically related to skin care.  Baseline:  No knowledge 06/22/2022 Achieved at eval  2 Pt will be able to return demo and/or verbalize understanding of the post op HEP related to regaining shoulder ROM. Baseline:  No knowledge 06/22/2022 Achieved at eval  3 Pt will be able to verbalize understanding of the importance of attending the post op After Breast CA Class for further lymphedema risk reduction education and therapeutic exercise.  Baseline:  No knowledge 06/22/2022 Achieved at eval  4 Pt will demo she has regained full shoulder ROM and function post operatively compared to baselines.  Baseline: See objective measurements taken today. 12/07/2022      PLAN:  PT FREQUENCY/DURATION: EVAL and 1 follow up appointment.   PLAN FOR NEXT SESSION: will reassess 3-4 weeks post op to determine needs.   Patient will follow up at outpatient cancer rehab 3-4 weeks following surgery.  If the patient requires physical therapy at that time, a specific plan will be dictated and sent to the referring physician for approval. The patient was educated today on appropriate basic range of motion exercises to begin post operatively and the importance of attending the After Breast Cancer class following surgery.  Patient was educated today on lymphedema risk reduction practices as it pertains to recommendations that will benefit the patient immediately following surgery.  She verbalized good understanding.    Physical Therapy Information for After Breast Cancer Surgery/Treatment:  Lymphedema is a swelling condition that you may be at risk for in your arm if you have lymph  nodes removed from the armpit area.  After a sentinel node biopsy, the risk is approximately 5-9% and is higher after an axillary node dissection.  There is treatment available for this condition and it is not life-threatening.  Contact your physician or physical therapist with concerns. You may begin the 4 shoulder/posture exercises (see additional sheet) when permitted by your physician (typically a week after surgery).  If you have drains, you may need to wait until those are removed before beginning range  of motion exercises.  A general recommendation is to not lift your arms above shoulder height until drains are removed.  These exercises should be done to your tolerance and gently.  This is not a "no pain/no gain" type of recovery so listen to your body and stretch into the range of motion that you can tolerate, stopping if you have pain.  If you are having immediate reconstruction, ask your plastic surgeon about doing exercises as he or she may want you to wait. We encourage you to attend the free one time ABC (After Breast Cancer) class offered by [facility].  You will learn information related to lymphedema risk, prevention and treatment and additional exercises to regain mobility following surgery.  You can call [phone] for more information.  This is offered the 1st and 3rd Monday of each month.  You only attend the class one time. While undergoing any medical procedure or treatment, try to avoid blood pressure being taken or needle sticks from occurring on the arm on the side of cancer.   This recommendation begins after surgery and continues for the rest of your life.  This may help reduce your risk of getting lymphedema (swelling in your arm). An excellent resource for those seeking information on lymphedema is the National Lymphedema Network's web site. It can be accessed at [employer] If you notice swelling in your hand, arm or breast at any time following  surgery (even if it is many years from now), please contact your doctor or physical therapist to discuss this.  Lymphedema can be treated at any time but it is easier for you if it is treated early on.  If you feel like your shoulder motion is not returning to normal in a reasonable amount of time, please contact your surgeon or physical therapist.  [facility] [phone]. [address], [address], [place] [address]  ABC CLASS After Breast Cancer Class  After Breast Cancer Class is a specially designed exercise class to assist you in a safe recover after having breast cancer surgery.  In this class you will learn how to get back to full function whether your drains were just removed or if you had surgery a month ago.  This one-time class is held the 1st and 3rd Monday of every month from 11:00 a.m. until 12:00 noon virtually.  This class is FREE and space is limited. For more information or to register for the next available class, call [phone].  Class Goals  Understand specific stretches to improve the flexibility of you chest and shoulder. Learn ways to safely strengthen your upper body and improve your posture. Understand the warning signs of infection and why you may be at risk for an arm infection. Learn about Lymphedema and prevention.  ** You do not attend this class until after surgery.  Drains must be removed to participate  Patient was instructed today in a home exercise program today for post op shoulder range of motion. These included active assist shoulder flexion in sitting, scapular retraction, wall walking with shoulder abduction, and hands behind head external rotation.  She was encouraged to do these twice a day, holding 3 seconds and repeating 5 times when permitted by her physician.    Claris Pong, PT 06/22/2022, 11:50 AM

## 2022-06-22 ENCOUNTER — Other Ambulatory Visit: Payer: Self-pay

## 2022-06-22 ENCOUNTER — Ambulatory Visit: Payer: Medicare Other | Attending: General Surgery

## 2022-06-22 ENCOUNTER — Telehealth: Payer: Self-pay | Admitting: Hematology and Oncology

## 2022-06-22 DIAGNOSIS — C50411 Malignant neoplasm of upper-outer quadrant of right female breast: Secondary | ICD-10-CM | POA: Diagnosis present

## 2022-06-22 DIAGNOSIS — R293 Abnormal posture: Secondary | ICD-10-CM

## 2022-06-22 NOTE — Telephone Encounter (Signed)
Spoke to patient to figure out why Echo appointment was canceled, patient advised she was told by the surgery center when getting port placed that she no longer needed that appointment and to go ahead and cancel it. I confirmed with her that she needed an Echocardiogram and she said that's what she got and the surgery center told her they were giving her one before they placed her port so the appointment that was scheduled for the 25th was no longer needed and to go ahead and cancel it so she did. I wasn't able to find a report, so I sent a message to the Navigator to see if she could find out exactly what was done at the surgery center.

## 2022-06-22 NOTE — Progress Notes (Signed)
Pharmacist Chemotherapy Monitoring - Initial Assessment    Anticipated start date: 06/29/22   The following has been reviewed per standard work regarding the patient's treatment regimen: The patient's diagnosis, treatment plan and drug doses, and organ/hematologic function Lab orders and baseline tests specific to treatment regimen  The treatment plan start date, drug sequencing, and pre-medications Prior authorization status  Patient's documented medication list, including drug-drug interaction screen and prescriptions for anti-emetics and supportive care specific to the treatment regimen The drug concentrations, fluid compatibility, administration routes, and timing of the medications to be used The patient's access for treatment and lifetime cumulative dose history, if applicable  The patient's medication allergies and previous infusion related reactions, if applicable   Changes made to treatment plan:  N/A  Follow up needed:  Pending authorization for treatment  F/u Echo   Karina Ferguson, [place], 06/22/2022  9:45 AM

## 2022-06-23 ENCOUNTER — Other Ambulatory Visit (HOSPITAL_COMMUNITY): Payer: Medicare Other

## 2022-06-23 ENCOUNTER — Telehealth: Payer: Self-pay | Admitting: *Deleted

## 2022-06-23 NOTE — Telephone Encounter (Signed)
Spoke to pt concerning need for echo prior to chemo. Pt informed she was told by day surgery center that she had it done already and should cx the appt. Informed pt that she had EKG at the surgery center but not echo. Received verbal understanding. Echo scheduled and confirmed with pt on 10/30 at 9am. Confirmed chemo education class for 4pm. Denies further questions or needs at this time.

## 2022-06-24 NOTE — Progress Notes (Signed)
Patient Care Team: Nicola Girt, DO as PCP - General (Internal Medicine)  DIAGNOSIS: No diagnosis found.  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 female is here because of recent diagnosis of right breast cancer. She presents to the clinic today for a follow-up and first time [facility].   ALLERGIES:  is allergic to amoxicillin, esomeprazole magnesium, and rabeprazole.  MEDICATIONS:  Current Outpatient Medications  Medication Sig Dispense Refill   albuterol (VENTOLIN HFA) 108 (90 Base) MCG/ACT inhaler Inhale 1-2 puffs into the lungs every 4 (four) hours as needed for wheezing or shortness of breath. PRN 18 g 2   amLODipine (NORVASC) 10 MG tablet Take 1 tablet by mouth daily.     azelastine (ASTELIN) 0.1 % nasal spray Place 1 spray into both nostrils 2 (two) times daily. Use in each nostril as directed 30 mL 2   dexamethasone (DECADRON) 4 MG tablet Take 1 tablet day after chemo and 1 tablet 2 days after chemo with food 8 tablet 0   gabapentin (NEURONTIN) 100 MG capsule Take by mouth. PRN for back pain      hydrochlorothiazide (HYDRODIURIL) 25 MG tablet Take 25 mg by mouth daily.     hydrocortisone (ANUSOL-HC) 2.5 % rectal cream Bid prn hemorrhoids 30 g 1   lidocaine-prilocaine (EMLA) cream Apply to affected area once 30 g 3   LORazepam (ATIVAN) 0.5 MG tablet Take 1 tablet (0.5 mg total) by mouth every 8 (eight) hours as needed for anxiety. 30 tablet 0   metoprolol succinate (TOPROL-XL) 100 MG 24 hr tablet Take 50 mg by mouth daily.     montelukast (SINGULAIR) 10 MG tablet Take 10 mg by mouth daily.     Multiple Vitamin (MULTI-VITAMIN) tablet Take 1 tablet by mouth daily.     Omega-3 Fatty Acids (FISH OIL) 1000 MG CAPS Take by mouth.     omeprazole (PRILOSEC) 20 MG capsule Take 20 mg by mouth daily.     ondansetron (ZOFRAN) 8 MG tablet Take 1 tablet every 8 hours as needed for nausea and vomiting. Start on the third day after doxorubicin/cyclophosphamide or carboplatin. 30 tablet 1   prochlorperazine (COMPAZINE) 10 MG tablet Take 1 tablet (10 mg total) by mouth every 6 (six) hours as needed for nausea or vomiting. 30 tablet 1   pseudoephedrine-guaifenesin (MUCINEX D) 60-600 MG 12 hr tablet Take 1 tablet by mouth every 12 (twelve) hours.     traMADol (ULTRAM) 50 MG tablet Take 1 tablet (50 mg total) by mouth every 6 (six) hours as needed. 10 tablet 0   No current facility-administered medications for this  visit.    PHYSICAL EXAMINATION: ECOG PERFORMANCE STATUS: {CHL ONC ECOG PS:[phone]}  There were no vitals filed for this visit. There were no vitals filed for this visit.  BREAST:*** No palpable masses or nodules in either right or left breasts. No palpable axillary supraclavicular or infraclavicular adenopathy no breast tenderness or nipple discharge. (exam performed in the presence of a chaperone)  LABORATORY DATA:  I have reviewed the data as listed    Latest Ref Rng & Units 06/19/2022   11:42 AM 04/12/2021    2:49 PM 01/10/2021   11:00 AM  CMP  Glucose 70 - 99 mg/dL 107  97  152    BUN 8 - 23 mg/dL _0 Creatinine 0.44 - 1.00 mg/dL 1.18  1.02  1.16   Sodium 135 - 145 mmol/L 141  136  141   Potassium 3.5 - 5.1 mmol/L 3.6  3.5  3.6   Chloride 98 - 111 mmol/L 105  99  106   CO2 22 - 32 mmol/L _1 Calcium 8.9 - 10.3 mg/dL 10.4  10.5  10.2   Total Protein 6.0 - 8.3 g/dL  7.6  6.8   Total Bilirubin 0.2 - 1.2 mg/dL  0.6  0.6   Alkaline Phos 39 - 117 U/L  54  51   AST 0 - 37 U/L  22  21   ALT 0 - 35 U/L  20  19     Lab Results  Component Value Date   WBC 9.0 04/12/2021   HGB 13.7 04/12/2021   HCT 40.9 04/12/2021   MCV 88.2 04/12/2021   PLT 266.0 04/12/2021   NEUTROABS 6.1 04/12/2021    ASSESSMENT & PLAN:  No problem-specific Assessment & Plan notes found for this encounter.    No orders of the defined types were placed in this encounter.  The patient has a good understanding of the overall plan. she agrees with it. she will call with any problems that may develop before the next visit here. Total time spent: 30 mins including face to face time and time spent for planning, charting and co-ordination of care   Suzzette Righter, [place] 06/24/22    I Gardiner Coins am scribing for Dr. Lindi Adie  ***

## 2022-06-26 ENCOUNTER — Encounter: Payer: Self-pay | Admitting: Hematology and Oncology

## 2022-06-26 ENCOUNTER — Ambulatory Visit (HOSPITAL_COMMUNITY)
Admission: RE | Admit: 2022-06-26 | Discharge: 2022-06-26 | Disposition: A | Discharge status: Home / Self Care | Payer: Medicare Other | Source: Ambulatory Visit | Attending: Hematology and Oncology | Admitting: Hematology and Oncology

## 2022-06-26 ENCOUNTER — Other Ambulatory Visit: Payer: Self-pay

## 2022-06-26 ENCOUNTER — Other Ambulatory Visit (HOSPITAL_COMMUNITY): Payer: Medicare Other

## 2022-06-26 ENCOUNTER — Inpatient Hospital Stay: Payer: Medicare Other

## 2022-06-26 DIAGNOSIS — Z09 Encounter for follow-up examination after completed treatment for conditions other than malignant neoplasm: Secondary | ICD-10-CM | POA: Diagnosis not present

## 2022-06-26 DIAGNOSIS — C50411 Malignant neoplasm of upper-outer quadrant of right female breast: Secondary | ICD-10-CM | POA: Diagnosis not present

## 2022-06-26 DIAGNOSIS — I10 Essential (primary) hypertension: Secondary | ICD-10-CM | POA: Insufficient documentation

## 2022-06-26 DIAGNOSIS — E785 Hyperlipidemia, unspecified: Secondary | ICD-10-CM | POA: Diagnosis not present

## 2022-06-26 DIAGNOSIS — Z171 Estrogen receptor negative status [ER-]: Secondary | ICD-10-CM

## 2022-06-26 DIAGNOSIS — Z0189 Encounter for other specified special examinations: Secondary | ICD-10-CM | POA: Diagnosis not present

## 2022-06-26 LAB — ECHOCARDIOGRAM COMPLETE
AR max vel: 2.24 cm2
AV Area VTI: 2.26 cm2
AV Area mean vel: 2.18 cm2
AV Mean grad: 5 mmHg
AV Peak grad: 9.5 mmHg
Ao pk vel: 1.54 m/s
Area-P 1/2: 3.93 cm2
S' Lateral: 3.1 cm

## 2022-06-28 ENCOUNTER — Other Ambulatory Visit (HOSPITAL_COMMUNITY): Payer: Medicare Other

## 2022-06-28 MED FILL — Fosaprepitant Dimeglumine For IV Infusion 150 MG (Base Eq): INTRAVENOUS | Qty: 5 | Status: AC

## 2022-06-28 MED FILL — Dexamethasone Sodium Phosphate Inj 100 MG/10ML: INTRAMUSCULAR | Qty: 1 | Status: AC

## 2022-06-29 ENCOUNTER — Other Ambulatory Visit: Payer: Self-pay

## 2022-06-29 ENCOUNTER — Inpatient Hospital Stay (HOSPITAL_BASED_OUTPATIENT_CLINIC_OR_DEPARTMENT_OTHER): Payer: Medicare Other | Admitting: Hematology and Oncology

## 2022-06-29 ENCOUNTER — Encounter: Payer: Self-pay | Admitting: *Deleted

## 2022-06-29 ENCOUNTER — Inpatient Hospital Stay: Payer: Medicare Other

## 2022-06-29 ENCOUNTER — Inpatient Hospital Stay: Payer: Medicare Other | Attending: Genetic Counselor

## 2022-06-29 ENCOUNTER — Other Ambulatory Visit: Payer: Medicare Other

## 2022-06-29 VITALS — BP 145/75 | HR 88 | Temp 97.5°F | Resp 18 | Ht 69.0 in | Wt 233.0 lb

## 2022-06-29 DIAGNOSIS — Z5111 Encounter for antineoplastic chemotherapy: Secondary | ICD-10-CM | POA: Diagnosis not present

## 2022-06-29 DIAGNOSIS — C50411 Malignant neoplasm of upper-outer quadrant of right female breast: Secondary | ICD-10-CM

## 2022-06-29 DIAGNOSIS — Z452 Encounter for adjustment and management of vascular access device: Secondary | ICD-10-CM | POA: Insufficient documentation

## 2022-06-29 DIAGNOSIS — Z95828 Presence of other vascular implants and grafts: Secondary | ICD-10-CM

## 2022-06-29 DIAGNOSIS — G629 Polyneuropathy, unspecified: Secondary | ICD-10-CM | POA: Insufficient documentation

## 2022-06-29 DIAGNOSIS — Z171 Estrogen receptor negative status [ER-]: Secondary | ICD-10-CM | POA: Insufficient documentation

## 2022-06-29 DIAGNOSIS — Z5189 Encounter for other specified aftercare: Secondary | ICD-10-CM | POA: Insufficient documentation

## 2022-06-29 DIAGNOSIS — D6481 Anemia due to antineoplastic chemotherapy: Secondary | ICD-10-CM | POA: Diagnosis not present

## 2022-06-29 LAB — CMP (CANCER CENTER ONLY)
ALT: 17 U/L (ref 0–44)
AST: 17 U/L (ref 15–41)
Albumin: 4.2 g/dL (ref 3.5–5.0)
Alkaline Phosphatase: 46 U/L (ref 38–126)
Anion gap: 7 (ref 5–15)
BUN: 12 mg/dL (ref 8–23)
CO2: 26 mmol/L (ref 22–32)
Calcium: 9.9 mg/dL (ref 8.9–10.3)
Chloride: 106 mmol/L (ref 98–111)
Creatinine: 0.97 mg/dL (ref 0.44–1.00)
GFR, Estimated: >60 mL/min (ref >60)
Glucose, Bld: 106 mg/dL — ABNORMAL HIGH (ref 70–99)
Potassium: 3.5 mmol/L (ref 3.5–5.1)
Sodium: 139 mmol/L (ref 135–145)
Total Bilirubin: 0.5 mg/dL (ref 0.3–1.2)
Total Protein: 7.3 g/dL (ref 6.5–8.1)

## 2022-06-29 LAB — CBC WITH DIFFERENTIAL (CANCER CENTER ONLY)
Abs Immature Granulocytes: 0.03 10*3/uL (ref 0.00–0.07)
Basophils Absolute: 0 10*3/uL (ref 0.0–0.1)
Basophils Relative: 0 %
Eosinophils Absolute: 0.2 10*3/uL (ref 0.0–0.5)
Eosinophils Relative: 3 %
HCT: 35.5 % — ABNORMAL LOW (ref 36.0–46.0)
Hemoglobin: 12.3 g/dL (ref 12.0–15.0)
Immature Granulocytes: 0 %
Lymphocytes Relative: 22 %
Lymphs Abs: 1.5 10*3/uL (ref 0.7–4.0)
MCH: 30.1 pg (ref 26.0–34.0)
MCHC: 34.6 g/dL (ref 30.0–36.0)
MCV: 86.8 fL (ref 80.0–100.0)
Monocytes Absolute: 0.7 10*3/uL (ref 0.1–1.0)
Monocytes Relative: 10 %
Neutro Abs: 4.4 10*3/uL (ref 1.7–7.7)
Neutrophils Relative %: 65 %
Platelet Count: 222 10*3/uL (ref 150–400)
RBC: 4.09 MIL/uL (ref 3.87–5.11)
RDW: 13.5 % (ref 11.5–15.5)
WBC Count: 6.8 10*3/uL (ref 4.0–10.5)
nRBC: 0 % (ref 0.0–0.2)

## 2022-06-29 MED ORDER — PALONOSETRON HCL INJECTION 0.25 MG/5ML
0.2500 mg | Freq: Once | INTRAVENOUS | Status: AC
  Administered 2022-06-29: 0.25 mg via INTRAVENOUS
  Filled 2022-06-29: qty 5

## 2022-06-29 MED ORDER — DOXORUBICIN HCL CHEMO IV INJECTION 2 MG/ML
60.0000 mg/m2 | Freq: Once | INTRAVENOUS | Status: AC
  Administered 2022-06-29: 136 mg via INTRAVENOUS
  Filled 2022-06-29: qty 68

## 2022-06-29 MED ORDER — SODIUM CHLORIDE 0.9% FLUSH
10.0000 mL | INTRAVENOUS | Status: AC | PRN
  Administered 2022-06-29: 10 mL

## 2022-06-29 MED ORDER — SODIUM CHLORIDE 0.9 % IV SOLN
600.0000 mg/m2 | Freq: Once | INTRAVENOUS | Status: AC
  Administered 2022-06-29: 1360 mg via INTRAVENOUS
  Filled 2022-06-29: qty 68

## 2022-06-29 MED ORDER — SODIUM CHLORIDE 0.9 % IV SOLN: Freq: Once | INTRAVENOUS | Status: AC

## 2022-06-29 MED ORDER — HEPARIN SOD (PORK) LOCK FLUSH 100 UNIT/ML IV SOLN
500.0000 [IU] | Freq: Once | INTRAVENOUS | Status: AC | PRN
  Administered 2022-06-29: 500 [IU]

## 2022-06-29 MED ORDER — SODIUM CHLORIDE 0.9% FLUSH
10.0000 mL | INTRAVENOUS | Status: DC | PRN
  Administered 2022-06-29: 10 mL

## 2022-06-29 MED ORDER — SODIUM CHLORIDE 0.9 % IV SOLN
150.0000 mg | Freq: Once | INTRAVENOUS | Status: AC
  Administered 2022-06-29: 150 mg via INTRAVENOUS
  Filled 2022-06-29: qty 150

## 2022-06-29 MED ORDER — SODIUM CHLORIDE 0.9 % IV SOLN
10.0000 mg | Freq: Once | INTRAVENOUS | Status: AC
  Administered 2022-06-29: 10 mg via INTRAVENOUS
  Filled 2022-06-29: qty 10

## 2022-06-29 NOTE — Patient Instructions (Signed)
[facility]  Discharge Instructions: Thank you for choosing [facility] to provide your oncology and hematology care.   If you have a lab appointment with the [facility], please go directly to the [facility] and check in at the registration area.   Wear comfortable clothing and clothing appropriate for easy access to any Portacath or PICC line.   We strive to give you quality time with your provider. You may need to reschedule your appointment if you arrive late (15 or more minutes).  Arriving late affects you and other patients whose appointments are after yours.  Also, if you miss three or more appointments without notifying the office, you may be dismissed from the clinic at the provider's discretion.      For prescription refill requests, have your pharmacy contact our office and allow 72 hours for refills to be completed.    Today you received the following chemotherapy and/or immunotherapy agents: Doxorubicin, Cyclophosphamide.       To help prevent nausea and vomiting after your treatment, we encourage you to take your nausea medication as directed.  BELOW ARE SYMPTOMS THAT SHOULD BE REPORTED IMMEDIATELY: *FEVER GREATER THAN 100.4 F (38 C) OR HIGHER *CHILLS OR SWEATING *NAUSEA AND VOMITING THAT IS NOT CONTROLLED WITH YOUR NAUSEA MEDICATION *UNUSUAL SHORTNESS OF BREATH *UNUSUAL BRUISING OR BLEEDING *URINARY PROBLEMS (pain or burning when urinating, or frequent urination) *BOWEL PROBLEMS (unusual diarrhea, constipation, pain near the anus) TENDERNESS IN MOUTH AND THROAT WITH OR WITHOUT PRESENCE OF ULCERS (sore throat, sores in mouth, or a toothache) UNUSUAL RASH, SWELLING OR PAIN  UNUSUAL VAGINAL DISCHARGE OR ITCHING   Items with * indicate a potential emergency and should be followed up as soon as possible or go to the Emergency Department if any problems should occur.  Please show the CHEMOTHERAPY ALERT CARD or IMMUNOTHERAPY  ALERT CARD at check-in to the Emergency Department and triage nurse.  Should you have questions after your visit or need to cancel or reschedule your appointment, please contact [facility]  Dept: [phone]  and follow the prompts.  Office hours are 8:00 a.m. to 4:30 p.m. Monday - Friday. Please note that voicemails left after 4:00 p.m. may not be returned until the following business day.  We are closed weekends and major holidays. You have access to a nurse at all times for urgent questions. Please call the main number to the clinic Dept: [phone] and follow the prompts.   For any non-urgent questions, you may also contact your provider using MyChart. We now offer e-Visits for anyone 68 and older to request care online for non-urgent symptoms. For details visit [email or web address].   Also download the MyChart app! Go to the app store, search "MyChart", open the app, select [facility], and log in with your MyChart username and password.  Masks are optional in the cancer centers. If you would like for your care team to wear a mask while they are taking care of you, please let them know. You may have one support person who is at least 68 years old accompany you for your appointments. Doxorubicin Injection What is this medication? DOXORUBICIN (dox oh ROO bi sin) treats some types of cancer. It works by slowing down the growth of cancer cells. This medicine may be used for other purposes; ask your health care provider or pharmacist if you have questions. COMMON [place] NAME(S): Adriamycin, Adriamycin PFS, Adriamycin RDF, Rubex What should I tell my  care team before I take this medication? They need to know if you have any of these conditions: Heart disease History of low blood cell levels caused by a medication Liver disease Recent or ongoing radiation An unusual or allergic reaction to doxorubicin, other medications, foods, dyes, or preservatives If you or  your partner are pregnant or trying to get pregnant Breast-feeding How should I use this medication? This medication is injected into a vein. It is given by your care team in a hospital or clinic setting. Talk to your care team about the use of this medication in children. Special care may be needed. Overdosage: If you think you have taken too much of this medicine contact a poison control center or emergency room at once. NOTE: This medicine is only for you. Do not share this medicine with others. What if I miss a dose? Keep appointments for follow-up doses. It is important not to miss your dose. Call your care team if you are unable to keep an appointment. What may interact with this medication? 6-mercaptopurine Paclitaxel Phenytoin St. John's wort Trastuzumab Verapamil This list may not describe all possible interactions. Give your health care provider a list of all the medicines, herbs, non-prescription drugs, or dietary supplements you use. Also tell them if you smoke, drink alcohol, or use illegal drugs. Some items may interact with your medicine. What should I watch for while using this medication? Your condition will be monitored carefully while you are receiving this medication. You may need blood work while taking this medication. This medication may make you feel generally unwell. This is not uncommon as chemotherapy can affect healthy cells as well as cancer cells. Report any side effects. Continue your course of treatment even though you feel ill unless your care team tells you to stop. There is a maximum amount of this medication you should receive throughout your life. The amount depends on the medical condition being treated and your overall health. Your care team will watch how much of this medication you receive. Tell your care team if you have taken this medication before. Your urine may turn red for a few days after your dose. This is not blood. If your urine is dark or  brown, call your care team. In some cases, you may be given additional medications to help with side effects. Follow all directions for their use. This medication may increase your risk of getting an infection. Call your care team for advice if you get a fever, chills, sore throat, or other symptoms of a cold or flu. Do not treat yourself. Try to avoid being around people who are sick. This medication may increase your risk to bruise or bleed. Call your care team if you notice any unusual bleeding. Talk to your care team about your risk of cancer. You may be more at risk for certain types of cancers if you take this medication. You should make sure that you get enough Coenzyme Q10 while you are taking this medication. Discuss the foods you eat and the vitamins you take with your care team. Talk to your care team if you or your partner may be pregnant. Serious birth defects can occur if you take this medication during pregnancy and for 6 months after the last dose. Contraception is recommended while taking this medication and for 6 months after the last dose. Your care team can help you find the option that works for you. If your partner can get pregnant, use a condom while taking  this medication and for 6 months after the last dose. Do not breastfeed while taking this medication. This medication may cause infertility. Talk to your care team if you are concerned about your fertility. What side effects may I notice from receiving this medication? Side effects that you should report to your care team as soon as possible: Allergic reactions--skin rash, itching, hives, swelling of the face, lips, tongue, or throat Heart failure--shortness of breath, swelling of the ankles, feet, or hands, sudden weight gain, unusual weakness or fatigue Heart rhythm changes--fast or irregular heartbeat, dizziness, feeling faint or lightheaded, chest pain, trouble breathing Infection--fever, chills, cough, sore throat,  wounds that don't heal, pain or trouble when passing urine, general feeling of discomfort or being unwell Low red blood cell level--unusual weakness or fatigue, dizziness, headache, trouble breathing Painful swelling, warmth, or redness of the skin, blisters or sores at the infusion site Unusual bruising or bleeding Side effects that usually do not require medical attention (report to your care team if they continue or are bothersome): Diarrhea Hair loss Nausea Pain, redness, or swelling with sores inside the mouth or throat Red urine This list may not describe all possible side effects. Call your doctor for medical advice about side effects. You may report side effects to FDA at 1-800-FDA-1088. Where should I keep my medication? This medication is given in a hospital or clinic. It will not be stored at home. NOTE: This sheet is a summary. It may not cover all possible information. If you have questions about this medicine, talk to your doctor, pharmacist, or health care provider. Cyclophosphamide Injection What is this medication? CYCLOPHOSPHAMIDE (sye kloe FOSS fa mide) treats some types of cancer. It works by slowing down the growth of cancer cells. This medicine may be used for other purposes; ask your health care provider or pharmacist if you have questions. COMMON [place] NAME(S): Cyclophosphamide, Cytoxan, Neosar What should I tell my care team before I take this medication? They need to know if you have any of these conditions: Heart disease Irregular heartbeat or rhythm Infection Kidney problems Liver disease Low blood cell levels (white cells, platelets, or red blood cells) Lung disease Previous radiation Trouble passing urine An unusual or allergic reaction to cyclophosphamide, other medications, foods, dyes, or preservatives Pregnant or trying to get pregnant Breast-feeding How should I use this medication? This medication is injected into a vein. It is given by your care  team in a hospital or clinic setting. Talk to your care team about the use of this medication in children. Special care may be needed. Overdosage: If you think you have taken too much of this medicine contact a poison control center or emergency room at once. NOTE: This medicine is only for you. Do not share this medicine with others. What if I miss a dose? Keep appointments for follow-up doses. It is important not to miss your dose. Call your care team if you are unable to keep an appointment. What may interact with this medication? Amphotericin B Amiodarone Azathioprine Certain antivirals for HIV or hepatitis Certain medications for blood pressure, such as enalapril, lisinopril, quinapril Cyclosporine Diuretics Etanercept Indomethacin Medications that relax muscles Metronidazole Natalizumab Tamoxifen Warfarin This list may not describe all possible interactions. Give your health care provider a list of all the medicines, herbs, non-prescription drugs, or dietary supplements you use. Also tell them if you smoke, drink alcohol, or use illegal drugs. Some items may interact with your medicine. What should I watch for while using  this medication? This medication may make you feel generally unwell. This is not uncommon as chemotherapy can affect healthy cells as well as cancer cells. Report any side effects. Continue your course of treatment even though you feel ill unless your care team tells you to stop. You may need blood work while you are taking this medication. This medication may increase your risk of getting an infection. Call your care team for advice if you get a fever, chills, sore throat, or other symptoms of a cold or flu. Do not treat yourself. Try to avoid being around people who are sick. Avoid taking medications that contain aspirin, acetaminophen, ibuprofen, naproxen, or ketoprofen unless instructed by your care team. These medications may hide a fever. Be careful brushing  or flossing your teeth or using a toothpick because you may get an infection or bleed more easily. If you have any dental work done, tell your dentist you are receiving this medication. Drink water or other fluids as directed. Urinate often, even at night. Some products may contain alcohol. Ask your care team if this medication contains alcohol. Be sure to tell all care teams you are taking this medicine. Certain medicines, like metronidazole and disulfiram, can cause an unpleasant reaction when taken with alcohol. The reaction includes flushing, headache, nausea, vomiting, sweating, and increased thirst. The reaction can last from 30 minutes to several hours. Talk to your care team if you wish to become pregnant or think you might be pregnant. This medication can cause serious birth defects if taken during pregnancy and for 1 year after the last dose. A negative pregnancy test is required before starting this medication. A reliable form of contraception is recommended while taking this medication and for 1 year after the last dose. Talk to your care team about reliable forms of contraception. Do not father a child while taking this medication and for 4 months after the last dose. Use a condom during this time period. Do not breast-feed while taking this medication or for 1 week after the last dose. This medication may cause infertility. Talk to your care team if you are concerned about your fertility. Talk to your care team about your risk of cancer. You may be more at risk for certain types of cancer if you take this medication. What side effects may I notice from receiving this medication? Side effects that you should report to your care team as soon as possible: Allergic reactions--skin rash, itching, hives, swelling of the face, lips, tongue, or throat Dry cough, shortness of breath or trouble breathing Heart failure--shortness of breath, swelling of the ankles, feet, or hands, sudden weight gain,  unusual weakness or fatigue Heart muscle inflammation--unusual weakness or fatigue, shortness of breath, chest pain, fast or irregular heartbeat, dizziness, swelling of the ankles, feet, or hands Heart rhythm changes--fast or irregular heartbeat, dizziness, feeling faint or lightheaded, chest pain, trouble breathing Infection--fever, chills, cough, sore throat, wounds that don't heal, pain or trouble when passing urine, general feeling of discomfort or being unwell Kidney injury--decrease in the amount of urine, swelling of the ankles, hands, or feet Liver injury--right upper belly pain, loss of appetite, nausea, light-colored stool, dark yellow or brown urine, yellowing skin or eyes, unusual weakness or fatigue Low red blood cell level--unusual weakness or fatigue, dizziness, headache, trouble breathing Low sodium level--muscle weakness, fatigue, dizziness, headache, confusion Red or dark brown urine Unusual bruising or bleeding Side effects that usually do not require medical attention (report to your care team if  they continue or are bothersome): Hair loss Irregular menstrual cycles or spotting Loss of appetite Nausea Pain, redness, or swelling with sores inside the mouth or throat Vomiting This list may not describe all possible side effects. Call your doctor for medical advice about side effects. You may report side effects to FDA at 1-800-FDA-1088. Where should I keep my medication? This medication is given in a hospital or clinic. It will not be stored at home. NOTE: This sheet is a summary. It may not cover all possible information. If you have questions about this medicine, talk to your doctor, pharmacist, or health care provider.  2023 Elsevier/Gold Standard (2021-10-04 00:00:00)   2023 Elsevier/Gold Standard (2021-12-21 00:00:00)

## 2022-06-29 NOTE — Assessment & Plan Note (Signed)
Breast MRI performed because of high breast density 05/10/2022: Right breast linear enhancement 1.9 cm at 12 o'clock position, indeterminate mass 8 mm in the lateral periareolar region right breast. 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  Treatment plan: 1.  Neoadjuvant with dose dense Adriamycin and Cytoxan x4 followed by Taxol and carbo x12 2. breast conserving surgery 3.  Adjuvant radiation ------------------------------------------------------------------------------------------------------------------------------------------------------- Current treatment: Cycle 1 day 1 dose dense Adriamycin and Cytoxan Labs reviewed, chemo education completed, chemo consent obtained, antiemetics reviewed Echocardiogram 06/26/2022: EF 65 to 70%  Return to clinic in 1 week for toxicity check

## 2022-06-30 ENCOUNTER — Telehealth: Payer: Self-pay

## 2022-06-30 ENCOUNTER — Other Ambulatory Visit: Payer: Self-pay

## 2022-06-30 NOTE — Telephone Encounter (Signed)
Ms Band states that she is doing fine. She is eating, drinking and urinating well.  She knows to call the office at [phone] if she has any questions or concerns.

## 2022-06-30 NOTE — Telephone Encounter (Signed)
-----   Message from Rafael Bihari, RN sent at 06/29/2022  1:09 PM EDT ----- Regarding: Karina Ferguson pt, first time Doxorubicin/Cytoxan Dr Karina Ferguson pt came in 06/29/2022 for first time Doxorubicin/Cytoxan. Tolerated infusions well. Needs call back.

## 2022-07-01 ENCOUNTER — Inpatient Hospital Stay: Payer: Medicare Other

## 2022-07-01 VITALS — BP 138/73 | HR 80 | Temp 98.7°F | Resp 18

## 2022-07-01 DIAGNOSIS — C50411 Malignant neoplasm of upper-outer quadrant of right female breast: Secondary | ICD-10-CM

## 2022-07-01 DIAGNOSIS — Z5111 Encounter for antineoplastic chemotherapy: Secondary | ICD-10-CM | POA: Diagnosis not present

## 2022-07-01 MED ORDER — PEGFILGRASTIM-CBQV 6 MG/0.6ML ~~LOC~~ SOSY
6.0000 mg | PREFILLED_SYRINGE | Freq: Once | SUBCUTANEOUS | Status: AC
  Administered 2022-07-01: 6 mg via SUBCUTANEOUS
  Filled 2022-07-01: qty 0.6

## 2022-07-01 NOTE — Patient Instructions (Signed)

## 2022-07-05 NOTE — Progress Notes (Signed)
Patient Care Team: Nicola Girt, DO as PCP - General (Internal Medicine)  DIAGNOSIS: No diagnosis found.  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  MEDICATIONS:  Current Outpatient Medications  Medication Sig Dispense Refill   albuterol (VENTOLIN HFA) 108 (90 Base) MCG/ACT inhaler Inhale 1-2 puffs into the lungs every 4 (four) hours as needed for wheezing or shortness of breath. PRN 18 g 2   amLODipine (NORVASC) 10 MG tablet Take 1 tablet by mouth daily.     azelastine (ASTELIN) 0.1 % nasal spray Place 1 spray into both nostrils 2 (two) times daily. Use in each nostril as directed 30 mL 2   dexamethasone (DECADRON) 4 MG tablet Take 1 tablet day after chemo and 1 tablet 2 days after chemo with food 8 tablet 0   gabapentin (NEURONTIN) 100 MG capsule Take by mouth. PRN for back pain     hydrochlorothiazide (HYDRODIURIL) 25 MG tablet Take 25 mg by mouth daily.     hydrocortisone (ANUSOL-HC) 2.5 % rectal cream Bid prn hemorrhoids 30 g 1   lidocaine-prilocaine (EMLA)  cream Apply to affected area once 30 g 3   LORazepam (ATIVAN) 0.5 MG tablet Take 1 tablet (0.5 mg total) by mouth every 8 (eight) hours as needed for anxiety. 30 tablet 0   metoprolol succinate (TOPROL-XL) 100 MG 24 hr tablet Take 50 mg by mouth daily.     montelukast (SINGULAIR) 10 MG tablet Take 10 mg by mouth daily.     Multiple Vitamin (MULTI-VITAMIN) tablet Take 1 tablet by mouth daily.     Omega-3 Fatty Acids (FISH OIL) 1000 MG CAPS Take by mouth.     omeprazole (PRILOSEC) 20 MG capsule Take 20 mg by mouth daily.     ondansetron (ZOFRAN) 8 MG tablet Take 1 tablet every 8 hours as needed for nausea and vomiting. Start on the third day after doxorubicin/cyclophosphamide or carboplatin. 30 tablet 1   prochlorperazine (COMPAZINE) 10 MG tablet Take 1 tablet (10 mg total) by mouth every 6 (six) hours as needed for nausea or vomiting. 30 tablet 1   pseudoephedrine-guaifenesin (MUCINEX D) 60-600 MG 12 hr tablet Take 1 tablet by mouth every 12 (twelve) hours.     traMADol (ULTRAM) 50 MG tablet Take 1 tablet (50 mg total) by mouth every 6 (six) hours as needed. 10 tablet 0   No current facility-administered medications for this visit.    PHYSICAL EXAMINATION: ECOG PERFORMANCE STATUS: {CHL ONC ECOG [phone]}  There were no vitals filed for this visit. There were no vitals filed for this visit.  BREAST:*** No palpable masses or nodules in either right or left breasts. No palpable axillary supraclavicular or infraclavicular adenopathy no breast tenderness or nipple discharge. (exam performed in the presence of a chaperone)  LABORATORY DATA:  I have reviewed the data as listed    Latest Ref Rng & Units 06/29/2022    9:19 AM 06/19/2022   11:42 AM 04/12/2021    2:49 PM  CMP  Glucose 70 - 99 mg/dL 106  107  97   BUN 8 - 23 mg/dL _0 Creatinine 0.44 - 1.00 mg/dL 0.97  1.18  1.02   Sodium 135 - 145 mmol/L 139  141  136   Potassium 3.5 - 5.1 mmol/L 3.5  3.6  3.5   Chloride 98 - 111  mmol/L 106  105  99   CO2 22 - 32 mmol/L _1 Calcium 8.9 - 10.3 mg/dL 9.9  10.4  10.5   Total Protein 6.5 - 8.1 g/dL 7.3   7.6   Total Bilirubin 0.3 - 1.2 mg/dL 0.5   0.6   Alkaline Phos 38 - 126 U/L 46   54   AST 15 - 41 U/L 17   22   ALT 0 - 44 U/L 17   20     Lab Results  Component Value Date   WBC 6.8 06/29/2022   HGB 12.3 06/29/2022   HCT 35.5 (L) 06/29/2022   MCV 86.8 06/29/2022   PLT 222 06/29/2022   NEUTROABS 4.4 06/29/2022    ASSESSMENT & PLAN:  No problem-specific Assessment & Plan notes found for this encounter.    No orders of the defined types were placed in this encounter.  The patient has a good understanding of the overall plan. she agrees with it. she will call with any problems that may develop before the next visit here. Total time spent: 30 mins including face to face time and time spent for planning, charting and co-ordination of care   Suzzette Righter, [place] 07/05/22    I Gardiner Coins am scribing for Dr. Lindi Adie  ***

## 2022-07-06 ENCOUNTER — Other Ambulatory Visit: Payer: Self-pay

## 2022-07-06 ENCOUNTER — Inpatient Hospital Stay (HOSPITAL_BASED_OUTPATIENT_CLINIC_OR_DEPARTMENT_OTHER): Payer: Medicare Other | Admitting: Hematology and Oncology

## 2022-07-06 ENCOUNTER — Inpatient Hospital Stay: Payer: Medicare Other

## 2022-07-06 VITALS — BP 164/80 | HR 83 | Temp 97.5°F | Resp 18 | Ht 69.0 in | Wt 233.0 lb

## 2022-07-06 DIAGNOSIS — Z5111 Encounter for antineoplastic chemotherapy: Secondary | ICD-10-CM | POA: Diagnosis not present

## 2022-07-06 DIAGNOSIS — Z95828 Presence of other vascular implants and grafts: Secondary | ICD-10-CM

## 2022-07-06 DIAGNOSIS — Z171 Estrogen receptor negative status [ER-]: Secondary | ICD-10-CM

## 2022-07-06 DIAGNOSIS — C50411 Malignant neoplasm of upper-outer quadrant of right female breast: Secondary | ICD-10-CM

## 2022-07-06 LAB — COMPREHENSIVE METABOLIC PANEL
ALT: 14 U/L (ref 0–44)
AST: 12 U/L — ABNORMAL LOW (ref 15–41)
Albumin: 4 g/dL (ref 3.5–5.0)
Alkaline Phosphatase: 56 U/L (ref 38–126)
Anion gap: 6 (ref 5–15)
BUN: 11 mg/dL (ref 8–23)
CO2: 27 mmol/L (ref 22–32)
Calcium: 9.6 mg/dL (ref 8.9–10.3)
Chloride: 106 mmol/L (ref 98–111)
Creatinine, Ser: 0.86 mg/dL (ref 0.44–1.00)
GFR, Estimated: >60 mL/min (ref >60)
Glucose, Bld: 104 mg/dL — ABNORMAL HIGH (ref 70–99)
Potassium: 3.9 mmol/L (ref 3.5–5.1)
Sodium: 139 mmol/L (ref 135–145)
Total Bilirubin: 0.5 mg/dL (ref 0.3–1.2)
Total Protein: 6.7 g/dL (ref 6.5–8.1)

## 2022-07-06 LAB — CBC WITH DIFFERENTIAL/PLATELET
Abs Immature Granulocytes: 0.12 10*3/uL — ABNORMAL HIGH (ref 0.00–0.07)
Basophils Absolute: 0.1 10*3/uL (ref 0.0–0.1)
Basophils Relative: 2 %
Eosinophils Absolute: 0.1 10*3/uL (ref 0.0–0.5)
Eosinophils Relative: 4 %
HCT: 33.9 % — ABNORMAL LOW (ref 36.0–46.0)
Hemoglobin: 11.6 g/dL — ABNORMAL LOW (ref 12.0–15.0)
Immature Granulocytes: 4 %
Lymphocytes Relative: 24 %
Lymphs Abs: 0.8 10*3/uL (ref 0.7–4.0)
MCH: 30 pg (ref 26.0–34.0)
MCHC: 34.2 g/dL (ref 30.0–36.0)
MCV: 87.6 fL (ref 80.0–100.0)
Monocytes Absolute: 0.2 10*3/uL (ref 0.1–1.0)
Monocytes Relative: 7 %
Neutro Abs: 1.9 10*3/uL (ref 1.7–7.7)
Neutrophils Relative %: 59 %
Platelets: 145 10*3/uL — ABNORMAL LOW (ref 150–400)
RBC: 3.87 MIL/uL (ref 3.87–5.11)
RDW: 13.4 % (ref 11.5–15.5)
WBC: 3.1 10*3/uL — ABNORMAL LOW (ref 4.0–10.5)
nRBC: 0 % (ref 0.0–0.2)

## 2022-07-06 MED ORDER — SODIUM CHLORIDE 0.9% FLUSH
10.0000 mL | INTRAVENOUS | Status: DC | PRN
  Administered 2022-07-06: 10 mL via INTRAVENOUS

## 2022-07-06 NOTE — Assessment & Plan Note (Signed)
Breast MRI performed because of high breast density 05/10/2022: Right breast linear enhancement 1.9 cm at 12 o'clock position, indeterminate mass 8 mm in the lateral periareolar region right breast. 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   Treatment plan: 1.  Neoadjuvant with dose dense Adriamycin and Cytoxan x4 followed by Taxol and carbo x12 2. breast conserving surgery 3.  Adjuvant radiation ------------------------------------------------------------------------------------------------------------------------------------------------------- Current treatment: Cycle 1 day 8 dose dense Adriamycin and Cytoxan  Chemotoxicities:  Return to clinic in 1 week for cycle 2

## 2022-07-07 ENCOUNTER — Encounter: Payer: Self-pay | Admitting: *Deleted

## 2022-07-09 NOTE — Progress Notes (Unsigned)
Patient Care Team: Nicola Girt, DO as PCP - General (Internal Medicine)  DIAGNOSIS: No diagnosis found.  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 female is here because of recent diagnosis of right breast cancer currently on AC. She presents to the clinic today for a follow-up    ALLERGIES:  is allergic to amoxicillin, esomeprazole magnesium, and rabeprazole.  MEDICATIONS:  Current Outpatient Medications  Medication Sig Dispense Refill   albuterol (VENTOLIN HFA) 108 (90 Base) MCG/ACT inhaler Inhale 1-2 puffs into the lungs every 4 (four) hours as needed for wheezing or shortness of breath. PRN 18 g 2   amLODipine (NORVASC) 10 MG tablet Take 1 tablet by mouth daily.     azelastine (ASTELIN) 0.1 % nasal spray Place 1 spray into both nostrils 2 (two) times daily. Use in each nostril as directed 30 mL 2   cyanocobalamin (VITAMIN B12) 1000 MCG tablet Take 1 tablet by mouth daily.     dexamethasone (DECADRON) 4 MG tablet Take 1 tablet day after chemo and 1 tablet 2 days after chemo with food 8 tablet 0   gabapentin  (NEURONTIN) 100 MG capsule Take by mouth. PRN for back pain     hydrochlorothiazide (HYDRODIURIL) 25 MG tablet Take 25 mg by mouth daily.     hydrocortisone (ANUSOL-HC) 2.5 % rectal cream Bid prn hemorrhoids 30 g 1   lidocaine-prilocaine (EMLA) cream Apply to affected area once 30 g 3   LORazepam (ATIVAN) 0.5 MG tablet Take 1 tablet (0.5 mg total) by mouth every 8 (eight) hours as needed for anxiety. 30 tablet 0   metoprolol succinate (TOPROL-XL) 100 MG 24 hr tablet Take 50 mg by mouth daily.     montelukast (SINGULAIR) 10 MG tablet Take 10 mg by mouth daily.     Multiple Vitamin (MULTI-VITAMIN) tablet Take 1 tablet by mouth daily.     Omega-3 Fatty Acids (FISH OIL) 1000 MG CAPS Take by mouth.     omeprazole (PRILOSEC) 20 MG capsule Take 20 mg by mouth daily.     ondansetron (ZOFRAN) 8 MG tablet Take 1 tablet every 8 hours as needed for nausea and vomiting. Start on the third day after doxorubicin/cyclophosphamide or carboplatin. 30 tablet 1   prochlorperazine (COMPAZINE) 10 MG tablet Take 1 tablet (10 mg total) by mouth every 6 (six) hours as needed for nausea or vomiting. 30 tablet 1   pseudoephedrine-guaifenesin (MUCINEX D) 60-600 MG 12 hr tablet Take 1 tablet by mouth every 12 (twelve) hours.     traMADol (ULTRAM) 50 MG tablet Take 1 tablet (50 mg total) by mouth every  6 (six) hours as needed. 10 tablet 0   No current facility-administered medications for this visit.    PHYSICAL EXAMINATION: ECOG PERFORMANCE STATUS: {CHL ONC ECOG [phone]}  There were no vitals filed for this visit. There were no vitals filed for this visit.  BREAST:*** No palpable masses or nodules in either right or left breasts. No palpable axillary supraclavicular or infraclavicular adenopathy no breast tenderness or nipple discharge. (exam performed in the presence of a chaperone)  LABORATORY DATA:  I have reviewed the data as listed    Latest Ref Rng & Units 07/06/2022   10:29 AM 06/29/2022    9:19 AM  06/19/2022   11:42 AM  CMP  Glucose 70 - 99 mg/dL 104  106  107   BUN 8 - 23 mg/dL _0 Creatinine 0.44 - 1.00 mg/dL 0.86  0.97  1.18   Sodium 135 - 145 mmol/L 139  139  141   Potassium 3.5 - 5.1 mmol/L 3.9  3.5  3.6   Chloride 98 - 111 mmol/L 106  106  105   CO2 22 - 32 mmol/L _1 Calcium 8.9 - 10.3 mg/dL 9.6  9.9  10.4   Total Protein 6.5 - 8.1 g/dL 6.7  7.3    Total Bilirubin 0.3 - 1.2 mg/dL 0.5  0.5    Alkaline Phos 38 - 126 U/L 56  46    AST 15 - 41 U/L 12  17    ALT 0 - 44 U/L 14  17      Lab Results  Component Value Date   WBC 3.1 (L) 07/06/2022   HGB 11.6 (L) 07/06/2022   HCT 33.9 (L) 07/06/2022   MCV 87.6 07/06/2022   PLT 145 (L) 07/06/2022   NEUTROABS 1.9 07/06/2022    ASSESSMENT & PLAN:  No problem-specific Assessment & Plan notes found for this encounter.    No orders of the defined types were placed in this encounter.  The patient has a good understanding of the overall plan. she agrees with it. she will call with any problems that may develop before the next visit here. Total time spent: 30 mins including face to face time and time spent for planning, charting and co-ordination of care   Suzzette Righter, [place] 07/09/22    I Gardiner Coins am scribing for Dr. Lindi Adie  ***

## 2022-07-12 MED FILL — Dexamethasone Sodium Phosphate Inj 100 MG/10ML: INTRAMUSCULAR | Qty: 1 | Status: AC

## 2022-07-12 MED FILL — Fosaprepitant Dimeglumine For IV Infusion 150 MG (Base Eq): INTRAVENOUS | Qty: 5 | Status: AC

## 2022-07-13 ENCOUNTER — Inpatient Hospital Stay: Payer: Medicare Other

## 2022-07-13 ENCOUNTER — Other Ambulatory Visit: Payer: Self-pay

## 2022-07-13 ENCOUNTER — Inpatient Hospital Stay (HOSPITAL_BASED_OUTPATIENT_CLINIC_OR_DEPARTMENT_OTHER): Payer: Medicare Other | Admitting: Hematology and Oncology

## 2022-07-13 ENCOUNTER — Encounter: Payer: Self-pay | Admitting: *Deleted

## 2022-07-13 VITALS — BP 168/80 | HR 99 | Temp 97.3°F | Resp 18 | Ht 69.0 in | Wt 233.9 lb

## 2022-07-13 VITALS — BP 138/72 | HR 81 | Resp 16

## 2022-07-13 DIAGNOSIS — Z171 Estrogen receptor negative status [ER-]: Secondary | ICD-10-CM

## 2022-07-13 DIAGNOSIS — C50411 Malignant neoplasm of upper-outer quadrant of right female breast: Secondary | ICD-10-CM | POA: Diagnosis not present

## 2022-07-13 DIAGNOSIS — Z5111 Encounter for antineoplastic chemotherapy: Secondary | ICD-10-CM | POA: Diagnosis not present

## 2022-07-13 LAB — CBC WITH DIFFERENTIAL (CANCER CENTER ONLY)
Abs Immature Granulocytes: 0.67 10*3/uL — ABNORMAL HIGH (ref 0.00–0.07)
Basophils Absolute: 0.1 10*3/uL (ref 0.0–0.1)
Basophils Relative: 1 %
Eosinophils Absolute: 0 10*3/uL (ref 0.0–0.5)
Eosinophils Relative: 1 %
HCT: 34.3 % — ABNORMAL LOW (ref 36.0–46.0)
Hemoglobin: 11.8 g/dL — ABNORMAL LOW (ref 12.0–15.0)
Immature Granulocytes: 10 %
Lymphocytes Relative: 16 %
Lymphs Abs: 1.1 10*3/uL (ref 0.7–4.0)
MCH: 30.4 pg (ref 26.0–34.0)
MCHC: 34.4 g/dL (ref 30.0–36.0)
MCV: 88.4 fL (ref 80.0–100.0)
Monocytes Absolute: 0.7 10*3/uL (ref 0.1–1.0)
Monocytes Relative: 10 %
Neutro Abs: 4.2 10*3/uL (ref 1.7–7.7)
Neutrophils Relative %: 62 %
Platelet Count: 176 10*3/uL (ref 150–400)
RBC: 3.88 MIL/uL (ref 3.87–5.11)
RDW: 14.1 % (ref 11.5–15.5)
WBC Count: 6.7 10*3/uL (ref 4.0–10.5)
nRBC: 0 % (ref 0.0–0.2)

## 2022-07-13 LAB — CMP (CANCER CENTER ONLY)
ALT: 19 U/L (ref 0–44)
AST: 16 U/L (ref 15–41)
Albumin: 4.2 g/dL (ref 3.5–5.0)
Alkaline Phosphatase: 55 U/L (ref 38–126)
Anion gap: 9 (ref 5–15)
BUN: 11 mg/dL (ref 8–23)
CO2: 25 mmol/L (ref 22–32)
Calcium: 10 mg/dL (ref 8.9–10.3)
Chloride: 106 mmol/L (ref 98–111)
Creatinine: 0.92 mg/dL (ref 0.44–1.00)
GFR, Estimated: >60 mL/min (ref >60)
Glucose, Bld: 131 mg/dL — ABNORMAL HIGH (ref 70–99)
Potassium: 3.6 mmol/L (ref 3.5–5.1)
Sodium: 140 mmol/L (ref 135–145)
Total Bilirubin: 0.3 mg/dL (ref 0.3–1.2)
Total Protein: 7 g/dL (ref 6.5–8.1)

## 2022-07-13 MED ORDER — SODIUM CHLORIDE FLUSH 0.9 % IV SOLN
10.0000 mL | Freq: Once | INTRAVENOUS | Status: AC
  Administered 2022-07-13: 10 mL via INTRAVENOUS
  Filled 2022-07-13: qty 10

## 2022-07-13 MED ORDER — PALONOSETRON HCL INJECTION 0.25 MG/5ML
0.2500 mg | Freq: Once | INTRAVENOUS | Status: AC
  Administered 2022-07-13: 0.25 mg via INTRAVENOUS
  Filled 2022-07-13: qty 5

## 2022-07-13 MED ORDER — SODIUM CHLORIDE 0.9 % IV SOLN
10.0000 mg | Freq: Once | INTRAVENOUS | Status: AC
  Administered 2022-07-13: 10 mg via INTRAVENOUS
  Filled 2022-07-13: qty 10

## 2022-07-13 MED ORDER — DOXORUBICIN HCL CHEMO IV INJECTION 2 MG/ML
60.0000 mg/m2 | Freq: Once | INTRAVENOUS | Status: AC
  Administered 2022-07-13: 136 mg via INTRAVENOUS
  Filled 2022-07-13: qty 68

## 2022-07-13 MED ORDER — SODIUM CHLORIDE 0.9% FLUSH: 10.0000 mL | Freq: Once | INTRAVENOUS | Status: DC

## 2022-07-13 MED ORDER — SODIUM CHLORIDE 0.9 % IV SOLN: Freq: Once | INTRAVENOUS | Status: AC

## 2022-07-13 MED ORDER — SODIUM CHLORIDE FLUSH 0.9 % IV SOLN
10.0000 mL | Freq: Once | INTRAVENOUS | Status: DC
  Administered 2022-07-13: 10 mL via INTRAVENOUS

## 2022-07-13 MED ORDER — SODIUM CHLORIDE 0.9 % IV SOLN
600.0000 mg/m2 | Freq: Once | INTRAVENOUS | Status: AC
  Administered 2022-07-13: 1360 mg via INTRAVENOUS
  Filled 2022-07-13: qty 68

## 2022-07-13 MED ORDER — HEPARIN SOD (PORK) LOCK FLUSH 100 UNIT/ML IV SOLN
500.0000 [IU] | Freq: Once | INTRAVENOUS | Status: AC
  Administered 2022-07-13: 500 [IU] via INTRAVENOUS

## 2022-07-13 MED ORDER — SODIUM CHLORIDE 0.9 % IV SOLN
150.0000 mg | Freq: Once | INTRAVENOUS | Status: AC
  Administered 2022-07-13: 150 mg via INTRAVENOUS
  Filled 2022-07-13: qty 150

## 2022-07-13 NOTE — Assessment & Plan Note (Signed)
Breast MRI performed because of high breast density 05/10/2022: Right breast linear enhancement 1.9 cm at 12 o'clock position, indeterminate mass 8 mm in the lateral periareolar region right breast. 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   Treatment plan: 1.  Neoadjuvant with dose dense Adriamycin and Cytoxan x4 followed by Taxol and carbo x12 2. breast conserving surgery 3.  Adjuvant radiation ------------------------------------------------------------------------------------------------------------------------------------------------------- Current treatment: Cycle 2 dose dense Adriamycin and Cytoxan   Chemotoxicities: Mild fatigue Mild nausea Chemotherapy-induced anemia: Hemoglobin 11.6 today. Monitoring closely for toxicities   Return to clinic in 2 weeks for cycle 3

## 2022-07-13 NOTE — Patient Instructions (Signed)
[facility]  Discharge Instructions: Thank you for choosing [facility] to provide your oncology and hematology care.   If you have a lab appointment with the [facility], please go directly to the [facility] and check in at the registration area.   Wear comfortable clothing and clothing appropriate for easy access to any Portacath or PICC line.   We strive to give you quality time with your provider. You may need to reschedule your appointment if you arrive late (15 or more minutes).  Arriving late affects you and other patients whose appointments are after yours.  Also, if you miss three or more appointments without notifying the office, you may be dismissed from the clinic at the provider's discretion.      For prescription refill requests, have your pharmacy contact our office and allow 72 hours for refills to be completed.    Today you received the following chemotherapy and/or immunotherapy agents: Doxorubicin, Cyclophosphamide.       To help prevent nausea and vomiting after your treatment, we encourage you to take your nausea medication as directed.  BELOW ARE SYMPTOMS THAT SHOULD BE REPORTED IMMEDIATELY: *FEVER GREATER THAN 100.4 F (38 C) OR HIGHER *CHILLS OR SWEATING *NAUSEA AND VOMITING THAT IS NOT CONTROLLED WITH YOUR NAUSEA MEDICATION *UNUSUAL SHORTNESS OF BREATH *UNUSUAL BRUISING OR BLEEDING *URINARY PROBLEMS (pain or burning when urinating, or frequent urination) *BOWEL PROBLEMS (unusual diarrhea, constipation, pain near the anus) TENDERNESS IN MOUTH AND THROAT WITH OR WITHOUT PRESENCE OF ULCERS (sore throat, sores in mouth, or a toothache) UNUSUAL RASH, SWELLING OR PAIN  UNUSUAL VAGINAL DISCHARGE OR ITCHING   Items with * indicate a potential emergency and should be followed up as soon as possible or go to the Emergency Department if any problems should occur.  Please show the CHEMOTHERAPY ALERT CARD or IMMUNOTHERAPY  ALERT CARD at check-in to the Emergency Department and triage nurse.  Should you have questions after your visit or need to cancel or reschedule your appointment, please contact [facility]  Dept: [phone]  and follow the prompts.  Office hours are 8:00 a.m. to 4:30 p.m. Monday - Friday. Please note that voicemails left after 4:00 p.m. may not be returned until the following business day.  We are closed weekends and major holidays. You have access to a nurse at all times for urgent questions. Please call the main number to the clinic Dept: [phone] and follow the prompts.   For any non-urgent questions, you may also contact your provider using MyChart. We now offer e-Visits for anyone 68 and older to request care online for non-urgent symptoms. For details visit [email or web address].   Also download the MyChart app! Go to the app store, search "MyChart", open the app, select , and log in with your MyChart username and password.  Masks are optional in the cancer centers. If you would like for your care team to wear a mask while they are taking care of you, please let them know. You may have one support person who is at least 68 years old accompany you for your appointments. Doxorubicin Injection What is this medication? DOXORUBICIN (dox oh ROO bi sin) treats some types of cancer. It works by slowing down the growth of cancer cells. This medicine may be used for other purposes; ask your health care provider or pharmacist if you have questions. COMMON [place] NAME(S): Adriamycin, Adriamycin PFS, Adriamycin RDF, Rubex What should I tell my  care team before I take this medication? They need to know if you have any of these conditions: Heart disease History of low blood cell levels caused by a medication Liver disease Recent or ongoing radiation An unusual or allergic reaction to doxorubicin, other medications, foods, dyes, or preservatives If you or  your partner are pregnant or trying to get pregnant Breast-feeding How should I use this medication? This medication is injected into a vein. It is given by your care team in a hospital or clinic setting. Talk to your care team about the use of this medication in children. Special care may be needed. Overdosage: If you think you have taken too much of this medicine contact a poison control center or emergency room at once. NOTE: This medicine is only for you. Do not share this medicine with others. What if I miss a dose? Keep appointments for follow-up doses. It is important not to miss your dose. Call your care team if you are unable to keep an appointment. What may interact with this medication? 6-mercaptopurine Paclitaxel Phenytoin St. John's wort Trastuzumab Verapamil This list may not describe all possible interactions. Give your health care provider a list of all the medicines, herbs, non-prescription drugs, or dietary supplements you use. Also tell them if you smoke, drink alcohol, or use illegal drugs. Some items may interact with your medicine. What should I watch for while using this medication? Your condition will be monitored carefully while you are receiving this medication. You may need blood work while taking this medication. This medication may make you feel generally unwell. This is not uncommon as chemotherapy can affect healthy cells as well as cancer cells. Report any side effects. Continue your course of treatment even though you feel ill unless your care team tells you to stop. There is a maximum amount of this medication you should receive throughout your life. The amount depends on the medical condition being treated and your overall health. Your care team will watch how much of this medication you receive. Tell your care team if you have taken this medication before. Your urine may turn red for a few days after your dose. This is not blood. If your urine is dark or  brown, call your care team. In some cases, you may be given additional medications to help with side effects. Follow all directions for their use. This medication may increase your risk of getting an infection. Call your care team for advice if you get a fever, chills, sore throat, or other symptoms of a cold or flu. Do not treat yourself. Try to avoid being around people who are sick. This medication may increase your risk to bruise or bleed. Call your care team if you notice any unusual bleeding. Talk to your care team about your risk of cancer. You may be more at risk for certain types of cancers if you take this medication. You should make sure that you get enough Coenzyme Q10 while you are taking this medication. Discuss the foods you eat and the vitamins you take with your care team. Talk to your care team if you or your partner may be pregnant. Serious birth defects can occur if you take this medication during pregnancy and for 6 months after the last dose. Contraception is recommended while taking this medication and for 6 months after the last dose. Your care team can help you find the option that works for you. If your partner can get pregnant, use a condom while taking  this medication and for 6 months after the last dose. Do not breastfeed while taking this medication. This medication may cause infertility. Talk to your care team if you are concerned about your fertility. What side effects may I notice from receiving this medication? Side effects that you should report to your care team as soon as possible: Allergic reactions--skin rash, itching, hives, swelling of the face, lips, tongue, or throat Heart failure--shortness of breath, swelling of the ankles, feet, or hands, sudden weight gain, unusual weakness or fatigue Heart rhythm changes--fast or irregular heartbeat, dizziness, feeling faint or lightheaded, chest pain, trouble breathing Infection--fever, chills, cough, sore throat,  wounds that don't heal, pain or trouble when passing urine, general feeling of discomfort or being unwell Low red blood cell level--unusual weakness or fatigue, dizziness, headache, trouble breathing Painful swelling, warmth, or redness of the skin, blisters or sores at the infusion site Unusual bruising or bleeding Side effects that usually do not require medical attention (report to your care team if they continue or are bothersome): Diarrhea Hair loss Nausea Pain, redness, or swelling with sores inside the mouth or throat Red urine This list may not describe all possible side effects. Call your doctor for medical advice about side effects. You may report side effects to FDA at 1-800-FDA-1088. Where should I keep my medication? This medication is given in a hospital or clinic. It will not be stored at home. NOTE: This sheet is a summary. It may not cover all possible information. If you have questions about this medicine, talk to your doctor, pharmacist, or health care provider. Cyclophosphamide Injection What is this medication? CYCLOPHOSPHAMIDE (sye kloe FOSS fa mide) treats some types of cancer. It works by slowing down the growth of cancer cells. This medicine may be used for other purposes; ask your health care provider or pharmacist if you have questions. COMMON [place] NAME(S): Cyclophosphamide, Cytoxan, Neosar What should I tell my care team before I take this medication? They need to know if you have any of these conditions: Heart disease Irregular heartbeat or rhythm Infection Kidney problems Liver disease Low blood cell levels (white cells, platelets, or red blood cells) Lung disease Previous radiation Trouble passing urine An unusual or allergic reaction to cyclophosphamide, other medications, foods, dyes, or preservatives Pregnant or trying to get pregnant Breast-feeding How should I use this medication? This medication is injected into a vein. It is given by your care  team in a hospital or clinic setting. Talk to your care team about the use of this medication in children. Special care may be needed. Overdosage: If you think you have taken too much of this medicine contact a poison control center or emergency room at once. NOTE: This medicine is only for you. Do not share this medicine with others. What if I miss a dose? Keep appointments for follow-up doses. It is important not to miss your dose. Call your care team if you are unable to keep an appointment. What may interact with this medication? Amphotericin B Amiodarone Azathioprine Certain antivirals for HIV or hepatitis Certain medications for blood pressure, such as enalapril, lisinopril, quinapril Cyclosporine Diuretics Etanercept Indomethacin Medications that relax muscles Metronidazole Natalizumab Tamoxifen Warfarin This list may not describe all possible interactions. Give your health care provider a list of all the medicines, herbs, non-prescription drugs, or dietary supplements you use. Also tell them if you smoke, drink alcohol, or use illegal drugs. Some items may interact with your medicine. What should I watch for while using  this medication? This medication may make you feel generally unwell. This is not uncommon as chemotherapy can affect healthy cells as well as cancer cells. Report any side effects. Continue your course of treatment even though you feel ill unless your care team tells you to stop. You may need blood work while you are taking this medication. This medication may increase your risk of getting an infection. Call your care team for advice if you get a fever, chills, sore throat, or other symptoms of a cold or flu. Do not treat yourself. Try to avoid being around people who are sick. Avoid taking medications that contain aspirin, acetaminophen, ibuprofen, naproxen, or ketoprofen unless instructed by your care team. These medications may hide a fever. Be careful brushing  or flossing your teeth or using a toothpick because you may get an infection or bleed more easily. If you have any dental work done, tell your dentist you are receiving this medication. Drink water or other fluids as directed. Urinate often, even at night. Some products may contain alcohol. Ask your care team if this medication contains alcohol. Be sure to tell all care teams you are taking this medicine. Certain medicines, like metronidazole and disulfiram, can cause an unpleasant reaction when taken with alcohol. The reaction includes flushing, headache, nausea, vomiting, sweating, and increased thirst. The reaction can last from 30 minutes to several hours. Talk to your care team if you wish to become pregnant or think you might be pregnant. This medication can cause serious birth defects if taken during pregnancy and for 1 year after the last dose. A negative pregnancy test is required before starting this medication. A reliable form of contraception is recommended while taking this medication and for 1 year after the last dose. Talk to your care team about reliable forms of contraception. Do not father a child while taking this medication and for 4 months after the last dose. Use a condom during this time period. Do not breast-feed while taking this medication or for 1 week after the last dose. This medication may cause infertility. Talk to your care team if you are concerned about your fertility. Talk to your care team about your risk of cancer. You may be more at risk for certain types of cancer if you take this medication. What side effects may I notice from receiving this medication? Side effects that you should report to your care team as soon as possible: Allergic reactions--skin rash, itching, hives, swelling of the face, lips, tongue, or throat Dry cough, shortness of breath or trouble breathing Heart failure--shortness of breath, swelling of the ankles, feet, or hands, sudden weight gain,  unusual weakness or fatigue Heart muscle inflammation--unusual weakness or fatigue, shortness of breath, chest pain, fast or irregular heartbeat, dizziness, swelling of the ankles, feet, or hands Heart rhythm changes--fast or irregular heartbeat, dizziness, feeling faint or lightheaded, chest pain, trouble breathing Infection--fever, chills, cough, sore throat, wounds that don't heal, pain or trouble when passing urine, general feeling of discomfort or being unwell Kidney injury--decrease in the amount of urine, swelling of the ankles, hands, or feet Liver injury--right upper belly pain, loss of appetite, nausea, light-colored stool, dark yellow or brown urine, yellowing skin or eyes, unusual weakness or fatigue Low red blood cell level--unusual weakness or fatigue, dizziness, headache, trouble breathing Low sodium level--muscle weakness, fatigue, dizziness, headache, confusion Red or dark brown urine Unusual bruising or bleeding Side effects that usually do not require medical attention (report to your care team if  they continue or are bothersome): Hair loss Irregular menstrual cycles or spotting Loss of appetite Nausea Pain, redness, or swelling with sores inside the mouth or throat Vomiting This list may not describe all possible side effects. Call your doctor for medical advice about side effects. You may report side effects to FDA at 1-800-FDA-1088. Where should I keep my medication? This medication is given in a hospital or clinic. It will not be stored at home. NOTE: This sheet is a summary. It may not cover all possible information. If you have questions about this medicine, talk to your doctor, pharmacist, or health care provider.  2023 Elsevier/Gold Standard (2021-10-04 00:00:00)   2023 Elsevier/Gold Standard (2021-12-21 00:00:00)

## 2022-07-13 NOTE — Research (Signed)
Trial: DCP-001: Use of a Clinical Trial Screening Tool to Address Cancer Health Disparities in the [facility])    Patient Karina Ferguson was identified by this research nurse as a potential candidate for the above listed study.  This Clinical Research Nurse met with Karina Ferguson, [id number], on 07/13/22 in a manner and location that ensures patient privacy to discuss participation in the above listed research study.  Patient is Accompanied by her husband, Karina Ferguson .  A copy of the informed consent document and separate HIPAA Authorization was provided to the patient.  Patient reads, speaks, and understands [place].    Patient was provided with the business card of this Nurse and encouraged to contact the research team with any questions.  Patient was provided the option of taking informed consent documents home to review and was encouraged to review at their convenience with their support network, including other care providers. Patient is comfortable with making a decision regarding study participation today.  As outlined in the informed consent form, this Nurse and Otilio Miu discussed the purpose of the research study, the investigational nature of the study, study procedures and requirements for study participation, potential risks and benefits of study participation, as well as alternatives to participation. This study is not blinded. The patient understands participation is voluntary and they may withdraw from study participation at any time.  This study does not involve randomization.  This study does not involve an investigational drug or device. This study does not involve a placebo. Patient understands enrollment is pending full eligibility review.   Confidentiality and how the patient's information will be used as part of study participation were discussed.  Patient was informed there is not reimbursement provided for their time and effort spent on trial  participation.  The patient is encouraged to discuss research study participation with their insurance provider to determine what costs they may incur as part of study participation, including research related injury.    All questions were answered to patient's satisfaction.  The informed consent and separate HIPAA Authorization was reviewed page by page.  The patient's mental and emotional status is appropriate to provide informed consent, and the patient verbalizes an understanding of study participation.  Patient has agreed to participate in the above listed research study and has voluntarily signed the informed consent protocol version date 09/26/21 and separate HIPAA Authorization, version 15  on 07/13/22 at 12:35PM.  The patient was provided with a copy of the signed informed consent form and separate HIPAA Authorization for their reference.  No study specific procedures were obtained prior to the signing of the informed consent document.  Approximately 15 minutes were spent with the patient reviewing the informed consent documents.  Patient was not requested to complete a Release of Information form.   This Nurse has reviewed this patient's inclusion and exclusion criteria and confirmed Karina Ferguson is eligible for study participation.  Patient will continue with enrollment. Patient was interviewed to collect data for the study that is not available in the EMR.   Foye Spurling, BSN, RN, [facility] Nurse II [phone] 07/13/2022

## 2022-07-14 ENCOUNTER — Encounter: Payer: Self-pay | Admitting: Genetic Counselor

## 2022-07-14 ENCOUNTER — Telehealth: Payer: Self-pay | Admitting: Genetic Counselor

## 2022-07-14 DIAGNOSIS — Z1379 Encounter for other screening for genetic and chromosomal anomalies: Secondary | ICD-10-CM | POA: Insufficient documentation

## 2022-07-14 NOTE — Telephone Encounter (Signed)
I contacted Ms. Stogdill to discuss her genetic testing results. No pathogenic variants were identified in the 47 genes analyzed. 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 Detailed clinic note to follow.  The test report has been scanned into EPIC and is located under the Molecular Pathology section of the Results Review tab.  A portion of the result report is included below for reference.   Lucille Passy, MS, [facility] Genetic Counselor [email or web address] (P) [phone]

## 2022-07-15 ENCOUNTER — Inpatient Hospital Stay: Payer: Medicare Other

## 2022-07-15 VITALS — BP 145/73 | HR 81 | Temp 97.8°F | Resp 16

## 2022-07-15 DIAGNOSIS — Z5111 Encounter for antineoplastic chemotherapy: Secondary | ICD-10-CM | POA: Diagnosis not present

## 2022-07-15 DIAGNOSIS — Z171 Estrogen receptor negative status [ER-]: Secondary | ICD-10-CM

## 2022-07-15 MED ORDER — PEGFILGRASTIM-CBQV 6 MG/0.6ML ~~LOC~~ SOSY
6.0000 mg | PREFILLED_SYRINGE | Freq: Once | SUBCUTANEOUS | Status: AC
  Administered 2022-07-15: 6 mg via SUBCUTANEOUS

## 2022-07-17 ENCOUNTER — Ambulatory Visit: Payer: Self-pay | Admitting: Genetic Counselor

## 2022-07-17 DIAGNOSIS — Z1379 Encounter for other screening for genetic and chromosomal anomalies: Secondary | ICD-10-CM

## 2022-07-17 NOTE — Progress Notes (Signed)
HPI:   [name] was previously seen in the [facility] clinic due to a personal and family history of cancer and concerns regarding a hereditary predisposition to cancer. Please refer to our prior cancer genetics clinic note for more information regarding our discussion, assessment and recommendations, at the time. [name] recent genetic test results were disclosed to her, as were recommendations warranted by these results. These results and recommendations are discussed in more detail below.  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 Report date is 06/28/2022.   The Common Hereditary Cancers + RNA Panel offered by [facility] includes sequencing, deletion/duplication, and RNA testing of the following 47 genes: APC, ATM, AXIN2, BARD1, BMPR1A, BRCA1, BRCA2, BRIP1, CDH1, CDK4*, CDKN2A (p14ARF)*, CDKN2A (p16INK4a)*, CHEK2, CTNNA1, DICER1, EPCAM (Deletion/duplication testing only), GREM1 (promoter region deletion/duplication testing only), KIT, MEN1, MLH1, MSH2, MSH3, MSH6,  MUTYH, NBN, NF1, NHTL1, PALB2, PDGFRA*, PMS2, POLD1, POLE, PTEN, RAD50, RAD51C, RAD51D, SDHB, SDHC, SDHD, SMAD4, SMARCA4. STK11, TP53, TSC1, TSC2, and VHL.  The following genes were evaluated for sequence changes only: SDHA and HOXB13 c.251G>A variant only.  RNA analysis is not performed for the * genes.       FAMILY HISTORY:  We obtained a detailed, 4-generation family history.  Significant diagnoses are listed below: Family History  Problem Relation Age of Onset   Breast cancer Paternal Aunt    Lung cancer Paternal Uncle        he smoked   Lung cancer Paternal Uncle        he smoked   Colon cancer Neg Hx    Pancreatic cancer Neg Hx    Esophageal cancer Neg Hx    Stomach cancer Neg Hx    Rectal cancer Neg Hx          [name] has 4 paternal aunts and one was diagnosed with breast cancer at an unknown age, she is deceased. [name] has 5 paternal uncles and 2 died due to lung cancer, both smoked. [name] is unaware of previous family history of genetic testing for hereditary cancer risks. There is no reported Ashkenazi Jewish ancestry.    GENETIC TEST RESULTS:  The [facility] Common Cancer Panel found no pathogenic mutations.   The Common Hereditary Cancers + RNA Panel offered by [facility] includes sequencing, deletion/duplication, and RNA testing of the following 47 genes: APC, ATM, AXIN2, BARD1, BMPR1A, BRCA1, BRCA2, BRIP1, CDH1, CDK4*, CDKN2A (p14ARF)*, CDKN2A (p16INK4a)*, CHEK2, CTNNA1, DICER1, EPCAM (Deletion/duplication testing only), GREM1 (promoter region deletion/duplication testing only), KIT, MEN1, MLH1, MSH2, MSH3, MSH6, MUTYH, NBN, NF1,  NHTL1, PALB2, PDGFRA*, PMS2, POLD1, POLE, PTEN, RAD50, RAD51C, RAD51D, SDHB, SDHC, SDHD, SMAD4, SMARCA4. STK11, TP53, TSC1, TSC2, and VHL.  The following genes were evaluated for sequence changes only: SDHA and HOXB13 c.251G>A variant only.  RNA analysis is not performed for the * genes.    The test report has been scanned into EPIC and is  located under the Molecular Pathology section of the Results Review tab.  A portion of the result report is included below for reference. Genetic testing reported out on 06/28/2022.       Genetic testing identified a variant of uncertain significance (VUS) in the BRCA2 gene called c.5030G>T.  At this time, it is unknown if this variant is associated with an increased risk for cancer or if it is benign, but most uncertain variants are reclassified to benign. It should not be used to make medical management decisions. With time, we suspect the laboratory will determine the significance of this variant, if any. If the laboratory reclassifies this variant, we will attempt to contact [name] to discuss it further.   Even though a pathogenic variant was not identified, possible explanations for the cancer in the family may include: There may be no hereditary risk for cancer in the family. The cancers in [name] and/or her family may be due to other genetic or environmental factors. There may be a gene mutation in one of these genes that current testing methods cannot detect, but that chance is small. There could be another gene that has not yet been discovered, or that we have not yet tested, that is responsible for the cancer diagnoses in the family.  The variant of uncertain significance detected in the BRCA2 gene may be reclassified as a pathogenic variant in the future. At this time, we do not know if this variant increases the risk for cancer.  Therefore, it is important to remain in touch with cancer genetics in the future so that we can continue to offer [name] the most up to date genetic testing.   ADDITIONAL GENETIC TESTING:  We discussed with [name] that her genetic testing was fairly extensive.  If there are genes identified to increase cancer risk that can be analyzed in the future, we would be happy to discuss and coordinate this testing at that time.    CANCER SCREENING  RECOMMENDATIONS:  [name] test result is considered negative (normal).  This means that we have not identified a hereditary cause for her personal and family history of cancer at this time.   An individual's cancer risk and medical management are not determined by genetic test results alone. Overall cancer risk assessment incorporates additional factors, including personal medical history, family history, and any available genetic information that may result in a personalized plan for cancer prevention and surveillance. Therefore, it is recommended she continue to follow the cancer management and screening guidelines provided by her oncology and primary healthcare provider.  RECOMMENDATIONS FOR FAMILY MEMBERS:   Since she did not inherit a mutation in a cancer predisposition gene included on this panel, her children could not have inherited a mutation from her in one of these genes. Individuals in this family might be at some increased risk of developing cancer, over the general population risk, due to the family history of cancer. We recommend women in this family have a yearly mammogram beginning at age 23, or 69 years younger than the earliest onset of cancer, an annual clinical breast exam, and perform monthly breast  self-exams.  We do not recommend familial testing for the BRCA2 variant of uncertain significance (VUS).  FOLLOW-UP:  Cancer genetics is a rapidly advancing field and it is possible that new genetic tests will be appropriate for her and/or her family members in the future. We encouraged her to remain in contact with cancer genetics on an annual basis so we can update her personal and family histories and let her know of advances in cancer genetics that may benefit this family.   Our contact number was provided. [name] questions were answered to her satisfaction, and she knows she is welcome to call us at anytime with additional questions or concerns.   Lucille Passy, MS,  [facility] Genetic Counselor [email or web address] (P) [phone]

## 2022-07-25 NOTE — Progress Notes (Signed)
Patient Care Team: Nicola Girt, DO as PCP - General (Internal Medicine)  DIAGNOSIS: No diagnosis found.  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 Report date is 06/28/2022.   The Common Hereditary Cancers + RNA Panel offered by [facility] includes sequencing, deletion/duplication, and RNA testing of the following 47 genes: APC, ATM, AXIN2, BARD1, BMPR1A, BRCA1, BRCA2, BRIP1, CDH1, CDK4*, CDKN2A (p14ARF)*, CDKN2A (p16INK4a)*, CHEK2, CTNNA1, DICER1, EPCAM (Deletion/duplication testing only), GREM1 (promoter region deletion/duplication testing only), KIT, MEN1, MLH1, MSH2, MSH3, MSH6, MUTYH, NBN, NF1, NHTL1, PALB2, PDGFRA*, PMS2, POLD1, POLE, PTEN, RAD50, RAD51C, RAD51D, SDHB, SDHC, SDHD, SMAD4, SMARCA4. STK11, TP53, TSC1, TSC2, and VHL.  The following genes were evaluated for sequence changes only: SDHA and HOXB13 c.251G>A variant only.  RNA analysis is not performed for the * genes.       CHIEF COMPLIANT:   INTERVAL HISTORY: Naesha Buckalew is a 66 y.o. female is  here because of recent diagnosis of right breast cancer currently on [facility]. She presents to the clinic today for a follow-up.    ALLERGIES:  is allergic to amoxicillin, esomeprazole magnesium, and rabeprazole.  MEDICATIONS:  Current Outpatient Medications  Medication Sig Dispense Refill   albuterol (VENTOLIN HFA) 108 (90 Base) MCG/ACT inhaler Inhale 1-2 puffs into the lungs every 4 (four) hours as needed for wheezing or shortness of breath. PRN 18 g 2   amLODipine (NORVASC) 10 MG tablet Take 1 tablet by mouth daily.     azelastine (ASTELIN) 0.1 % nasal spray Place 1 spray into both nostrils 2 (two) times daily. Use in each nostril as directed 30 mL 2   cyanocobalamin (VITAMIN B12) 1000 MCG tablet Take 1 tablet by mouth daily.     dexamethasone (DECADRON) 4 MG tablet Take 1 tablet day after chemo and 1 tablet 2 days after chemo with food 8 tablet 0   gabapentin (NEURONTIN) 100 MG capsule Take by mouth. PRN for back pain     hydrochlorothiazide (HYDRODIURIL) 25 MG tablet Take 25 mg by mouth daily.     hydrocortisone (ANUSOL-HC) 2.5 % rectal cream Bid prn hemorrhoids 30 g 1   lidocaine-prilocaine (EMLA) cream Apply to affected area once 30 g 3   LORazepam (ATIVAN) 0.5 MG tablet Take 1 tablet (0.5 mg total) by mouth every 8 (eight) hours as needed for anxiety. 30 tablet 0   metoprolol succinate (TOPROL-XL) 100 MG 24 hr tablet Take 50 mg by mouth daily.     montelukast (SINGULAIR) 10 MG tablet Take 10 mg by mouth daily.     Multiple Vitamin (MULTI-VITAMIN)  tablet Take 1 tablet by mouth daily.     Omega-3 Fatty Acids (FISH OIL) 1000 MG CAPS Take by mouth.     omeprazole (PRILOSEC) 20 MG capsule Take 20 mg by mouth daily.     ondansetron (ZOFRAN) 8 MG tablet Take 1 tablet every 8 hours as needed for nausea and vomiting. Start on the third day after doxorubicin/cyclophosphamide or carboplatin. 30 tablet 1   prochlorperazine (COMPAZINE) 10 MG tablet Take 1 tablet (10 mg total) by mouth every 6 (six)  hours as needed for nausea or vomiting. 30 tablet 1   pseudoephedrine-guaifenesin (MUCINEX D) 60-600 MG 12 hr tablet Take 1 tablet by mouth every 12 (twelve) hours.     traMADol (ULTRAM) 50 MG tablet Take 1 tablet (50 mg total) by mouth every 6 (six) hours as needed. 10 tablet 0   No current facility-administered medications for this visit.    PHYSICAL EXAMINATION: ECOG PERFORMANCE STATUS: {CHL ONC ECOG PS:[id number]}  There were no vitals filed for this visit. There were no vitals filed for this visit.  BREAST:*** No palpable masses or nodules in either right or left breasts. No palpable axillary supraclavicular or infraclavicular adenopathy no breast tenderness or nipple discharge. (exam performed in the presence of a chaperone)  LABORATORY DATA:  I have reviewed the data as listed    Latest Ref Rng & Units 07/13/2022    9:25 AM 07/06/2022   10:29 AM 06/29/2022    9:19 AM  CMP  Glucose 70 - 99 mg/dL 131  104  106   BUN 8 - 23 mg/dL _0 Creatinine 0.44 - 1.00 mg/dL 0.92  0.86  0.97   Sodium 135 - 145 mmol/L 140  139  139   Potassium 3.5 - 5.1 mmol/L 3.6  3.9  3.5   Chloride 98 - 111 mmol/L 106  106  106   CO2 22 - 32 mmol/L _1 Calcium 8.9 - 10.3 mg/dL 10.0  9.6  9.9   Total Protein 6.5 - 8.1 g/dL 7.0  6.7  7.3   Total Bilirubin 0.3 - 1.2 mg/dL 0.3  0.5  0.5   Alkaline Phos 38 - 126 U/L 55  56  46   AST 15 - 41 U/L _2 ALT 0 - 44 U/L _3 Lab Results  Component Value Date   WBC 6.7 07/13/2022   HGB 11.8 (L) 07/13/2022   HCT 34.3 (L) 07/13/2022   MCV 88.4 07/13/2022   PLT 176 07/13/2022   NEUTROABS 4.2 07/13/2022    ASSESSMENT & PLAN:  No problem-specific Assessment & Plan notes found for this encounter.    No orders of the defined types were placed in this encounter.  The patient has a good understanding of the overall plan. she agrees with it. she will call with any problems that may develop before the next visit here. Total  time spent: 30 mins including face to face time and time spent for planning, charting and co-ordination of care   Suzzette Righter, [place] 07/25/22    I Gardiner Coins am scribing for Dr. Lindi Adie  ***

## 2022-07-26 MED FILL — Dexamethasone Sodium Phosphate Inj 100 MG/10ML: INTRAMUSCULAR | Qty: 1 | Status: AC

## 2022-07-26 MED FILL — Fosaprepitant Dimeglumine For IV Infusion 150 MG (Base Eq): INTRAVENOUS | Qty: 5 | Status: AC

## 2022-07-27 ENCOUNTER — Other Ambulatory Visit: Payer: Self-pay

## 2022-07-27 ENCOUNTER — Inpatient Hospital Stay (HOSPITAL_BASED_OUTPATIENT_CLINIC_OR_DEPARTMENT_OTHER): Payer: Medicare Other | Admitting: Hematology and Oncology

## 2022-07-27 ENCOUNTER — Inpatient Hospital Stay: Payer: Medicare Other

## 2022-07-27 ENCOUNTER — Encounter: Payer: Self-pay | Admitting: Hematology and Oncology

## 2022-07-27 VITALS — BP 155/79 | HR 96 | Temp 97.3°F | Resp 18 | Ht 69.0 in | Wt 229.5 lb

## 2022-07-27 VITALS — BP 130/70 | HR 82 | Resp 18

## 2022-07-27 DIAGNOSIS — Z171 Estrogen receptor negative status [ER-]: Secondary | ICD-10-CM

## 2022-07-27 DIAGNOSIS — C50411 Malignant neoplasm of upper-outer quadrant of right female breast: Secondary | ICD-10-CM | POA: Diagnosis not present

## 2022-07-27 DIAGNOSIS — Z95828 Presence of other vascular implants and grafts: Secondary | ICD-10-CM | POA: Insufficient documentation

## 2022-07-27 DIAGNOSIS — Z5111 Encounter for antineoplastic chemotherapy: Secondary | ICD-10-CM | POA: Diagnosis not present

## 2022-07-27 LAB — CBC WITH DIFFERENTIAL (CANCER CENTER ONLY)
Abs Immature Granulocytes: 0.8 10*3/uL — ABNORMAL HIGH (ref 0.00–0.07)
Basophils Absolute: 0.1 10*3/uL (ref 0.0–0.1)
Basophils Relative: 1 %
Eosinophils Absolute: 0.1 10*3/uL (ref 0.0–0.5)
Eosinophils Relative: 1 %
HCT: 33.7 % — ABNORMAL LOW (ref 36.0–46.0)
Hemoglobin: 11.2 g/dL — ABNORMAL LOW (ref 12.0–15.0)
Immature Granulocytes: 9 %
Lymphocytes Relative: 8 %
Lymphs Abs: 0.7 10*3/uL (ref 0.7–4.0)
MCH: 29.8 pg (ref 26.0–34.0)
MCHC: 33.2 g/dL (ref 30.0–36.0)
MCV: 89.6 fL (ref 80.0–100.0)
Monocytes Absolute: 1 10*3/uL (ref 0.1–1.0)
Monocytes Relative: 12 %
Neutro Abs: 6.3 10*3/uL (ref 1.7–7.7)
Neutrophils Relative %: 69 %
Platelet Count: 138 10*3/uL — ABNORMAL LOW (ref 150–400)
RBC: 3.76 MIL/uL — ABNORMAL LOW (ref 3.87–5.11)
RDW: 14.6 % (ref 11.5–15.5)
WBC Count: 9 10*3/uL (ref 4.0–10.5)
nRBC: 0 % (ref 0.0–0.2)

## 2022-07-27 LAB — CMP (CANCER CENTER ONLY)
ALT: 16 U/L (ref 0–44)
AST: 15 U/L (ref 15–41)
Albumin: 4.3 g/dL (ref 3.5–5.0)
Alkaline Phosphatase: 61 U/L (ref 38–126)
Anion gap: 8 (ref 5–15)
BUN: 11 mg/dL (ref 8–23)
CO2: 26 mmol/L (ref 22–32)
Calcium: 10.5 mg/dL — ABNORMAL HIGH (ref 8.9–10.3)
Chloride: 105 mmol/L (ref 98–111)
Creatinine: 0.89 mg/dL (ref 0.44–1.00)
GFR, Estimated: >60 mL/min (ref >60)
Glucose, Bld: 121 mg/dL — ABNORMAL HIGH (ref 70–99)
Potassium: 3.7 mmol/L (ref 3.5–5.1)
Sodium: 139 mmol/L (ref 135–145)
Total Bilirubin: 0.4 mg/dL (ref 0.3–1.2)
Total Protein: 7.1 g/dL (ref 6.5–8.1)

## 2022-07-27 MED ORDER — SODIUM CHLORIDE 0.9% FLUSH
10.0000 mL | Freq: Once | INTRAVENOUS | Status: AC
  Administered 2022-07-27: 10 mL

## 2022-07-27 MED ORDER — SODIUM CHLORIDE 0.9% FLUSH
10.0000 mL | INTRAVENOUS | Status: DC | PRN
  Administered 2022-07-27: 10 mL

## 2022-07-27 MED ORDER — PALONOSETRON HCL INJECTION 0.25 MG/5ML
0.2500 mg | Freq: Once | INTRAVENOUS | Status: AC
  Administered 2022-07-27: 0.25 mg via INTRAVENOUS
  Filled 2022-07-27: qty 5

## 2022-07-27 MED ORDER — DOXORUBICIN HCL CHEMO IV INJECTION 2 MG/ML
60.0000 mg/m2 | Freq: Once | INTRAVENOUS | Status: AC
  Administered 2022-07-27: 136 mg via INTRAVENOUS
  Filled 2022-07-27: qty 68

## 2022-07-27 MED ORDER — SODIUM CHLORIDE 0.9 % IV SOLN
10.0000 mg | Freq: Once | INTRAVENOUS | Status: AC
  Administered 2022-07-27: 10 mg via INTRAVENOUS
  Filled 2022-07-27: qty 10

## 2022-07-27 MED ORDER — HEPARIN SOD (PORK) LOCK FLUSH 100 UNIT/ML IV SOLN
500.0000 [IU] | Freq: Once | INTRAVENOUS | Status: AC | PRN
  Administered 2022-07-27: 500 [IU]

## 2022-07-27 MED ORDER — SODIUM CHLORIDE 0.9 % IV SOLN: Freq: Once | INTRAVENOUS | Status: AC

## 2022-07-27 MED ORDER — SODIUM CHLORIDE 0.9 % IV SOLN
600.0000 mg/m2 | Freq: Once | INTRAVENOUS | Status: AC
  Administered 2022-07-27: 1360 mg via INTRAVENOUS
  Filled 2022-07-27: qty 68

## 2022-07-27 MED ORDER — SODIUM CHLORIDE 0.9 % IV SOLN
150.0000 mg | Freq: Once | INTRAVENOUS | Status: AC
  Administered 2022-07-27: 150 mg via INTRAVENOUS
  Filled 2022-07-27: qty 150

## 2022-07-27 NOTE — Patient Instructions (Signed)
[facility]  Discharge Instructions: Thank you for choosing [facility] to provide your oncology and hematology care.   If you have a lab appointment with the [facility], please go directly to the [facility] and check in at the registration area.   Wear comfortable clothing and clothing appropriate for easy access to any Portacath or PICC line.   We strive to give you quality time with your provider. You may need to reschedule your appointment if you arrive late (15 or more minutes).  Arriving late affects you and other patients whose appointments are after yours.  Also, if you miss three or more appointments without notifying the office, you may be dismissed from the clinic at the provider's discretion.      For prescription refill requests, have your pharmacy contact our office and allow 72 hours for refills to be completed.    Today you received the following chemotherapy and/or immunotherapy agents: Doxorubicin, Cyclophosphamide.       To help prevent nausea and vomiting after your treatment, we encourage you to take your nausea medication as directed.  BELOW ARE SYMPTOMS THAT SHOULD BE REPORTED IMMEDIATELY: *FEVER GREATER THAN 100.4 F (38 C) OR HIGHER *CHILLS OR SWEATING *NAUSEA AND VOMITING THAT IS NOT CONTROLLED WITH YOUR NAUSEA MEDICATION *UNUSUAL SHORTNESS OF BREATH *UNUSUAL BRUISING OR BLEEDING *URINARY PROBLEMS (pain or burning when urinating, or frequent urination) *BOWEL PROBLEMS (unusual diarrhea, constipation, pain near the anus) TENDERNESS IN MOUTH AND THROAT WITH OR WITHOUT PRESENCE OF ULCERS (sore throat, sores in mouth, or a toothache) UNUSUAL RASH, SWELLING OR PAIN  UNUSUAL VAGINAL DISCHARGE OR ITCHING   Items with * indicate a potential emergency and should be followed up as soon as possible or go to the Emergency Department if any problems should occur.  Please show the CHEMOTHERAPY ALERT CARD or IMMUNOTHERAPY  ALERT CARD at check-in to the Emergency Department and triage nurse.  Should you have questions after your visit or need to cancel or reschedule your appointment, please contact [facility]  Dept: [phone]  and follow the prompts.  Office hours are 8:00 a.m. to 4:30 p.m. Monday - Friday. Please note that voicemails left after 4:00 p.m. may not be returned until the following business day.  We are closed weekends and major holidays. You have access to a nurse at all times for urgent questions. Please call the main number to the clinic Dept: [phone] and follow the prompts.   For any non-urgent questions, you may also contact your provider using MyChart. We now offer e-Visits for anyone 60 and older to request care online for non-urgent symptoms. For details visit [email or web address].   Also download the MyChart app! Go to the app store, search "MyChart", open the app, select , and log in with your MyChart username and password.  Masks are optional in the cancer centers. If you would like for your care team to wear a mask while they are taking care of you, please let them know. You may have one support person who is at least 68 years old accompany you for your appointments. Doxorubicin Injection What is this medication? DOXORUBICIN (dox oh ROO bi sin) treats some types of cancer. It works by slowing down the growth of cancer cells. This medicine may be used for other purposes; ask your health care provider or pharmacist if you have questions. COMMON [place] NAME(S): Adriamycin, Adriamycin PFS, Adriamycin RDF, Rubex What should I tell my  care team before I take this medication? They need to know if you have any of these conditions: Heart disease History of low blood cell levels caused by a medication Liver disease Recent or ongoing radiation An unusual or allergic reaction to doxorubicin, other medications, foods, dyes, or preservatives If you or  your partner are pregnant or trying to get pregnant Breast-feeding How should I use this medication? This medication is injected into a vein. It is given by your care team in a hospital or clinic setting. Talk to your care team about the use of this medication in children. Special care may be needed. Overdosage: If you think you have taken too much of this medicine contact a poison control center or emergency room at once. NOTE: This medicine is only for you. Do not share this medicine with others. What if I miss a dose? Keep appointments for follow-up doses. It is important not to miss your dose. Call your care team if you are unable to keep an appointment. What may interact with this medication? 6-mercaptopurine Paclitaxel Phenytoin St. John's wort Trastuzumab Verapamil This list may not describe all possible interactions. Give your health care provider a list of all the medicines, herbs, non-prescription drugs, or dietary supplements you use. Also tell them if you smoke, drink alcohol, or use illegal drugs. Some items may interact with your medicine. What should I watch for while using this medication? Your condition will be monitored carefully while you are receiving this medication. You may need blood work while taking this medication. This medication may make you feel generally unwell. This is not uncommon as chemotherapy can affect healthy cells as well as cancer cells. Report any side effects. Continue your course of treatment even though you feel ill unless your care team tells you to stop. There is a maximum amount of this medication you should receive throughout your life. The amount depends on the medical condition being treated and your overall health. Your care team will watch how much of this medication you receive. Tell your care team if you have taken this medication before. Your urine may turn red for a few days after your dose. This is not blood. If your urine is dark or  brown, call your care team. In some cases, you may be given additional medications to help with side effects. Follow all directions for their use. This medication may increase your risk of getting an infection. Call your care team for advice if you get a fever, chills, sore throat, or other symptoms of a cold or flu. Do not treat yourself. Try to avoid being around people who are sick. This medication may increase your risk to bruise or bleed. Call your care team if you notice any unusual bleeding. Talk to your care team about your risk of cancer. You may be more at risk for certain types of cancers if you take this medication. You should make sure that you get enough Coenzyme Q10 while you are taking this medication. Discuss the foods you eat and the vitamins you take with your care team. Talk to your care team if you or your partner may be pregnant. Serious birth defects can occur if you take this medication during pregnancy and for 6 months after the last dose. Contraception is recommended while taking this medication and for 6 months after the last dose. Your care team can help you find the option that works for you. If your partner can get pregnant, use a condom while taking  this medication and for 6 months after the last dose. Do not breastfeed while taking this medication. This medication may cause infertility. Talk to your care team if you are concerned about your fertility. What side effects may I notice from receiving this medication? Side effects that you should report to your care team as soon as possible: Allergic reactions--skin rash, itching, hives, swelling of the face, lips, tongue, or throat Heart failure--shortness of breath, swelling of the ankles, feet, or hands, sudden weight gain, unusual weakness or fatigue Heart rhythm changes--fast or irregular heartbeat, dizziness, feeling faint or lightheaded, chest pain, trouble breathing Infection--fever, chills, cough, sore throat,  wounds that don't heal, pain or trouble when passing urine, general feeling of discomfort or being unwell Low red blood cell level--unusual weakness or fatigue, dizziness, headache, trouble breathing Painful swelling, warmth, or redness of the skin, blisters or sores at the infusion site Unusual bruising or bleeding Side effects that usually do not require medical attention (report to your care team if they continue or are bothersome): Diarrhea Hair loss Nausea Pain, redness, or swelling with sores inside the mouth or throat Red urine This list may not describe all possible side effects. Call your doctor for medical advice about side effects. You may report side effects to FDA at 1-800-FDA-1088. Where should I keep my medication? This medication is given in a hospital or clinic. It will not be stored at home. NOTE: This sheet is a summary. It may not cover all possible information. If you have questions about this medicine, talk to your doctor, pharmacist, or health care provider. Cyclophosphamide Injection What is this medication? CYCLOPHOSPHAMIDE (sye kloe FOSS fa mide) treats some types of cancer. It works by slowing down the growth of cancer cells. This medicine may be used for other purposes; ask your health care provider or pharmacist if you have questions. COMMON [place] NAME(S): Cyclophosphamide, Cytoxan, Neosar What should I tell my care team before I take this medication? They need to know if you have any of these conditions: Heart disease Irregular heartbeat or rhythm Infection Kidney problems Liver disease Low blood cell levels (white cells, platelets, or red blood cells) Lung disease Previous radiation Trouble passing urine An unusual or allergic reaction to cyclophosphamide, other medications, foods, dyes, or preservatives Pregnant or trying to get pregnant Breast-feeding How should I use this medication? This medication is injected into a vein. It is given by your care  team in a hospital or clinic setting. Talk to your care team about the use of this medication in children. Special care may be needed. Overdosage: If you think you have taken too much of this medicine contact a poison control center or emergency room at once. NOTE: This medicine is only for you. Do not share this medicine with others. What if I miss a dose? Keep appointments for follow-up doses. It is important not to miss your dose. Call your care team if you are unable to keep an appointment. What may interact with this medication? Amphotericin B Amiodarone Azathioprine Certain antivirals for HIV or hepatitis Certain medications for blood pressure, such as enalapril, lisinopril, quinapril Cyclosporine Diuretics Etanercept Indomethacin Medications that relax muscles Metronidazole Natalizumab Tamoxifen Warfarin This list may not describe all possible interactions. Give your health care provider a list of all the medicines, herbs, non-prescription drugs, or dietary supplements you use. Also tell them if you smoke, drink alcohol, or use illegal drugs. Some items may interact with your medicine. What should I watch for while using  this medication? This medication may make you feel generally unwell. This is not uncommon as chemotherapy can affect healthy cells as well as cancer cells. Report any side effects. Continue your course of treatment even though you feel ill unless your care team tells you to stop. You may need blood work while you are taking this medication. This medication may increase your risk of getting an infection. Call your care team for advice if you get a fever, chills, sore throat, or other symptoms of a cold or flu. Do not treat yourself. Try to avoid being around people who are sick. Avoid taking medications that contain aspirin, acetaminophen, ibuprofen, naproxen, or ketoprofen unless instructed by your care team. These medications may hide a fever. Be careful brushing  or flossing your teeth or using a toothpick because you may get an infection or bleed more easily. If you have any dental work done, tell your dentist you are receiving this medication. Drink water or other fluids as directed. Urinate often, even at night. Some products may contain alcohol. Ask your care team if this medication contains alcohol. Be sure to tell all care teams you are taking this medicine. Certain medicines, like metronidazole and disulfiram, can cause an unpleasant reaction when taken with alcohol. The reaction includes flushing, headache, nausea, vomiting, sweating, and increased thirst. The reaction can last from 30 minutes to several hours. Talk to your care team if you wish to become pregnant or think you might be pregnant. This medication can cause serious birth defects if taken during pregnancy and for 1 year after the last dose. A negative pregnancy test is required before starting this medication. A reliable form of contraception is recommended while taking this medication and for 1 year after the last dose. Talk to your care team about reliable forms of contraception. Do not father a child while taking this medication and for 4 months after the last dose. Use a condom during this time period. Do not breast-feed while taking this medication or for 1 week after the last dose. This medication may cause infertility. Talk to your care team if you are concerned about your fertility. Talk to your care team about your risk of cancer. You may be more at risk for certain types of cancer if you take this medication. What side effects may I notice from receiving this medication? Side effects that you should report to your care team as soon as possible: Allergic reactions--skin rash, itching, hives, swelling of the face, lips, tongue, or throat Dry cough, shortness of breath or trouble breathing Heart failure--shortness of breath, swelling of the ankles, feet, or hands, sudden weight gain,  unusual weakness or fatigue Heart muscle inflammation--unusual weakness or fatigue, shortness of breath, chest pain, fast or irregular heartbeat, dizziness, swelling of the ankles, feet, or hands Heart rhythm changes--fast or irregular heartbeat, dizziness, feeling faint or lightheaded, chest pain, trouble breathing Infection--fever, chills, cough, sore throat, wounds that don't heal, pain or trouble when passing urine, general feeling of discomfort or being unwell Kidney injury--decrease in the amount of urine, swelling of the ankles, hands, or feet Liver injury--right upper belly pain, loss of appetite, nausea, light-colored stool, dark yellow or brown urine, yellowing skin or eyes, unusual weakness or fatigue Low red blood cell level--unusual weakness or fatigue, dizziness, headache, trouble breathing Low sodium level--muscle weakness, fatigue, dizziness, headache, confusion Red or dark brown urine Unusual bruising or bleeding Side effects that usually do not require medical attention (report to your care team if  they continue or are bothersome): Hair loss Irregular menstrual cycles or spotting Loss of appetite Nausea Pain, redness, or swelling with sores inside the mouth or throat Vomiting This list may not describe all possible side effects. Call your doctor for medical advice about side effects. You may report side effects to FDA at 1-800-FDA-1088. Where should I keep my medication? This medication is given in a hospital or clinic. It will not be stored at home. NOTE: This sheet is a summary. It may not cover all possible information. If you have questions about this medicine, talk to your doctor, pharmacist, or health care provider.  2023 Elsevier/Gold Standard (2021-10-04 00:00:00)   2023 Elsevier/Gold Standard (2021-12-21 00:00:00)

## 2022-07-27 NOTE — Assessment & Plan Note (Addendum)
Breast MRI performed because of high breast density 05/10/2022: Right breast linear enhancement 1.9 cm at 12 o'clock position, indeterminate mass 8 mm in the lateral periareolar region right breast. 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   Treatment plan: 1.  Neoadjuvant with dose dense Adriamycin and Cytoxan x4 followed by Taxol and carbo x12 2. breast conserving surgery 3.  Adjuvant radiation ------------------------------------------------------------------------------------------------------------------------------------------------------- Current treatment: Cycle 3 dose dense Adriamycin and Cytoxan   Chemotoxicities:  fatigue Moderate nausea Chemotherapy-induced anemia: Hemoglobin 11.3 today. 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  We debated for a while about the pros and cons of Taxol chemotherapy.  Our plan is to obtain a breast MRI after 4 cycles of Adriamycin and Cytoxan.  If she has a really good response then she will proceed to surgery.  If not then we will try and see if she can handle a few rounds of Taxol and carboplatin.  Return to clinic in 2 weeks for cycle 4

## 2022-07-28 ENCOUNTER — Other Ambulatory Visit: Payer: Self-pay

## 2022-07-29 ENCOUNTER — Other Ambulatory Visit: Payer: Self-pay

## 2022-07-29 ENCOUNTER — Inpatient Hospital Stay: Payer: Medicare Other | Attending: Genetic Counselor

## 2022-07-29 VITALS — BP 135/69 | HR 91 | Temp 98.1°F | Resp 16

## 2022-07-29 DIAGNOSIS — C50411 Malignant neoplasm of upper-outer quadrant of right female breast: Secondary | ICD-10-CM | POA: Insufficient documentation

## 2022-07-29 DIAGNOSIS — G629 Polyneuropathy, unspecified: Secondary | ICD-10-CM | POA: Insufficient documentation

## 2022-07-29 DIAGNOSIS — Z5189 Encounter for other specified aftercare: Secondary | ICD-10-CM | POA: Insufficient documentation

## 2022-07-29 DIAGNOSIS — Z5111 Encounter for antineoplastic chemotherapy: Secondary | ICD-10-CM | POA: Diagnosis not present

## 2022-07-29 DIAGNOSIS — Z171 Estrogen receptor negative status [ER-]: Secondary | ICD-10-CM | POA: Insufficient documentation

## 2022-07-29 DIAGNOSIS — Z79899 Other long term (current) drug therapy: Secondary | ICD-10-CM | POA: Diagnosis not present

## 2022-07-29 DIAGNOSIS — D6481 Anemia due to antineoplastic chemotherapy: Secondary | ICD-10-CM | POA: Insufficient documentation

## 2022-07-29 MED ORDER — PEGFILGRASTIM-CBQV 6 MG/0.6ML ~~LOC~~ SOSY
6.0000 mg | PREFILLED_SYRINGE | Freq: Once | SUBCUTANEOUS | Status: AC
  Administered 2022-07-29: 6 mg via SUBCUTANEOUS
  Filled 2022-07-29: qty 0.6

## 2022-08-09 MED FILL — Fosaprepitant Dimeglumine For IV Infusion 150 MG (Base Eq): INTRAVENOUS | Qty: 5 | Status: AC

## 2022-08-09 MED FILL — Dexamethasone Sodium Phosphate Inj 100 MG/10ML: INTRAMUSCULAR | Qty: 1 | Status: AC

## 2022-08-09 NOTE — Progress Notes (Signed)
Patient Care Team: Nicola Girt, DO as PCP - General (Internal Medicine)  DIAGNOSIS: No diagnosis found.  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 Report date is 06/28/2022.   The Common Hereditary Cancers + RNA Panel offered by [facility] includes sequencing, deletion/duplication, and RNA testing of the following 47 genes: APC, ATM, AXIN2, BARD1, BMPR1A, BRCA1, BRCA2, BRIP1, CDH1, CDK4*, CDKN2A (p14ARF)*, CDKN2A (p16INK4a)*, CHEK2, CTNNA1, DICER1, EPCAM (Deletion/duplication testing only), GREM1 (promoter region deletion/duplication testing only), KIT, MEN1, MLH1, MSH2, MSH3, MSH6, MUTYH, NBN, NF1, NHTL1, PALB2, PDGFRA*, PMS2, POLD1, POLE, PTEN, RAD50, RAD51C, RAD51D, SDHB, SDHC, SDHD, SMAD4, SMARCA4. STK11, TP53, TSC1, TSC2, and VHL.  The following genes were evaluated for sequence changes only: SDHA and HOXB13 c.251G>A variant only.  RNA analysis is not performed for the * genes.       CHIEF COMPLIANT: right breast cancer on cycle 3 AC    INTERVAL HISTORY:  Karina Ferguson is a 66 y.o. female is here because of recent diagnosis of right breast cancer currently on [facility]. She presents to the clinic today for a follow-up.       ALLERGIES:  is allergic to amoxicillin, esomeprazole magnesium, and rabeprazole.  MEDICATIONS:  Current Outpatient Medications  Medication Sig Dispense Refill   albuterol (VENTOLIN HFA) 108 (90 Base) MCG/ACT inhaler Inhale 1-2 puffs into the lungs every 4 (four) hours as needed for wheezing or shortness of breath. PRN 18 g 2   amLODipine (NORVASC) 10 MG tablet Take 1 tablet by mouth daily.     azelastine (ASTELIN) 0.1 % nasal spray Place 1 spray into both nostrils 2 (two) times daily. Use in each nostril as directed 30 mL 2   cyanocobalamin (VITAMIN B12) 1000 MCG tablet Take 1 tablet by mouth daily.     dexamethasone (DECADRON) 4 MG tablet Take 1 tablet day after chemo and 1 tablet 2 days after chemo with food 8 tablet 0   gabapentin (NEURONTIN) 100 MG capsule Take by mouth. PRN for back pain     hydrochlorothiazide (HYDRODIURIL) 25 MG tablet Take 25 mg by mouth daily.     hydrocortisone (ANUSOL-HC) 2.5 % rectal cream Bid prn hemorrhoids 30 g 1   lidocaine-prilocaine (EMLA) cream Apply to affected area once 30 g 3   LORazepam (ATIVAN) 0.5 MG tablet Take 1 tablet (0.5 mg total) by mouth every 8 (eight) hours as needed for anxiety. 30 tablet 0   metoprolol succinate (TOPROL-XL) 100 MG 24 hr tablet Take 50 mg by mouth daily.     montelukast (SINGULAIR) 10 MG tablet Take 10  mg by mouth daily.     Multiple Vitamin (MULTI-VITAMIN) tablet Take 1 tablet by mouth daily.     Omega-3 Fatty Acids (FISH OIL) 1000 MG CAPS Take by mouth.     omeprazole (PRILOSEC) 20 MG capsule Take 20 mg by mouth daily.     ondansetron (ZOFRAN) 8 MG tablet Take 1 tablet every 8 hours as needed for nausea and vomiting. Start on the third day after doxorubicin/cyclophosphamide or carboplatin. 30 tablet 1   prochlorperazine (COMPAZINE) 10 MG tablet Take 1 tablet  (10 mg total) by mouth every 6 (six) hours as needed for nausea or vomiting. 30 tablet 1   pseudoephedrine-guaifenesin (MUCINEX D) 60-600 MG 12 hr tablet Take 1 tablet by mouth every 12 (twelve) hours.     traMADol (ULTRAM) 50 MG tablet Take 1 tablet (50 mg total) by mouth every 6 (six) hours as needed. 10 tablet 0   No current facility-administered medications for this visit.    PHYSICAL EXAMINATION: ECOG PERFORMANCE STATUS: {CHL ONC ECOG PS:[phone]}  There were no vitals filed for this visit. There were no vitals filed for this visit.  BREAST:*** No palpable masses or nodules in either right or left breasts. No palpable axillary supraclavicular or infraclavicular adenopathy no breast tenderness or nipple discharge. (exam performed in the presence of a chaperone)  LABORATORY DATA:  I have reviewed the data as listed    Latest Ref Rng & Units 07/27/2022    9:31 AM 07/13/2022    9:25 AM 07/06/2022   10:29 AM  CMP  Glucose 70 - 99 mg/dL 121  131  104   BUN 8 - 23 mg/dL _0 Creatinine 0.44 - 1.00 mg/dL 0.89  0.92  0.86   Sodium 135 - 145 mmol/L 139  140  139   Potassium 3.5 - 5.1 mmol/L 3.7  3.6  3.9   Chloride 98 - 111 mmol/L 105  106  106   CO2 22 - 32 mmol/L _1 Calcium 8.9 - 10.3 mg/dL 10.5  10.0  9.6   Total Protein 6.5 - 8.1 g/dL 7.1  7.0  6.7   Total Bilirubin 0.3 - 1.2 mg/dL 0.4  0.3  0.5   Alkaline Phos 38 - 126 U/L 61  55  56   AST 15 - 41 U/L _2 ALT 0 - 44 U/L _3 Lab Results  Component Value Date   WBC 9.0 07/27/2022   HGB 11.2 (L) 07/27/2022   HCT 33.7 (L) 07/27/2022   MCV 89.6 07/27/2022   PLT 138 (L) 07/27/2022   NEUTROABS 6.3 07/27/2022    ASSESSMENT & PLAN:  No problem-specific Assessment & Plan notes found for this encounter.    No orders of the defined types were placed in this encounter.  The patient has a good understanding of the overall plan. she agrees with it. she will call with any problems that may  develop before the next visit here. Total time spent: 30 mins including face to face time and time spent for planning, charting and co-ordination of care   Suzzette Righter, [place] 08/09/22    I Gardiner Coins am acting as a Education administrator for [employer]  ***

## 2022-08-10 ENCOUNTER — Inpatient Hospital Stay: Payer: Medicare Other

## 2022-08-10 ENCOUNTER — Inpatient Hospital Stay (HOSPITAL_BASED_OUTPATIENT_CLINIC_OR_DEPARTMENT_OTHER): Payer: Medicare Other | Admitting: Hematology and Oncology

## 2022-08-10 ENCOUNTER — Encounter: Payer: Self-pay | Admitting: *Deleted

## 2022-08-10 VITALS — BP 137/81 | HR 96 | Resp 16

## 2022-08-10 VITALS — BP 180/88 | HR 106 | Temp 97.2°F | Resp 19 | Ht 69.0 in | Wt 229.1 lb

## 2022-08-10 DIAGNOSIS — Z171 Estrogen receptor negative status [ER-]: Secondary | ICD-10-CM | POA: Diagnosis not present

## 2022-08-10 DIAGNOSIS — C50411 Malignant neoplasm of upper-outer quadrant of right female breast: Secondary | ICD-10-CM | POA: Diagnosis not present

## 2022-08-10 DIAGNOSIS — Z95828 Presence of other vascular implants and grafts: Secondary | ICD-10-CM

## 2022-08-10 DIAGNOSIS — Z5111 Encounter for antineoplastic chemotherapy: Secondary | ICD-10-CM | POA: Diagnosis not present

## 2022-08-10 LAB — CMP (CANCER CENTER ONLY)
ALT: 16 U/L (ref 0–44)
AST: 15 U/L (ref 15–41)
Albumin: 4 g/dL (ref 3.5–5.0)
Alkaline Phosphatase: 62 U/L (ref 38–126)
Anion gap: 7 (ref 5–15)
BUN: 6 mg/dL — ABNORMAL LOW (ref 8–23)
CO2: 27 mmol/L (ref 22–32)
Calcium: 10.2 mg/dL (ref 8.9–10.3)
Chloride: 108 mmol/L (ref 98–111)
Creatinine: 0.86 mg/dL (ref 0.44–1.00)
GFR, Estimated: >60 mL/min (ref >60)
Glucose, Bld: 128 mg/dL — ABNORMAL HIGH (ref 70–99)
Potassium: 3.6 mmol/L (ref 3.5–5.1)
Sodium: 142 mmol/L (ref 135–145)
Total Bilirubin: 0.3 mg/dL (ref 0.3–1.2)
Total Protein: 6.2 g/dL — ABNORMAL LOW (ref 6.5–8.1)

## 2022-08-10 LAB — CBC WITH DIFFERENTIAL (CANCER CENTER ONLY)
Abs Immature Granulocytes: 0.93 10*3/uL — ABNORMAL HIGH (ref 0.00–0.07)
Basophils Absolute: 0.1 10*3/uL (ref 0.0–0.1)
Basophils Relative: 1 %
Eosinophils Absolute: 0 10*3/uL (ref 0.0–0.5)
Eosinophils Relative: 0 %
HCT: 30.9 % — ABNORMAL LOW (ref 36.0–46.0)
Hemoglobin: 10.6 g/dL — ABNORMAL LOW (ref 12.0–15.0)
Immature Granulocytes: 11 %
Lymphocytes Relative: 7 %
Lymphs Abs: 0.6 10*3/uL — ABNORMAL LOW (ref 0.7–4.0)
MCH: 30.6 pg (ref 26.0–34.0)
MCHC: 34.3 g/dL (ref 30.0–36.0)
MCV: 89.3 fL (ref 80.0–100.0)
Monocytes Absolute: 1.1 10*3/uL — ABNORMAL HIGH (ref 0.1–1.0)
Monocytes Relative: 14 %
Neutro Abs: 5.5 10*3/uL (ref 1.7–7.7)
Neutrophils Relative %: 67 %
Platelet Count: 222 10*3/uL (ref 150–400)
RBC: 3.46 MIL/uL — ABNORMAL LOW (ref 3.87–5.11)
RDW: 15.6 % — ABNORMAL HIGH (ref 11.5–15.5)
WBC Count: 8.3 10*3/uL (ref 4.0–10.5)
nRBC: 0.2 % (ref 0.0–0.2)

## 2022-08-10 MED ORDER — SODIUM CHLORIDE 0.9% FLUSH
10.0000 mL | INTRAVENOUS | Status: DC | PRN
  Administered 2022-08-10: 10 mL

## 2022-08-10 MED ORDER — SODIUM CHLORIDE 0.9 % IV SOLN
150.0000 mg | Freq: Once | INTRAVENOUS | Status: AC
  Administered 2022-08-10: 150 mg via INTRAVENOUS
  Filled 2022-08-10: qty 150

## 2022-08-10 MED ORDER — SODIUM CHLORIDE 0.9 % IV SOLN
600.0000 mg/m2 | Freq: Once | INTRAVENOUS | Status: AC
  Administered 2022-08-10: 1360 mg via INTRAVENOUS
  Filled 2022-08-10: qty 68

## 2022-08-10 MED ORDER — SODIUM CHLORIDE 0.9 % IV SOLN
10.0000 mg | Freq: Once | INTRAVENOUS | Status: AC
  Administered 2022-08-10: 10 mg via INTRAVENOUS
  Filled 2022-08-10: qty 10

## 2022-08-10 MED ORDER — SODIUM CHLORIDE 0.9% FLUSH
10.0000 mL | Freq: Once | INTRAVENOUS | Status: AC
  Administered 2022-08-10: 10 mL

## 2022-08-10 MED ORDER — SODIUM CHLORIDE 0.9 % IV SOLN: Freq: Once | INTRAVENOUS | Status: AC

## 2022-08-10 MED ORDER — HEPARIN SOD (PORK) LOCK FLUSH 100 UNIT/ML IV SOLN
500.0000 [IU] | Freq: Once | INTRAVENOUS | Status: AC | PRN
  Administered 2022-08-10: 500 [IU]

## 2022-08-10 MED ORDER — DOXORUBICIN HCL CHEMO IV INJECTION 2 MG/ML
60.0000 mg/m2 | Freq: Once | INTRAVENOUS | Status: AC
  Administered 2022-08-10: 136 mg via INTRAVENOUS
  Filled 2022-08-10: qty 68

## 2022-08-10 MED ORDER — PALONOSETRON HCL INJECTION 0.25 MG/5ML
0.2500 mg | Freq: Once | INTRAVENOUS | Status: AC
  Administered 2022-08-10: 0.25 mg via INTRAVENOUS
  Filled 2022-08-10: qty 5

## 2022-08-10 NOTE — Patient Instructions (Signed)
[facility]  Discharge Instructions: Thank you for choosing [facility] to provide your oncology and hematology care.   If you have a lab appointment with the [facility], please go directly to the [facility] and check in at the registration area.   Wear comfortable clothing and clothing appropriate for easy access to any Portacath or PICC line.   We strive to give you quality time with your provider. You may need to reschedule your appointment if you arrive late (15 or more minutes).  Arriving late affects you and other patients whose appointments are after yours.  Also, if you miss three or more appointments without notifying the office, you may be dismissed from the clinic at the provider's discretion.      For prescription refill requests, have your pharmacy contact our office and allow 72 hours for refills to be completed.    Today you received the following chemotherapy and/or immunotherapy agents: Adriamycin/Cytoxan      To help prevent nausea and vomiting after your treatment, we encourage you to take your nausea medication as directed.  BELOW ARE SYMPTOMS THAT SHOULD BE REPORTED IMMEDIATELY: *FEVER GREATER THAN 100.4 F (38 C) OR HIGHER *CHILLS OR SWEATING *NAUSEA AND VOMITING THAT IS NOT CONTROLLED WITH YOUR NAUSEA MEDICATION *UNUSUAL SHORTNESS OF BREATH *UNUSUAL BRUISING OR BLEEDING *URINARY PROBLEMS (pain or burning when urinating, or frequent urination) *BOWEL PROBLEMS (unusual diarrhea, constipation, pain near the anus) TENDERNESS IN MOUTH AND THROAT WITH OR WITHOUT PRESENCE OF ULCERS (sore throat, sores in mouth, or a toothache) UNUSUAL RASH, SWELLING OR PAIN  UNUSUAL VAGINAL DISCHARGE OR ITCHING   Items with * indicate a potential emergency and should be followed up as soon as possible or go to the Emergency Department if any problems should occur.  Please show the CHEMOTHERAPY ALERT CARD or IMMUNOTHERAPY ALERT CARD at  check-in to the Emergency Department and triage nurse.  Should you have questions after your visit or need to cancel or reschedule your appointment, please contact [facility]  Dept: [phone]  and follow the prompts.  Office hours are 8:00 a.m. to 4:30 p.m. Monday - Friday. Please note that voicemails left after 4:00 p.m. may not be returned until the following business day.  We are closed weekends and major holidays. You have access to a nurse at all times for urgent questions. Please call the main number to the clinic Dept: [phone] and follow the prompts.   For any non-urgent questions, you may also contact your provider using MyChart. We now offer e-Visits for anyone 34 and older to request care online for non-urgent symptoms. For details visit [email or web address].   Also download the MyChart app! Go to the app store, search "MyChart", open the app, select [facility], and log in with your MyChart username and password.  Masks are optional in the cancer centers. If you would like for your care team to wear a mask while they are taking care of you, please let them know. You may have one support person who is at least 68 years old accompany you for your appointments.

## 2022-08-10 NOTE — Assessment & Plan Note (Addendum)
Breast MRI performed because of high breast density 05/10/2022: Right breast linear enhancement 1.9 cm at 12 o'clock position, indeterminate mass 8 mm in the lateral periareolar region right breast. 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   Treatment plan: 1.  Neoadjuvant with dose dense Adriamycin and Cytoxan x4 followed by Taxol and carbo x12 2. breast conserving surgery 3.  Adjuvant radiation ------------------------------------------------------------------------------------------------------------------------------------------------------- Current treatment: Cycle 4 dose dense Adriamycin and Cytoxan   Chemotoxicities:  fatigue Moderate nausea Chemotherapy-induced anemia: Hemoglobin 11.3 today. 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  We debated for a while about the pros and cons of Taxol chemotherapy.   Our plan is to obtain a breast MRI after this cycle of Adriamycin and Cytoxan.  If she has a really good response then she will proceed to surgery.  If not then we will try and see if she can handle a few rounds of Taxol and carboplatin.  Her neuropathy is quite significant and therefore we are hesitant to use taxanes.   Return to clinic after the MRI to discuss results

## 2022-08-11 ENCOUNTER — Other Ambulatory Visit: Payer: Self-pay

## 2022-08-12 ENCOUNTER — Inpatient Hospital Stay: Payer: Medicare Other

## 2022-08-12 VITALS — BP 147/77 | HR 97 | Temp 98.1°F | Resp 19 | Ht 69.0 in

## 2022-08-12 DIAGNOSIS — Z5111 Encounter for antineoplastic chemotherapy: Secondary | ICD-10-CM | POA: Diagnosis not present

## 2022-08-12 DIAGNOSIS — C50411 Malignant neoplasm of upper-outer quadrant of right female breast: Secondary | ICD-10-CM

## 2022-08-12 MED ORDER — PEGFILGRASTIM-CBQV 6 MG/0.6ML ~~LOC~~ SOSY
6.0000 mg | PREFILLED_SYRINGE | Freq: Once | SUBCUTANEOUS | Status: AC
  Administered 2022-08-12: 6 mg via SUBCUTANEOUS

## 2022-08-13 ENCOUNTER — Other Ambulatory Visit: Payer: Self-pay

## 2022-08-15 ENCOUNTER — Other Ambulatory Visit: Payer: Self-pay

## 2022-08-16 ENCOUNTER — Ambulatory Visit
Admission: RE | Admit: 2022-08-16 | Discharge: 2022-08-16 | Disposition: A | Discharge status: Home / Self Care | Payer: Medicare Other | Source: Ambulatory Visit | Attending: Hematology and Oncology | Admitting: Hematology and Oncology

## 2022-08-16 DIAGNOSIS — Z171 Estrogen receptor negative status [ER-]: Secondary | ICD-10-CM

## 2022-08-16 MED ORDER — GADOPICLENOL 0.5 MMOL/ML IV SOLN
10.0000 mL | Freq: Once | INTRAVENOUS | Status: AC | PRN
  Administered 2022-08-16: 10 mL via INTRAVENOUS

## 2022-08-17 NOTE — Progress Notes (Signed)
Patient Care Team: Nicola Girt, DO as PCP - General (Internal Medicine)  DIAGNOSIS:  Encounter Diagnosis  Name Primary?   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 Report date is 06/28/2022.   The Common Hereditary Cancers + RNA Panel offered by [facility] includes sequencing, deletion/duplication, and RNA testing of the following 47 genes: APC, ATM, AXIN2, BARD1, BMPR1A, BRCA1, BRCA2, BRIP1, CDH1, CDK4*, CDKN2A (p14ARF)*, CDKN2A (p16INK4a)*, CHEK2, CTNNA1, DICER1, EPCAM (Deletion/duplication testing only), GREM1 (promoter region deletion/duplication testing only), KIT, MEN1, MLH1, MSH2, MSH3, MSH6, MUTYH, NBN, NF1, NHTL1, PALB2, PDGFRA*, PMS2, POLD1, POLE, PTEN, RAD50, RAD51C, RAD51D, SDHB, SDHC, SDHD, SMAD4, SMARCA4. STK11, TP53, TSC1, TSC2, and VHL.  The following genes were evaluated for sequence changes only: SDHA and HOXB13 c.251G>A variant only.  RNA  analysis is not performed for the * genes.       CHIEF COMPLIANT: Follow-up to discuss results of recent breast MRI  INTERVAL HISTORY: Karina Ferguson is a 66 year old with above-mentioned history of breast cancer who underwent 4 cycles of neoadjuvant chemotherapy with dose dense Adriamycin and Cytoxan.  She is here today to discuss the results of the breast MRI done after the 4 cycles.  We decided not to do Taxol because of neuropathy.   ALLERGIES:  is allergic to amoxicillin, esomeprazole magnesium, and rabeprazole.  MEDICATIONS:  Current Outpatient Medications  Medication Sig Dispense Refill   albuterol (VENTOLIN HFA) 108 (90 Base) MCG/ACT inhaler Inhale 1-2 puffs into the lungs every 4 (four) hours as needed for wheezing or shortness of breath. PRN 18 g 2   amLODipine (NORVASC) 10 MG tablet Take 1 tablet by mouth daily.     azelastine (ASTELIN) 0.1 % nasal spray Place 1 spray into both nostrils 2 (two) times daily. Use in each nostril as directed 30 mL 2   cyanocobalamin (VITAMIN B12) 1000 MCG tablet Take 1 tablet by mouth daily.     dexamethasone (DECADRON) 4 MG tablet Take 1 tablet day after chemo and 1 tablet 2 days after chemo with food 8 tablet 0   gabapentin (NEURONTIN) 100 MG capsule Take by mouth. PRN for back pain     hydrochlorothiazide (HYDRODIURIL) 25 MG tablet Take 25 mg by mouth daily.     hydrocortisone (ANUSOL-HC) 2.5 % rectal cream Bid prn hemorrhoids 30 g 1   lidocaine-prilocaine (EMLA) cream Apply to affected area once 30 g 3   LORazepam (ATIVAN) 0.5 MG tablet Take 1 tablet (0.5 mg total)  by mouth every 8 (eight) hours as needed for anxiety. 30 tablet 0   metoprolol succinate (TOPROL-XL) 100 MG 24 hr tablet Take 50 mg by mouth daily.     montelukast (SINGULAIR) 10 MG tablet Take 10 mg by mouth daily.     Multiple Vitamin (MULTI-VITAMIN) tablet Take 1 tablet by mouth daily.     Omega-3 Fatty Acids (FISH OIL) 1000 MG CAPS Take by mouth.     omeprazole (PRILOSEC) 20 MG  capsule Take 20 mg by mouth daily.     ondansetron (ZOFRAN) 8 MG tablet Take 1 tablet every 8 hours as needed for nausea and vomiting. Start on the third day after doxorubicin/cyclophosphamide or carboplatin. 30 tablet 1   prochlorperazine (COMPAZINE) 10 MG tablet Take 1 tablet (10 mg total) by mouth every 6 (six) hours as needed for nausea or vomiting. 30 tablet 1   pseudoephedrine-guaifenesin (MUCINEX D) 60-600 MG 12 hr tablet Take 1 tablet by mouth every 12 (twelve) hours.     traMADol (ULTRAM) 50 MG tablet Take 1 tablet (50 mg total) by mouth every 6 (six) hours as needed. 10 tablet 0   No current facility-administered medications for this visit.    PHYSICAL EXAMINATION: ECOG PERFORMANCE STATUS: 1 - Symptomatic but completely ambulatory  Vitals:   08/18/22 1213  BP: (!) 149/82  Pulse: 99  Resp: 18  Temp: 98 F (36.7 C)  SpO2: 100%   Filed Weights   08/18/22 1213  Weight: 227 lb 9.6 oz (103.2 kg)      LABORATORY DATA:  I have reviewed the data as listed    Latest Ref Rng & Units 08/10/2022    9:56 AM 07/27/2022    9:31 AM 07/13/2022    9:25 AM  CMP  Glucose 70 - 99 mg/dL 128  121  131   BUN 8 - 23 mg/dL _0 Creatinine 0.44 - 1.00 mg/dL 0.86  0.89  0.92   Sodium 135 - 145 mmol/L 142  139  140   Potassium 3.5 - 5.1 mmol/L 3.6  3.7  3.6   Chloride 98 - 111 mmol/L 108  105  106   CO2 22 - 32 mmol/L _1 Calcium 8.9 - 10.3 mg/dL 10.2  10.5  10.0   Total Protein 6.5 - 8.1 g/dL 6.2  7.1  7.0   Total Bilirubin 0.3 - 1.2 mg/dL 0.3  0.4  0.3   Alkaline Phos 38 - 126 U/L 62  61  55   AST 15 - 41 U/L _2 ALT 0 - 44 U/L _3 Lab Results  Component Value Date   WBC 8.3 08/10/2022   HGB 10.6 (L) 08/10/2022   HCT 30.9 (L) 08/10/2022   MCV 89.3 08/10/2022   PLT 222 08/10/2022   NEUTROABS 5.5 08/10/2022    ASSESSMENT & PLAN:  Malignant neoplasm of upper-outer quadrant of right breast in female, estrogen receptor negative ([place]) Breast  MRI performed because of high breast density 05/10/2022: Right breast linear enhancement 1.9 cm at 12 o'clock position, indeterminate mass 8 mm in the lateral periareolar region right breast. 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   Treatment plan: 1.  Neoadjuvant with dose dense Adriamycin  and Cytoxan x4 2. breast conserving surgery 3.  Adjuvant radiation ------------------------------------------------------------------------------------------------------------------------------------------------------- Current treatment: Completed 4 cycles of dose dense Adriamycin and Cytoxan (not continuing with Taxol because of pre-existing severe peripheral neuropathy)   Chemotoxicities:  fatigue Moderate nausea Chemotherapy-induced anemia: Hemoglobin 11.3 today. Difficulty with sleeping: Patient will take Benadryl at bedtime Monitoring closely for toxicities   Breast MRI 08/17/2022: Interval decrease in the size of the biopsy-proven malignancy now measuring 0.7 cm stable to slight decrease in the retroareolar mass 0.7 cm.  Based on risks and benefits of Taxol we decided not to continue with chemotherapy but to refer the patient back to surgery to proceed with breast conserving surgery.  Return to clinic after surgery to discuss the final pathology report.    No orders of the defined types were placed in this encounter.  The patient has a good understanding of the overall plan. she agrees with it. she will call with any problems that may develop before the next visit here. Total time spent: 30 mins including face to face time and time spent for planning, charting and co-ordination of care   Harriette Ohara, MD 08/18/22    I Gardiner Coins am acting as a Education administrator for [employer]  I have reviewed the above documentation  for accuracy and completeness, and I agree with the above.

## 2022-08-18 ENCOUNTER — Inpatient Hospital Stay (HOSPITAL_BASED_OUTPATIENT_CLINIC_OR_DEPARTMENT_OTHER): Payer: Medicare Other | Admitting: Hematology and Oncology

## 2022-08-18 ENCOUNTER — Other Ambulatory Visit: Payer: Self-pay

## 2022-08-18 VITALS — BP 149/82 | HR 99 | Temp 98.0°F | Resp 18 | Ht 69.0 in | Wt 227.6 lb

## 2022-08-18 DIAGNOSIS — Z171 Estrogen receptor negative status [ER-]: Secondary | ICD-10-CM | POA: Diagnosis not present

## 2022-08-18 DIAGNOSIS — C50411 Malignant neoplasm of upper-outer quadrant of right female breast: Secondary | ICD-10-CM

## 2022-08-18 DIAGNOSIS — Z5111 Encounter for antineoplastic chemotherapy: Secondary | ICD-10-CM | POA: Diagnosis not present

## 2022-08-18 NOTE — Assessment & Plan Note (Signed)
Breast MRI performed because of high breast density 05/10/2022: Right breast linear enhancement 1.9 cm at 12 o'clock position, indeterminate mass 8 mm in the lateral periareolar region right breast. 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   Treatment plan: 1.  Neoadjuvant with dose dense Adriamycin and Cytoxan x4 2. breast conserving surgery 3.  Adjuvant radiation ------------------------------------------------------------------------------------------------------------------------------------------------------- Current treatment: Completed 4 cycles of dose dense Adriamycin and Cytoxan (not continuing with Taxol because of pre-existing severe peripheral neuropathy)   Chemotoxicities:  fatigue Moderate nausea Chemotherapy-induced anemia: Hemoglobin 11.3 today. Difficulty with sleeping: Patient will take Benadryl at bedtime Monitoring closely for toxicities   Breast MRI 08/17/2022: Interval decrease in the size of the biopsy-proven malignancy now measuring 0.7 cm stable to slight decrease in the retroareolar mass 0.7 cm.  Based on risks and benefits of Taxol we decided not to continue with chemotherapy but to refer the patient back to surgery to proceed with breast conserving surgery.  Return to clinic after surgery to discuss the final pathology report.

## 2022-08-23 ENCOUNTER — Encounter: Payer: Self-pay | Admitting: *Deleted

## 2022-08-23 MED FILL — Fosaprepitant Dimeglumine For IV Infusion 150 MG (Base Eq): INTRAVENOUS | Qty: 5 | Status: AC

## 2022-08-23 MED FILL — Dexamethasone Sodium Phosphate Inj 100 MG/10ML: INTRAMUSCULAR | Qty: 1 | Status: AC

## 2022-08-24 ENCOUNTER — Ambulatory Visit: Payer: Medicare Other

## 2022-08-24 ENCOUNTER — Ambulatory Visit: Payer: Medicare Other | Admitting: Adult Health

## 2022-08-24 ENCOUNTER — Other Ambulatory Visit: Payer: Medicare Other

## 2022-08-29 ENCOUNTER — Other Ambulatory Visit: Payer: Self-pay

## 2022-08-30 ENCOUNTER — Encounter: Payer: Self-pay | Admitting: *Deleted

## 2022-09-01 ENCOUNTER — Ambulatory Visit: Payer: Medicare Other

## 2022-09-01 ENCOUNTER — Other Ambulatory Visit: Payer: Self-pay | Admitting: General Surgery

## 2022-09-01 ENCOUNTER — Other Ambulatory Visit: Payer: Medicare Other

## 2022-09-01 ENCOUNTER — Ambulatory Visit: Payer: Medicare Other | Admitting: Adult Health

## 2022-09-01 DIAGNOSIS — Z171 Estrogen receptor negative status [ER-]: Secondary | ICD-10-CM

## 2022-09-03 ENCOUNTER — Other Ambulatory Visit: Payer: Self-pay

## 2022-09-04 ENCOUNTER — Encounter: Payer: Self-pay | Admitting: *Deleted

## 2022-09-04 DIAGNOSIS — Z171 Estrogen receptor negative status [ER-]: Secondary | ICD-10-CM

## 2022-09-05 ENCOUNTER — Other Ambulatory Visit: Payer: Self-pay | Admitting: General Surgery

## 2022-09-05 ENCOUNTER — Other Ambulatory Visit: Payer: Self-pay

## 2022-09-05 DIAGNOSIS — Z171 Estrogen receptor negative status [ER-]: Secondary | ICD-10-CM

## 2022-09-06 NOTE — Progress Notes (Signed)
New Breast Cancer Diagnosis: Right Breast UOQ  Did patient present with symptoms (if so, please note symptoms) or screening mammography?:Screening Mass    Location and Extent of disease :right breast. Located at 12 o'clock position, measured 1.9 cm in greatest dimension. Adenopathy no.  Histology per Pathology Report: grade 3, Invasive Ductal Carcinoma with High Grade DCIS 06/05/2022  Receptor Status: ER(negative), PR (negative), Her2-neu (negative), Ki-(60%)   Surgeon and surgical plan, if any:  Dr. Donne Hazel -Right Breast Lumpectomy with radioactive seed and SLN biopsy 09/14/2022  Medical oncologist, treatment if any:   Dr. Lindi Adie 08/18/2022 Treatment plan: 1.  Neoadjuvant with dose dense Adriamycin and Cytoxan x4 2. breast conserving surgery 3.  Adjuvant radiation ------------------------------------------------------------------------------------------------------------------------------------------------------- Current treatment: Completed 4 cycles of dose dense Adriamycin and Cytoxan (not continuing with Taxol because of pre-existing severe peripheral neuropathy)    Family History of Breast/Ovarian/Prostate Cancer: Paternal Aunt had breast cancer.  Lymphedema issues, if any:  No    Pain issues, if any: No  SAFETY ISSUES: Prior radiation? Left Breast XRT Pacemaker/ICD? No Possible current pregnancy? Hysterectomy Is the patient on methotrexate? No  Current Complaints / other details:   -History of Left Breast DCIS 2003- [place]  -Genetics- Negative

## 2022-09-07 ENCOUNTER — Ambulatory Visit
Admission: RE | Admit: 2022-09-07 | Discharge: 2022-09-07 | Disposition: A | Discharge status: Home / Self Care | Payer: Medicare Other | Source: Ambulatory Visit | Attending: Radiation Oncology | Admitting: Radiation Oncology

## 2022-09-07 ENCOUNTER — Encounter: Payer: Self-pay | Admitting: Radiation Oncology

## 2022-09-07 VITALS — Ht 69.0 in

## 2022-09-07 DIAGNOSIS — Z171 Estrogen receptor negative status [ER-]: Secondary | ICD-10-CM

## 2022-09-07 NOTE — Pre-Procedure Instructions (Signed)
Surgical Instructions    Your procedure is scheduled on Thursday, January 18th.  Report to [facility] Main Entrance "A" at 13:15 P.M., then check in with the Admitting office.  Call this number if you have problems the morning of surgery:  [phone]  If you have any questions prior to your surgery date call [phone]: Open Monday-Friday 8am-4pm If you experience any cold or flu symptoms such as cough, fever, chills, shortness of breath, etc. between now and your scheduled surgery, please notify us at the above number.     Remember:  Do not eat after midnight the night before your surgery  You may drink clear liquids until 12:15 PM the day of your surgery.   Clear liquids allowed are: Water, Non-Citrus Juices (without pulp), Carbonated Beverages, Clear Tea, Black Coffee Only (NO MILK, CREAM OR POWDERED CREAMER of any kind), and Gatorade.   Patient Instructions  The night before surgery:  No food after midnight. ONLY clear liquids after midnight  The day of surgery (if you do NOT have diabetes):  Drink ONE (1) Pre-Surgery Clear Ensure by 12:15 PM the day of surgery. Drink in one sitting. Do not sip.  This drink was given to you during your hospital  pre-op appointment visit.  Nothing else to drink after completing the  Pre-Surgery Clear Ensure.          If you have questions, please contact your surgeon's office.     Take these medicines the morning of surgery with A SIP OF WATER  amLODipine (NORVASC)  metoprolol succinate (TOPROL-XL)  montelukast (SINGULAIR)  omeprazole (PRILOSEC)    If needed: albuterol (VENTOLIN HFA)- if needed, bring with you on day of surgery  gabapentin (NEURONTIN)  prochlorperazine (COMPAZINE)    As of today, STOP taking any Aspirin (unless otherwise instructed by your surgeon) Aleve, Naproxen, Ibuprofen, Motrin, Advil, Goody's, BC's, all herbal medications, fish oil, and all vitamins.                     Do NOT Smoke (Tobacco/Vaping) for  24 hours prior to your procedure.  If you use a CPAP at night, you may bring your mask/headgear for your overnight stay.   Contacts, glasses, piercing's, hearing aid's, dentures or partials may not be worn into surgery, please bring cases for these belongings.    For patients admitted to the hospital, discharge time will be determined by your treatment team.   Patients discharged the day of surgery will not be allowed to drive home, and someone needs to stay with them for 24 hours.  SURGICAL WAITING ROOM VISITATION Patients having surgery or a procedure may have no more than 2 support people in the waiting area - these visitors may rotate.   Children under the age of 50 must have an adult with them who is not the patient. If the patient needs to stay at the hospital during part of their recovery, the visitor guidelines for inpatient rooms apply. Pre-op nurse will coordinate an appropriate time for 1 support person to accompany patient in pre-op.  This support person may not rotate.   Please refer to the [facility] website for the visitor guidelines for Inpatients (after your surgery is over and you are in a regular room).    Special instructions:   - Preparing For Surgery  Before surgery, you can play an important role. Because skin is not sterile, your skin needs to be as free of germs as possible. You can reduce the  number of germs on your skin by washing with CHG (chlorahexidine gluconate) Soap before surgery.  CHG is an antiseptic cleaner which kills germs and bonds with the skin to continue killing germs even after washing.    Oral Hygiene is also important to reduce your risk of infection.  Remember - BRUSH YOUR TEETH THE MORNING OF SURGERY WITH YOUR REGULAR TOOTHPASTE  Please do not use if you have an allergy to CHG or antibacterial soaps. If your skin becomes reddened/irritated stop using the CHG.  Do not shave (including legs and underarms) for at least 48 hours prior  to first CHG shower. It is OK to shave your face.  Please follow these instructions carefully.   Shower the NIGHT BEFORE SURGERY and the MORNING OF SURGERY  If you chose to wash your hair, wash your hair first as usual with your normal shampoo.  After you shampoo, rinse your hair and body thoroughly to remove the shampoo.  Use CHG Soap as you would any other liquid soap. You can apply CHG directly to the skin and wash gently with a scrungie or a clean washcloth.   Apply the CHG Soap to your body ONLY FROM THE NECK DOWN.  Do not use on open wounds or open sores. Avoid contact with your eyes, ears, mouth and genitals (private parts). Wash Face and genitals (private parts)  with your normal soap.   Wash thoroughly, paying special attention to the area where your surgery will be performed.  Thoroughly rinse your body with warm water from the neck down.  DO NOT shower/wash with your normal soap after using and rinsing off the CHG Soap.  Pat yourself dry with a CLEAN TOWEL.  Wear CLEAN PAJAMAS to bed the night before surgery  Place CLEAN SHEETS on your bed the night before your surgery  DO NOT SLEEP WITH PETS.   Day of Surgery: Take a shower with CHG soap. Do not wear jewelry or makeup Do not wear lotions, powders, perfume, or deodorant. Do not shave 48 hours prior to surgery.   Do not bring valuables to the hospital. [facility] is not responsible for any belongings or valuables. Do not wear nail polish, gel polish, artificial nails, or any other type of covering on natural nails (fingers and toes) If you have artificial nails or gel coating that need to be removed by a nail salon, please have this removed prior to surgery. Artificial nails or gel coating may interfere with anesthesia's ability to adequately monitor your vital signs. Wear Clean/Comfortable clothing the morning of surgery Remember to brush your teeth WITH YOUR REGULAR TOOTHPASTE.   Please read over the following  fact sheets that you were given.    If you received a COVID test during your pre-op visit  it is requested that you wear a mask when out in public, stay away from anyone that may not be feeling well and notify your surgeon if you develop symptoms. If you have been in contact with anyone that has tested positive in the last 10 days please notify you surgeon.

## 2022-09-07 NOTE — Progress Notes (Addendum)
Radiation Oncology         [phone] ________________________________  Initial Outpatient Consultation - Conducted via telephone at patient request.  I spoke with the patient to conduct this consult visit via telephone. The patient was notified in advance and was offered an in person or telemedicine meeting to allow for face to face communication but instead preferred to proceed with a telephone consult.   Name: Karina Ferguson        MRN: [id number]  Date of Service: 09/07/2022 DOB: November 15, 1955  LG:[id number], Barbarann Ehlers, DO  Nicholas Lose, MD     REFERRING PHYSICIAN: Nicholas Lose, MD   DIAGNOSIS: The encounter diagnosis was Malignant neoplasm of upper-outer quadrant of right breast in female, estrogen receptor negative ([place]).   HISTORY OF PRESENT ILLNESS: Karina Ferguson is a 67 y.o. female seen in the multidisciplinary breast clinic for a new diagnosis of right breast cancer. The patient was noted to have a history of left breast treated in 2004 with left lumpectomy, Mammosite partial breast irradiation, and antiestrogen therapy.   In March 2023 she had a normal screening mammogram at [place], [facility], but had an MRI to follow-up with [place] in September 2023 which showed linear enhancement in the 12 o'clock position of the right breast and an indeterminate mass in the lateral periareolar region of the right breast.  No abnormal enhancement in the left breast measuring 1.9 cm was identified.  It was concluded that no evidence of adenopathy in the axilla was identified.  She relocated her care to the [facility] system and on 06/05/2022 underwent 2 biopsies.  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  The cancer was triple negative with a KIA 67 of 60%.  The second right breast biopsy showed fibrocystic change negative for malignancy.  Given these findings she was treated with neoadjuvant chemotherapy which she  completed on 08/12/2022.  Interval MRI of the breast on 08/16/2022 showed interval decrease in the biopsy-proven malignancy now measuring 7 mm and slight decrease in the biopsied lesion in the retroareolar right breast.  No adenopathy was appreciated.  She is planning to undergo a lumpectomy with sentinel lymph node biopsy with Dr. Donne Hazel on 09/14/2022.  She is contacted by phone to discuss adjuvant radiotherapy at the appropriate time.    PREVIOUS RADIATION THERAPY:   2004: The left breast was treated with Mammosite, partial breast irradiaiton at [place]:  Past Medical History:  Diagnosis Date   Allergic rhinitis    Allergy    Anemia    Anxiety    Arthritis    Breast cancer ([place]) 06/05/2022   Cancer ([place]) 04/2022   right breast IDC/DCIS   Chronic idiopathic constipation    GERD (gastroesophageal reflux disease)    Hyperlipidemia    Hypertension    Insomnia    Sleep apnea    wears CPAP nightly   Stage 3 chronic kidney disease ([place])    Thyroid disease    Vertigo        PAST SURGICAL HISTORY: Past Surgical History:  Procedure Laterality Date   ABDOMINAL HYSTERECTOMY     Left breast cancer     PORTACATH PLACEMENT Right 06/19/2022   Procedure: PORT PLACEMENT WITH ULTRASOUND GUIDANCE;  Surgeon: Rolm Bookbinder, MD;  Location: [facility];  Service: General;  Laterality: Right;   THYROIDECTOMY, PARTIAL  FAMILY HISTORY:  Family History  Problem Relation Age of Onset   Breast cancer Paternal Aunt    Lung cancer Paternal Uncle        he smoked   Lung cancer Paternal Uncle        he smoked   Colon cancer Neg Hx    Pancreatic cancer Neg Hx    Esophageal cancer Neg Hx    Stomach cancer Neg Hx    Rectal cancer Neg Hx      SOCIAL HISTORY:  reports that she has never smoked. She has never used smokeless tobacco. She reports current alcohol use. She reports that she does not use drugs. She is active and is  retired from working for a [employer].   ALLERGIES: Amoxicillin, Esomeprazole magnesium, and Rabeprazole   MEDICATIONS:  Current Outpatient Medications  Medication Sig Dispense Refill   albuterol (VENTOLIN HFA) 108 (90 Base) MCG/ACT inhaler Inhale 1-2 puffs into the lungs every 4 (four) hours as needed for wheezing or shortness of breath. PRN 18 g 2   amLODipine (NORVASC) 10 MG tablet Take 1 tablet by mouth daily.     azelastine (ASTELIN) 0.1 % nasal spray Place 1 spray into both nostrils 2 (two) times daily. Use in each nostril as directed 30 mL 2   cyanocobalamin (VITAMIN B12) 1000 MCG tablet Take 1 tablet by mouth daily.     dexamethasone (DECADRON) 4 MG tablet Take 1 tablet day after chemo and 1 tablet 2 days after chemo with food 8 tablet 0   gabapentin (NEURONTIN) 100 MG capsule Take by mouth. PRN for back pain     hydrochlorothiazide (HYDRODIURIL) 25 MG tablet Take 25 mg by mouth daily.     hydrocortisone (ANUSOL-HC) 2.5 % rectal cream Bid prn hemorrhoids 30 g 1   lidocaine-prilocaine (EMLA) cream Apply to affected area once 30 g 3   LORazepam (ATIVAN) 0.5 MG tablet Take 1 tablet (0.5 mg total) by mouth every 8 (eight) hours as needed for anxiety. 30 tablet 0   metoprolol succinate (TOPROL-XL) 100 MG 24 hr tablet Take 50 mg by mouth daily.     montelukast (SINGULAIR) 10 MG tablet Take 10 mg by mouth daily.     Multiple Vitamin (MULTI-VITAMIN) tablet Take 1 tablet by mouth daily.     Omega-3 Fatty Acids (FISH OIL) 1000 MG CAPS Take by mouth.     omeprazole (PRILOSEC) 20 MG capsule Take 20 mg by mouth daily.     ondansetron (ZOFRAN) 8 MG tablet Take 1 tablet every 8 hours as needed for nausea and vomiting. Start on the third day after doxorubicin/cyclophosphamide or carboplatin. 30 tablet 1   prochlorperazine (COMPAZINE) 10 MG tablet Take 1 tablet (10 mg total) by mouth every 6 (six) hours as needed for nausea or vomiting. 30 tablet 1   pseudoephedrine-guaifenesin (MUCINEX D)  60-600 MG 12 hr tablet Take 1 tablet by mouth every 12 (twelve) hours.     traMADol (ULTRAM) 50 MG tablet Take 1 tablet (50 mg total) by mouth every 6 (six) hours as needed. 10 tablet 0   No current facility-administered medications for this encounter.     REVIEW OF SYSTEMS: On review of systems, the patient reports that she is doing well since completing chemotherapy and feels like she tolerated this pretty well.  She did have a few episodes of nausea but none that led to vomiting.     PHYSICAL EXAM:  Unable to assess due to encounter type.   ECOG =  1  0 - Asymptomatic (Fully active, able to carry on all predisease activities without restriction)  1 - Symptomatic but completely ambulatory (Restricted in physically strenuous activity but ambulatory and able to carry out work of a light or sedentary nature. For example, light housework, office work)  2 - Symptomatic, <50% in bed during the day (Ambulatory and capable of all self care but unable to carry out any work activities. Up and about more than 50% of waking hours)  3 - Symptomatic, >50% in bed, but not bedbound (Capable of only limited self-care, confined to bed or chair 50% or more of waking hours)  4 - Bedbound (Completely disabled. Cannot carry on any self-care. Totally confined to bed or chair)  5 - Death   Eustace Pen MM, Creech RH, Tormey DC, et al. [phone]). "Toxicity and response criteria of the [facility]". [place] Oncol. 5 (6): 649-55    LABORATORY DATA:  Lab Results  Component Value Date   WBC 8.3 08/10/2022   HGB 10.6 (L) 08/10/2022   HCT 30.9 (L) 08/10/2022   MCV 89.3 08/10/2022   PLT 222 08/10/2022   Lab Results  Component Value Date   NA 142 08/10/2022   K 3.6 08/10/2022   CL 108 08/10/2022   CO2 27 08/10/2022   Lab Results  Component Value Date   ALT 16 08/10/2022   AST 15 08/10/2022   ALKPHOS 62 08/10/2022   BILITOT 0.3 08/10/2022      RADIOGRAPHY: MR BREAST  BILATERAL W WO CONTRAST INC CAD  Result Date: 08/17/2022 CLINICAL DATA:  Assess treatment response. Patient with history of right breast invasive cancer diagnosed in October 2023. Patient had a second benign biopsy at time in the right breast demonstrating fibrocystic change and adenomyoepithelioma. Remote history of left breast cancer. EXAM: BILATERAL BREAST MRI WITH AND WITHOUT CONTRAST TECHNIQUE: Multiplanar, multisequence MR images of both breasts were obtained prior to and following the intravenous administration of 10 ml of Vueway Three-dimensional MR images were rendered by post-processing of the original MR data on an independent workstation. The three-dimensional MR images were interpreted, and findings are reported in the following complete MRI report for this study. Three dimensional images were evaluated at the independent interpreting workstation using the DynaCAD thin client. COMPARISON:  Previous exam(s). FINDINGS: Breast composition: b. Scattered fibroglandular tissue. Background parenchymal enhancement: Mild Right breast: There is septal the artifact in the central superior right breast at the site of biopsy proven malignancy. There is associated linear non mass enhancement measuring 0.7 cm, previously measuring 1.9 cm. In the retroareolar breast there is susceptibility artifact consistent with prior benign biopsy there is an associated mass which is stable to slightly decreased in size from prior measuring 0.7 cm. There are no new suspicious findings elsewhere in the right breast. Left breast: Stable postsurgical changes in the posterior left breast. No mass or abnormal enhancement. Lymph nodes: No abnormal appearing lymph nodes. Ancillary findings:  None. IMPRESSION: 1. Interval decrease in size of the biopsy-proven malignancy in the central superior right breast now measuring up to 0.7 cm. 2. Stable to slight decrease in size of the biopsied mass in the retroareolar right breast measuring 0.7  cm. 3. No new suspicious findings in the right breast. 4. No evidence of malignancy in the left breast. RECOMMENDATION: Continue treatment plan for known right breast cancer. BI-RADS CATEGORY  6: Known biopsy-proven malignancy. Electronically Signed   By: Audie Pinto M.D.   On: 08/17/2022 10:15  IMPRESSION/PLAN: 1. 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 Dr. Lisbeth Renshaw discusses the pathology findings and reviews the nature of right  breast disease. She has done well with a good response to her systemic therapy. She is now moving forward with right lumpectomy with sentinel node biopsy. Dr. Lisbeth Renshaw recommends adjuvant external radiotherapy to the breast  to reduce risks of local recurrence. We discussed the risks, benefits, short, and long term effects of radiotherapy, as well as the curative intent, and the patient is interested in proceeding. Dr. Lisbeth Renshaw discusses the delivery and logistics of radiotherapy and anticipates a course of 4-6 1/2 weeks of radiotherapy to the right breast.  We will see her back a few weeks after surgery to discuss the simulation process and anticipate we starting radiotherapy about 4-6 weeks after surgery.  2. Remote history of ER positive left breast cancer. This will be followed in surveillance along with surveillance for #1.  3. In situ PAC. She will have her port removed by Dr. Donne Hazel at the time of her upcoming surgery.    This encounter was conducted via telephone.  The patient has provided two factor identification and has given verbal consent for this type of encounter and has been advised to only accept a meeting of this type in a secure network environment. The time spent during this encounter was 60 minutes including preparation, discussion, and coordination of the patient's care. The attendants for this meeting include Blenda Nicely, RN, Dr. Lisbeth Renshaw, Hayden Pedro  and Otilio Miu.  During the  encounter,  Blenda Nicely, RN, Dr. Lisbeth Renshaw, and Hayden Pedro were located at [facility] Radiation Oncology Department.  Karina Ferguson was located at home.    The above documentation reflects my direct findings during this shared patient visit. Please see the separate note by Dr. Lisbeth Renshaw on this date for the remainder of the patient's plan of care.    Carola Rhine, [facility]    **Disclaimer: This note was dictated with voice recognition software. Similar sounding words can inadvertently be transcribed and this note may contain transcription errors which may not have been corrected upon publication of note.**

## 2022-09-07 NOTE — Addendum Note (Signed)
Encounter addended by: Hayden Pedro, PA-C on: 09/07/2022 2:50 PM  Actions taken: Clinical Note Signed

## 2022-09-08 ENCOUNTER — Other Ambulatory Visit: Payer: Self-pay

## 2022-09-08 ENCOUNTER — Encounter (HOSPITAL_COMMUNITY)
Admission: RE | Admit: 2022-09-08 | Discharge: 2022-09-08 | Disposition: A | Discharge status: Home / Self Care | Payer: Medicare Other | Source: Ambulatory Visit | Attending: General Surgery | Admitting: General Surgery

## 2022-09-08 ENCOUNTER — Encounter (HOSPITAL_COMMUNITY): Payer: Self-pay

## 2022-09-08 VITALS — BP 159/90 | HR 100 | Temp 98.4°F | Resp 18 | Ht 69.0 in | Wt 229.0 lb

## 2022-09-08 DIAGNOSIS — Z01812 Encounter for preprocedural laboratory examination: Secondary | ICD-10-CM | POA: Insufficient documentation

## 2022-09-08 DIAGNOSIS — C50411 Malignant neoplasm of upper-outer quadrant of right female breast: Secondary | ICD-10-CM | POA: Insufficient documentation

## 2022-09-08 DIAGNOSIS — Z171 Estrogen receptor negative status [ER-]: Secondary | ICD-10-CM | POA: Diagnosis not present

## 2022-09-08 DIAGNOSIS — Z01818 Encounter for other preprocedural examination: Secondary | ICD-10-CM

## 2022-09-08 LAB — BASIC METABOLIC PANEL
Anion gap: 9 (ref 5–15)
BUN: 10 mg/dL (ref 8–23)
CO2: 25 mmol/L (ref 22–32)
Calcium: 10.1 mg/dL (ref 8.9–10.3)
Chloride: 102 mmol/L (ref 98–111)
Creatinine, Ser: 0.94 mg/dL (ref 0.44–1.00)
GFR, Estimated: >60 mL/min (ref >60)
Glucose, Bld: 98 mg/dL (ref 70–99)
Potassium: 3.8 mmol/L (ref 3.5–5.1)
Sodium: 136 mmol/L (ref 135–145)

## 2022-09-08 LAB — CBC
HCT: 35.6 % — ABNORMAL LOW (ref 36.0–46.0)
Hemoglobin: 11.7 g/dL — ABNORMAL LOW (ref 12.0–15.0)
MCH: 31 pg (ref 26.0–34.0)
MCHC: 32.9 g/dL (ref 30.0–36.0)
MCV: 94.2 fL (ref 80.0–100.0)
Platelets: 243 10*3/uL (ref 150–400)
RBC: 3.78 MIL/uL — ABNORMAL LOW (ref 3.87–5.11)
RDW: 16.6 % — ABNORMAL HIGH (ref 11.5–15.5)
WBC: 6.4 10*3/uL (ref 4.0–10.5)
nRBC: 0 % (ref 0.0–0.2)

## 2022-09-08 NOTE — Progress Notes (Signed)
PCP - Building services engineer - denies Pulmonologist: Marland Kitchen, NP and Sherrilyn Rist Oncologist: Gud[place]  PPM/ICD - denies   Chest x-ray - denies EKG - 06/19/22 Stress Test - denies ECHO - 06/26/22 Cardiac Cath - denies  Sleep Study - +OSA CPAP - wears nightly- autotitrates  Follow your surgeon's instructions on when to stop Aspirin.  If no instructions were given by your surgeon then you will need to call the office to get those instructions.     ERAS Protcol -yes PRE-SURGERY Ensure or G2- ensure ordered and given  COVID TEST- not needed   Anesthesia review: no  Patient denies shortness of breath, fever, cough and chest pain at PAT appointment   All instructions explained to the patient, with a verbal understanding of the material. Patient agrees to go over the instructions while at home for a better understanding. Patient also instructed to self quarantine after being tested for COVID-19. The opportunity to ask questions was provided.

## 2022-09-12 ENCOUNTER — Encounter: Payer: Self-pay | Admitting: *Deleted

## 2022-09-12 ENCOUNTER — Telehealth: Payer: Self-pay | Admitting: Radiation Oncology

## 2022-09-12 NOTE — Telephone Encounter (Signed)
Lvm to schedule post-surgery FUN/SIM.

## 2022-09-13 ENCOUNTER — Ambulatory Visit
Admission: RE | Admit: 2022-09-13 | Discharge: 2022-09-13 | Disposition: A | Discharge status: Home / Self Care | Payer: Medicare Other | Source: Ambulatory Visit | Attending: General Surgery | Admitting: General Surgery

## 2022-09-13 ENCOUNTER — Other Ambulatory Visit: Payer: Self-pay | Admitting: General Surgery

## 2022-09-13 ENCOUNTER — Telehealth: Payer: Self-pay | Admitting: Radiation Oncology

## 2022-09-13 DIAGNOSIS — C50411 Malignant neoplasm of upper-outer quadrant of right female breast: Secondary | ICD-10-CM

## 2022-09-13 HISTORY — PX: BREAST BIOPSY: SHX20

## 2022-09-13 NOTE — Telephone Encounter (Signed)
LVM on home and mobile numbers to schedule FUN and SIM post-surgery.

## 2022-09-14 ENCOUNTER — Other Ambulatory Visit: Payer: Self-pay

## 2022-09-14 ENCOUNTER — Ambulatory Visit (HOSPITAL_COMMUNITY): Payer: Medicare Other | Admitting: Certified Registered"

## 2022-09-14 ENCOUNTER — Encounter (HOSPITAL_COMMUNITY): Payer: Self-pay | Admitting: General Surgery

## 2022-09-14 ENCOUNTER — Ambulatory Visit (HOSPITAL_BASED_OUTPATIENT_CLINIC_OR_DEPARTMENT_OTHER): Payer: Medicare Other | Admitting: Certified Registered"

## 2022-09-14 ENCOUNTER — Encounter (HOSPITAL_COMMUNITY)
Admission: RE | Disposition: A | Discharge status: Home / Self Care | Payer: Self-pay | Source: Home / Self Care | Attending: General Surgery

## 2022-09-14 ENCOUNTER — Ambulatory Visit
Admission: RE | Admit: 2022-09-14 | Discharge: 2022-09-14 | Disposition: A | Discharge status: Home / Self Care | Payer: Medicare Other | Source: Ambulatory Visit | Attending: General Surgery | Admitting: General Surgery

## 2022-09-14 ENCOUNTER — Ambulatory Visit (HOSPITAL_COMMUNITY): Payer: Medicare Other

## 2022-09-14 ENCOUNTER — Inpatient Hospital Stay (HOSPITAL_COMMUNITY)
Admission: RE | Admit: 2022-09-14 | Discharge: 2022-09-17 | DRG: 580 | Disposition: A | Discharge status: Home / Self Care | Payer: Medicare Other | Attending: General Surgery | Admitting: General Surgery

## 2022-09-14 DIAGNOSIS — Z9889 Other specified postprocedural states: Secondary | ICD-10-CM

## 2022-09-14 DIAGNOSIS — Z880 Allergy status to penicillin: Secondary | ICD-10-CM

## 2022-09-14 DIAGNOSIS — F419 Anxiety disorder, unspecified: Secondary | ICD-10-CM | POA: Diagnosis present

## 2022-09-14 DIAGNOSIS — C50811 Malignant neoplasm of overlapping sites of right female breast: Principal | ICD-10-CM | POA: Diagnosis present

## 2022-09-14 DIAGNOSIS — Z9221 Personal history of antineoplastic chemotherapy: Secondary | ICD-10-CM

## 2022-09-14 DIAGNOSIS — C50911 Malignant neoplasm of unspecified site of right female breast: Secondary | ICD-10-CM

## 2022-09-14 DIAGNOSIS — J939 Pneumothorax, unspecified: Principal | ICD-10-CM

## 2022-09-14 DIAGNOSIS — E890 Postprocedural hypothyroidism: Secondary | ICD-10-CM | POA: Diagnosis present

## 2022-09-14 DIAGNOSIS — I10 Essential (primary) hypertension: Secondary | ICD-10-CM | POA: Diagnosis present

## 2022-09-14 DIAGNOSIS — C50411 Malignant neoplasm of upper-outer quadrant of right female breast: Secondary | ICD-10-CM

## 2022-09-14 DIAGNOSIS — Z853 Personal history of malignant neoplasm of breast: Secondary | ICD-10-CM

## 2022-09-14 DIAGNOSIS — Z888 Allergy status to other drugs, medicaments and biological substances status: Secondary | ICD-10-CM

## 2022-09-14 DIAGNOSIS — Z171 Estrogen receptor negative status [ER-]: Secondary | ICD-10-CM

## 2022-09-14 DIAGNOSIS — K219 Gastro-esophageal reflux disease without esophagitis: Secondary | ICD-10-CM | POA: Diagnosis present

## 2022-09-14 DIAGNOSIS — Z8249 Family history of ischemic heart disease and other diseases of the circulatory system: Secondary | ICD-10-CM

## 2022-09-14 DIAGNOSIS — J95811 Postprocedural pneumothorax: Secondary | ICD-10-CM | POA: Diagnosis not present

## 2022-09-14 DIAGNOSIS — M1990 Unspecified osteoarthritis, unspecified site: Secondary | ICD-10-CM | POA: Diagnosis present

## 2022-09-14 HISTORY — PX: BREAST LUMPECTOMY: SHX2

## 2022-09-14 HISTORY — PX: RADIOACTIVE SEED GUIDED EXCISIONAL BREAST BIOPSY: SHX6490

## 2022-09-14 HISTORY — PX: BREAST LUMPECTOMY WITH RADIOACTIVE SEED AND SENTINEL LYMPH NODE BIOPSY: SHX6550

## 2022-09-14 HISTORY — PX: RADIOACTIVE SEED GUIDED AXILLARY SENTINEL LYMPH NODE: SHX6735

## 2022-09-14 HISTORY — PX: PORT-A-CATH REMOVAL: SHX5289

## 2022-09-14 SURGERY — BREAST LUMPECTOMY WITH RADIOACTIVE SEED AND SENTINEL LYMPH NODE BIOPSY
Anesthesia: General | Site: Breast | Laterality: Right

## 2022-09-14 MED ORDER — MIDAZOLAM HCL 2 MG/2ML IJ SOLN
INTRAMUSCULAR | Status: AC
  Administered 2022-09-14: 2 mg via INTRAVENOUS
  Filled 2022-09-14: qty 2

## 2022-09-14 MED ORDER — PHENYLEPHRINE 80 MCG/ML (10ML) SYRINGE FOR IV PUSH (FOR BLOOD PRESSURE SUPPORT)
PREFILLED_SYRINGE | INTRAVENOUS | Status: AC
  Filled 2022-09-14: qty 10

## 2022-09-14 MED ORDER — MIDAZOLAM HCL 2 MG/2ML IJ SOLN
INTRAMUSCULAR | Status: AC
  Filled 2022-09-14: qty 2

## 2022-09-14 MED ORDER — ONDANSETRON 4 MG PO TBDP: 4.0000 mg | ORAL_TABLET | Freq: Four times a day (QID) | ORAL | Status: DC | PRN

## 2022-09-14 MED ORDER — ALBUTEROL SULFATE HFA 108 (90 BASE) MCG/ACT IN AERS
INHALATION_SPRAY | RESPIRATORY_TRACT | Status: AC
  Filled 2022-09-14: qty 6.7

## 2022-09-14 MED ORDER — ONDANSETRON HCL 4 MG/2ML IJ SOLN
INTRAMUSCULAR | Status: DC | PRN
  Administered 2022-09-14: 4 mg via INTRAVENOUS

## 2022-09-14 MED ORDER — METOPROLOL SUCCINATE ER 50 MG PO TB24
50.0000 mg | ORAL_TABLET | Freq: Every day | ORAL | Status: DC
  Administered 2022-09-15 – 2022-09-17 (×3): 50 mg via ORAL
  Filled 2022-09-14 (×3): qty 1

## 2022-09-14 MED ORDER — ONDANSETRON HCL 4 MG/2ML IJ SOLN
INTRAMUSCULAR | Status: AC
  Filled 2022-09-14: qty 2

## 2022-09-14 MED ORDER — BUPIVACAINE LIPOSOME 1.3 % IJ SUSP
INTRAMUSCULAR | Status: DC | PRN
  Administered 2022-09-14: 10 mL via PERINEURAL

## 2022-09-14 MED ORDER — CHLORHEXIDINE GLUCONATE CLOTH 2 % EX PADS: 6.0000 | MEDICATED_PAD | Freq: Once | CUTANEOUS | Status: DC

## 2022-09-14 MED ORDER — PROPOFOL 1000 MG/100ML IV EMUL
INTRAVENOUS | Status: AC
  Filled 2022-09-14: qty 100

## 2022-09-14 MED ORDER — LIDOCAINE 2% (20 MG/ML) 5 ML SYRINGE
INTRAMUSCULAR | Status: DC | PRN
  Administered 2022-09-14: 60 mg via INTRAVENOUS

## 2022-09-14 MED ORDER — DEXAMETHASONE SODIUM PHOSPHATE 10 MG/ML IJ SOLN
INTRAMUSCULAR | Status: DC | PRN
  Administered 2022-09-14: 5 mg via INTRAVENOUS

## 2022-09-14 MED ORDER — FENTANYL CITRATE PF 50 MCG/ML IJ SOSY
50.0000 ug | PREFILLED_SYRINGE | Freq: Once | INTRAMUSCULAR | Status: AC
  Administered 2022-09-14: 50 ug via INTRAVENOUS
  Filled 2022-09-14: qty 1

## 2022-09-14 MED ORDER — CIPROFLOXACIN IN D5W 400 MG/200ML IV SOLN
400.0000 mg | INTRAVENOUS | Status: AC
  Administered 2022-09-14: 400 mg via INTRAVENOUS
  Filled 2022-09-14: qty 200

## 2022-09-14 MED ORDER — ALBUTEROL SULFATE HFA 108 (90 BASE) MCG/ACT IN AERS
INHALATION_SPRAY | RESPIRATORY_TRACT | Status: DC | PRN
  Administered 2022-09-14: 2 via RESPIRATORY_TRACT

## 2022-09-14 MED ORDER — CIPROFLOXACIN IN D5W 400 MG/200ML IV SOLN: 400.0000 mg | INTRAVENOUS | Status: DC

## 2022-09-14 MED ORDER — PHENYLEPHRINE 80 MCG/ML (10ML) SYRINGE FOR IV PUSH (FOR BLOOD PRESSURE SUPPORT)
PREFILLED_SYRINGE | INTRAVENOUS | Status: DC | PRN
  Administered 2022-09-14: 320 ug via INTRAVENOUS
  Administered 2022-09-14 (×4): 240 ug via INTRAVENOUS

## 2022-09-14 MED ORDER — LACTATED RINGERS IV SOLN: INTRAVENOUS | Status: DC

## 2022-09-14 MED ORDER — BUPIVACAINE-EPINEPHRINE (PF) 0.5% -1:200000 IJ SOLN
INTRAMUSCULAR | Status: AC
  Filled 2022-09-14: qty 30

## 2022-09-14 MED ORDER — ALBUTEROL SULFATE HFA 108 (90 BASE) MCG/ACT IN AERS: 1.0000 | INHALATION_SPRAY | RESPIRATORY_TRACT | Status: DC | PRN

## 2022-09-14 MED ORDER — DEXAMETHASONE SODIUM PHOSPHATE 10 MG/ML IJ SOLN
INTRAMUSCULAR | Status: AC
  Filled 2022-09-14: qty 1

## 2022-09-14 MED ORDER — ACETAMINOPHEN 650 MG RE SUPP: 650.0000 mg | Freq: Four times a day (QID) | RECTAL | Status: DC | PRN

## 2022-09-14 MED ORDER — METHOCARBAMOL 500 MG PO TABS
500.0000 mg | ORAL_TABLET | Freq: Three times a day (TID) | ORAL | Status: DC | PRN
  Administered 2022-09-15 (×2): 500 mg via ORAL
  Filled 2022-09-14 (×2): qty 1

## 2022-09-14 MED ORDER — ACETAMINOPHEN 325 MG PO TABS
650.0000 mg | ORAL_TABLET | ORAL | Status: DC | PRN
  Administered 2022-09-15 – 2022-09-17 (×6): 650 mg via ORAL
  Filled 2022-09-14 (×6): qty 2

## 2022-09-14 MED ORDER — PROPOFOL 10 MG/ML IV BOLUS
INTRAVENOUS | Status: DC | PRN
  Administered 2022-09-14: 50 mg via INTRAVENOUS
  Administered 2022-09-14: 150 mg via INTRAVENOUS

## 2022-09-14 MED ORDER — ACETAMINOPHEN 650 MG RE SUPP: 650.0000 mg | RECTAL | Status: DC | PRN

## 2022-09-14 MED ORDER — SODIUM CHLORIDE 0.9% FLUSH
3.0000 mL | Freq: Two times a day (BID) | INTRAVENOUS | Status: DC
  Administered 2022-09-15 – 2022-09-16 (×4): 3 mL via INTRAVENOUS

## 2022-09-14 MED ORDER — HYDROMORPHONE HCL 1 MG/ML IJ SOLN: 0.2500 mg | INTRAMUSCULAR | Status: DC | PRN

## 2022-09-14 MED ORDER — SODIUM CHLORIDE 0.9 % IV SOLN: 250.0000 mL | INTRAVENOUS | Status: DC | PRN

## 2022-09-14 MED ORDER — FENTANYL CITRATE (PF) 250 MCG/5ML IJ SOLN
INTRAMUSCULAR | Status: AC
  Filled 2022-09-14: qty 5

## 2022-09-14 MED ORDER — MAGTRACE LYMPHATIC TRACER
INTRAMUSCULAR | Status: DC | PRN
  Administered 2022-09-14: 2 mL via INTRAMUSCULAR

## 2022-09-14 MED ORDER — OXYCODONE HCL 5 MG PO TABS
5.0000 mg | ORAL_TABLET | ORAL | Status: DC | PRN
  Administered 2022-09-14 – 2022-09-16 (×4): 5 mg via ORAL
  Filled 2022-09-14 (×4): qty 1

## 2022-09-14 MED ORDER — GABAPENTIN 100 MG PO CAPS: 100.0000 mg | ORAL_CAPSULE | Freq: Every day | ORAL | Status: DC | PRN

## 2022-09-14 MED ORDER — MONTELUKAST SODIUM 10 MG PO TABS
10.0000 mg | ORAL_TABLET | Freq: Every day | ORAL | Status: DC
  Administered 2022-09-15 – 2022-09-17 (×3): 10 mg via ORAL
  Filled 2022-09-14 (×3): qty 1

## 2022-09-14 MED ORDER — CHLORHEXIDINE GLUCONATE 0.12 % MT SOLN
15.0000 mL | Freq: Once | OROMUCOSAL | Status: AC
  Administered 2022-09-14: 15 mL via OROMUCOSAL

## 2022-09-14 MED ORDER — PANTOPRAZOLE SODIUM 40 MG PO TBEC
40.0000 mg | DELAYED_RELEASE_TABLET | Freq: Every day | ORAL | Status: DC
  Administered 2022-09-15 – 2022-09-17 (×3): 40 mg via ORAL
  Filled 2022-09-14 (×3): qty 1

## 2022-09-14 MED ORDER — LIDOCAINE 2% (20 MG/ML) 5 ML SYRINGE
INTRAMUSCULAR | Status: AC
  Filled 2022-09-14: qty 5

## 2022-09-14 MED ORDER — EPHEDRINE SULFATE-NACL 50-0.9 MG/10ML-% IV SOSY
PREFILLED_SYRINGE | INTRAVENOUS | Status: DC | PRN
  Administered 2022-09-14: 10 mg via INTRAVENOUS
  Administered 2022-09-14: 15 mg via INTRAVENOUS

## 2022-09-14 MED ORDER — METHOCARBAMOL 1000 MG/10ML IJ SOLN: 500.0000 mg | Freq: Three times a day (TID) | INTRAVENOUS | Status: DC | PRN

## 2022-09-14 MED ORDER — TRAMADOL HCL 50 MG PO TABS: 50.0000 mg | ORAL_TABLET | Freq: Four times a day (QID) | ORAL | 0 refills | Status: DC | PRN

## 2022-09-14 MED ORDER — MIDAZOLAM HCL 2 MG/2ML IJ SOLN
2.0000 mg | Freq: Once | INTRAMUSCULAR | Status: AC
  Filled 2022-09-14: qty 2

## 2022-09-14 MED ORDER — MIDAZOLAM HCL 2 MG/2ML IJ SOLN
INTRAMUSCULAR | Status: DC | PRN
  Administered 2022-09-14: 2 mg via INTRAVENOUS

## 2022-09-14 MED ORDER — ACETAMINOPHEN 500 MG PO TABS
1000.0000 mg | ORAL_TABLET | ORAL | Status: AC
  Administered 2022-09-14: 1000 mg via ORAL
  Filled 2022-09-14: qty 2

## 2022-09-14 MED ORDER — BUPIVACAINE-EPINEPHRINE (PF) 0.5% -1:200000 IJ SOLN
INTRAMUSCULAR | Status: DC | PRN
  Administered 2022-09-14: 20 mL via PERINEURAL

## 2022-09-14 MED ORDER — BUPIVACAINE-EPINEPHRINE 0.5% -1:200000 IJ SOLN: INTRAMUSCULAR | Status: DC | PRN

## 2022-09-14 MED ORDER — PROPOFOL 10 MG/ML IV BOLUS
INTRAVENOUS | Status: AC
  Filled 2022-09-14: qty 20

## 2022-09-14 MED ORDER — EPHEDRINE 5 MG/ML INJ
INTRAVENOUS | Status: AC
  Filled 2022-09-14: qty 5

## 2022-09-14 MED ORDER — ENSURE PRE-SURGERY PO LIQD: 296.0000 mL | Freq: Once | ORAL | Status: DC

## 2022-09-14 MED ORDER — ORAL CARE MOUTH RINSE: 15.0000 mL | Freq: Once | OROMUCOSAL | Status: AC

## 2022-09-14 MED ORDER — CHLORHEXIDINE GLUCONATE 0.12 % MT SOLN
15.0000 mL | OROMUCOSAL | Status: AC
  Filled 2022-09-14: qty 15

## 2022-09-14 MED ORDER — ACETAMINOPHEN 325 MG PO TABS: 650.0000 mg | ORAL_TABLET | Freq: Four times a day (QID) | ORAL | Status: DC | PRN

## 2022-09-14 MED ORDER — 0.9 % SODIUM CHLORIDE (POUR BTL) OPTIME
TOPICAL | Status: DC | PRN
  Administered 2022-09-14: 1000 mL

## 2022-09-14 MED ORDER — HYDROCHLOROTHIAZIDE 25 MG PO TABS
25.0000 mg | ORAL_TABLET | Freq: Every day | ORAL | Status: DC
  Administered 2022-09-15 – 2022-09-17 (×3): 25 mg via ORAL
  Filled 2022-09-14 (×3): qty 1

## 2022-09-14 MED ORDER — ONDANSETRON HCL 4 MG/2ML IJ SOLN: 4.0000 mg | Freq: Four times a day (QID) | INTRAMUSCULAR | Status: DC | PRN

## 2022-09-14 MED ORDER — AMLODIPINE BESYLATE 10 MG PO TABS
10.0000 mg | ORAL_TABLET | Freq: Every day | ORAL | Status: DC
  Administered 2022-09-15 – 2022-09-17 (×3): 10 mg via ORAL
  Filled 2022-09-14 (×3): qty 1

## 2022-09-14 MED ORDER — SODIUM CHLORIDE 0.9% FLUSH: 3.0000 mL | INTRAVENOUS | Status: DC | PRN

## 2022-09-14 MED ORDER — FENTANYL CITRATE (PF) 100 MCG/2ML IJ SOLN
INTRAMUSCULAR | Status: AC
  Filled 2022-09-14: qty 2

## 2022-09-14 MED ORDER — FENTANYL CITRATE (PF) 250 MCG/5ML IJ SOLN
INTRAMUSCULAR | Status: DC | PRN
  Administered 2022-09-14: 50 ug via INTRAVENOUS

## 2022-09-14 SURGICAL SUPPLY — 61 items
ADH SKN CLS APL DERMABOND .7 (GAUZE/BANDAGES/DRESSINGS) ×8
APL PRP STRL LF DISP 70% ISPRP (MISCELLANEOUS) ×4
APL PRP STRL LF ISPRP CHG 10.5 (MISCELLANEOUS) ×4
APPLICATOR CHLORAPREP 10.5 ORG (MISCELLANEOUS) ×5 IMPLANT
APPLIER CLIP 9.375 MED OPEN (MISCELLANEOUS)
APR CLP MED 9.3 20 MLT OPN (MISCELLANEOUS)
BAG COUNTER SPONGE SURGICOUNT (BAG) ×5 IMPLANT
BAG SPNG CNTER NS LX DISP (BAG) ×4
BINDER BREAST LRG (GAUZE/BANDAGES/DRESSINGS) IMPLANT
BINDER BREAST XLRG (GAUZE/BANDAGES/DRESSINGS) IMPLANT
BLADE SURG 15 STRL LF DISP TIS (BLADE) IMPLANT
BLADE SURG 15 STRL SS (BLADE) ×4
CANISTER SUCT 3000ML PPV (MISCELLANEOUS) ×5 IMPLANT
CHLORAPREP W/TINT 26 (MISCELLANEOUS) ×5 IMPLANT
CLIP APPLIE 9.375 MED OPEN (MISCELLANEOUS) IMPLANT
CLIP VESOCCLUDE MED 6/CT (CLIP) ×5 IMPLANT
CNTNR URN SCR LID CUP LEK RST (MISCELLANEOUS) IMPLANT
CONT SPEC 4OZ STRL OR WHT (MISCELLANEOUS) ×4
COVER PROBE W GEL 5X96 (DRAPES) ×10 IMPLANT
COVER SURGICAL LIGHT HANDLE (MISCELLANEOUS) ×5 IMPLANT
DERMABOND ADVANCED .7 DNX12 (GAUZE/BANDAGES/DRESSINGS) ×5 IMPLANT
DEVICE DUBIN SPECIMEN MAMMOGRA (MISCELLANEOUS) ×5 IMPLANT
DRAPE CHEST BREAST 15X10 FENES (DRAPES) ×5 IMPLANT
DRAPE LAPAROTOMY 100X72 PEDS (DRAPES) ×5 IMPLANT
ELECT CAUTERY BLADE 6.4 (BLADE) ×5 IMPLANT
ELECT COATED BLADE 2.86 ST (ELECTRODE) ×5 IMPLANT
ELECT REM PT RETURN 9FT ADLT (ELECTROSURGICAL) ×4
ELECTRODE REM PT RTRN 9FT ADLT (ELECTROSURGICAL) ×5 IMPLANT
GAUZE 4X4 16PLY ~~LOC~~+RFID DBL (SPONGE) ×5 IMPLANT
GLOVE BIO SURGEON STRL SZ7 (GLOVE) ×10 IMPLANT
GLOVE BIOGEL PI IND STRL 7.5 (GLOVE) ×5 IMPLANT
GOWN STRL REUS W/ TWL LRG LVL3 (GOWN DISPOSABLE) ×10 IMPLANT
GOWN STRL REUS W/TWL LRG LVL3 (GOWN DISPOSABLE) ×8
ILLUMINATOR WAVEGUIDE N/F (MISCELLANEOUS) IMPLANT
KIT BASIN OR (CUSTOM PROCEDURE TRAY) ×5 IMPLANT
KIT MARKER MARGIN INK (KITS) ×5 IMPLANT
KIT TURNOVER KIT B (KITS) ×5 IMPLANT
LIGHT WAVEGUIDE WIDE FLAT (MISCELLANEOUS) IMPLANT
NDL 18GX1X1/2 (RX/OR ONLY) (NEEDLE) IMPLANT
NDL FILTER BLUNT 18X1 1/2 (NEEDLE) IMPLANT
NDL HYPO 25GX1X1/2 BEV (NEEDLE) ×5 IMPLANT
NEEDLE 18GX1X1/2 (RX/OR ONLY) (NEEDLE) IMPLANT
NEEDLE FILTER BLUNT 18X1 1/2 (NEEDLE) IMPLANT
NEEDLE HYPO 25GX1X1/2 BEV (NEEDLE) ×4 IMPLANT
NS IRRIG 1000ML POUR BTL (IV SOLUTION) ×5 IMPLANT
PACK GENERAL/GYN (CUSTOM PROCEDURE TRAY) ×5 IMPLANT
PAD ARMBOARD 7.5X6 YLW CONV (MISCELLANEOUS) ×10 IMPLANT
PENCIL SMOKE EVACUATOR (MISCELLANEOUS) ×5 IMPLANT
SPIKE FLUID TRANSFER (MISCELLANEOUS) ×5 IMPLANT
STRIP CLOSURE SKIN 1/2X4 (GAUZE/BANDAGES/DRESSINGS) ×5 IMPLANT
SUT MNCRL AB 4-0 PS2 18 (SUTURE) ×10 IMPLANT
SUT MON AB 5-0 PS2 18 (SUTURE) IMPLANT
SUT SILK 2 0 SH (SUTURE) IMPLANT
SUT VIC AB 2-0 SH 27 (SUTURE) ×16
SUT VIC AB 2-0 SH 27XBRD (SUTURE) ×10 IMPLANT
SUT VIC AB 3-0 SH 27 (SUTURE) ×20
SUT VIC AB 3-0 SH 27X BRD (SUTURE) ×10 IMPLANT
SYR CONTROL 10ML LL (SYRINGE) ×5 IMPLANT
TOWEL GREEN STERILE (TOWEL DISPOSABLE) ×5 IMPLANT
TOWEL GREEN STERILE FF (TOWEL DISPOSABLE) ×5 IMPLANT
TRACER MAGTRACE VIAL (MISCELLANEOUS) IMPLANT

## 2022-09-14 NOTE — Anesthesia Procedure Notes (Signed)
Procedure Name: LMA Insertion Date/Time: 09/14/2022 3:35 PM  Performed by: Barrington Ellison, CRNAPre-anesthesia Checklist: Patient identified, Emergency Drugs available, Suction available and Patient being monitored Patient Re-evaluated:Patient Re-evaluated prior to induction Oxygen Delivery Method: Circle System Utilized Preoxygenation: Pre-oxygenation with 100% oxygen Induction Type: IV induction Ventilation: Mask ventilation without difficulty LMA: LMA inserted LMA Size: 4.0 Number of attempts: 1 Placement Confirmation: positive ETCO2 Tube secured with: Tape Dental Injury: Teeth and Oropharynx as per pre-operative assessment

## 2022-09-14 NOTE — Op Note (Signed)
Preoperative diagnosis: Clinical stage I right breast cancer, right breast mucocele Postoperative diagnosis: Same as above Procedure: 1.  Right breast radioactive seed guided lumpectomy 2.  Right breast radioactive seed guided excisional biopsy 3.  Injection of mag trace for sentinel lymph node identification 4.  Right deep axillary sentinel lymph node biopsy 5. Port removal Surgeon: Dr. Serita Grammes Anesthesia: General with a pectoral block Estimated blood loss: Minimal Complications: None Drains: Specimens: 1.  Right breast radioactive seed guided lumpectomy marked with paint containing seed and clip 2.  Additional superior, lateral margin marked short superior, long lateral, double deep 3.  Right breast radioactive seed guided excisional biopsy containing seed and clip marked with paint 4.  Right deep axillary sentinel lymph nodes with highest count of 3565 Sponge needle count was correct completion Disposition recovery stable condition   Indications: 67 year old female who has a history of 2004 left breast cancer. This was ductal carcinoma in situ and sounds like it was ER/PR negative. She was treated with lumpectomy and then MammoSite radiation. She has a stable mass at that site.  She had a mammogram in March that was negative. Her density was read as C. Due to the letter that she received she requested an MRI after that. When she got the MRI she had 2 separate areas that were abnormal. There is some linear enhancement in the 12:00 region as well as an indeterminate mass in the lateral periareolar region of the right breast. There are no abnormal appearing lymph nodes. She went biopsy of both of these. The 12:00 lesion ends up being an invasive ductal carcinoma with DCIS that is grade 3. There is no LVI. This is a triple negative breast cancer with a Ki-67 of 60%. The other biopsy ends up being fibrocystic change that is negative. This is concordant.she has completed chemotherapy. She  has done well. Repeat mri shows decrease in right breast cancer now 0.7 cm. Nodes still negative. We discussed lumpectomy, excisional biopsy of the adenomyoepithelioma, sn biopsy and port removal.    Procedure: After informed consent was obtained she was taken to the operating room.  She underwent a pectoral block.  She was given antibiotics.  SCDs were placed.  She was placed under general anesthesia without complication.  She was prepped and draped in the standard sterile surgical fashion.  A surgical timeout was then performed.   I reentered the old port incision. I then removed the port and the line in their entirety.  I removed the suture material.  I obtained hemostasis.  I closed this with 3-0 vicryl, 4-0 monocryl and glue.  I then did the excisional biopsy.  this was at lateral periareolar border.  I made an incision around the areola.   I used the Bovie to then excise the radioactive seed and using neoprobe guidance and some surrounding tissue.  Mammogram confirmed removal of the seed and the clip.  I then closed this with 2-0 Vicryl, 3-0 Vicryl, 4-0 Monocryl.  Glue and Steri-Strips were eventually applied.   I then did a lumpectomy.  This was very close to the skin so I made a curvilinear incision in the upper portion of the breast overlying the radioactive seed.    I then used the neoprobe to removed the seed in the surrounding tissue with an attempt to get a clear margin.  Mammogram was taken confirming removal of the seed and the clip.  I did 3D imaging and I thought it might be close to a couple  of additional margins so I removed these as well.  I then obtained hemostasis.  I placed a couple clips in the cavity.  I closed this with 2-0 Vicryl, 3-0 Vicryl, and 4 Monocryl.  Glue was placed over this.   I then made incision in the low axilla right below the hairline.  I carried this to the axillary fascia.  I used the Sentimag probe to identify the sentinel lymph nodes which had activity and  were somewhat brown.  I removed the sentinel nodes which were a bundle altogether.  When I did this there was no additional activity, palpable nodes, or any brown stained nodes.  These were passed off the table as a specimen.  I then obtained hemostasis.  I closed the axillary fascia with 2-0 Vicryl.  Skin was closed with 3-0 Vicryl for Monocryl.  Glue was placed.  She tolerated this well.  Postoperative bra was placed and she was transferred to recovery room.

## 2022-09-14 NOTE — H&P (Signed)
67 year old female who has a history of 2004 left breast cancer. This was ductal carcinoma in situ and sounds like it was ER/PR negative. She was treated with lumpectomy and then MammoSite radiation. She has a stable mass at that site. She has been followed and has done well since then. She had a mammogram in March that was negative. Her density was read as C. Due to the letter that she received she requested an MRI after that. When she got the MRI she had 2 separate areas that were abnormal. There is some linear enhancement in the 12:00 region as well as an indeterminate mass in the lateral periareolar region of the right breast. There are no abnormal appearing lymph nodes. She went biopsy of both of these. The 12:00 lesion ends up being an invasive ductal carcinoma with DCIS that is grade 3. There is no LVI. This is a triple negative breast cancer with a Ki-67 of 60%. The other biopsy ends up being fibrocystic change that is negative. This is concordant.she has completed chemotherapy. She has done well. Repeat mri shows decrease in right breast cancer now 0.7 cm. Nodes still negative. She is here to discuss surgery with her husband  Review of Systems: A complete review of systems was obtained from the patient. I have reviewed this information and discussed as appropriate with the patient. See HPI as well for other ROS.  Review of Systems  All other systems reviewed and are negative.   Medical History: Past Medical History:  Diagnosis Date  Anxiety  Arthritis  Chronic kidney disease  GERD (gastroesophageal reflux disease)  History of cancer  Hypertension  Thyroid disease   There is no problem list on file for this patient.  Past Surgical History:  Procedure Laterality Date  ABDOMINAL HYSTERECTOMY W/ PARTIAL VAGINACTOMY  MASTECTOMY PARTIAL / LUMPECTOMY Left  mammosite  THYROIDECTOMY TOTAL  2011    Allergies  Allergen Reactions  Amoxicillin Shortness Of Breath and Other (See  Comments)   Current Outpatient Medications on File Prior to Visit  Medication Sig Dispense Refill  amLODIPine (NORVASC) 10 MG tablet Take 1 tablet by mouth once daily  hydroCHLOROthiazide (HYDRODIURIL) 25 MG tablet  metoprolol succinate (TOPROL-XL) 100 MG XL tablet Take 1 tablet by mouth once daily   Family History  Problem Relation Age of Onset  High blood pressure (Hypert[place]) Mother  High blood pressure (Hypertension) Father  High blood pressure (Hypertension) Sister    Social History   Tobacco Use  Smoking Status Never  Smokeless Tobacco Never  Marital status: Married  Tobacco Use  Smoking status: Never  Smokeless tobacco: Never  Vaping Use  Vaping Use: Never used  Substance and Sexual Activity  Alcohol use: Yes  Drug use: Never   Objective:   Physical Exam Vitals reviewed.  Constitutional:  Appearance: Normal appearance.  Chest:  Breasts: Right: No inverted nipple, mass or nipple discharge.  Comments: Port in place right chest Lymphadenopathy:  Upper Body:  Right upper body: No supraclavicular or axillary adenopathy.  Neurological:  Mental Status: She is alert.    Assessment and Plan:   Right breast cancer s/p primary chemotherapy  Right breast seed guided lumpectomy, right ax sn biopsy, port removal (Ok with oncology)  She has a favorable response to chemotherapy and she is a bct candidate.   We discussed a sentinel lymph node biopsy as she does not appear to having lymph node involvement right now or previously. We discussed the performance of that with injection of tracer  with small risk of staining.. We discussed that there is a chance of having a positive node with a sentinel lymph node biopsy and we will await the permanent pathology to make any other first further decisions in terms of her treatment. We discussed up to a 5% risk lifetime of chronic shoulder pain as well as lymphedema associated with a sentinel lymph node biopsy.  We discussed the  options for treatment of the breast cancer which included lumpectomy versus a mastectomy. We discussed the performance of the lumpectomy with radioactive seed placement. We discussed a 5-10% chance of a positive margin requiring reexcision in the operating room. We also discussed that she will be recommended radiation therapy if she undergoes lumpectomy. We discussed mastectomy and the postoperative care for that as well. Mastectomy can be followed by reconstruction. The decision for lumpectomy vs mastectomy has no impact on decision for chemotherapy. Most mastectomy patients will not need radiation therapy. We discussed that there is no difference in her survival whether she undergoes lumpectomy with radiation therapy or antiestrogen therapy versus a mastectomy. There is also no real difference between her recurrence in the breast.  We discussed the risks of operation including bleeding, infection, possible reoperation. She understands her further therapy will be based on what her stages at the time of her operation.

## 2022-09-14 NOTE — Transfer of Care (Signed)
Immediate Anesthesia Transfer of Care Note  Patient: Karina Ferguson  Procedure(s) Performed: RIGHT BREAST LUMPECTOMY WITH RADIOACTIVE SEED (Right: Breast) REMOVAL PORT-A-CATH RADIOACTIVE SEED GUIDED EXCISIONAL BREAST BIOPSY (Right) Right AXILLARY SENTINEL LYMPH NODE (Right: Axilla)  Patient Location: PACU  Anesthesia Type:General and Regional  Level of Consciousness: drowsy  Airway & Oxygen Therapy: Patient Spontanous Breathing and Patient connected to nasal cannula oxygen  Post-op Assessment: Report given to RN  Post vital signs: Reviewed and stable  Last Vitals:  Vitals Value Taken Time  BP 119/80 09/14/22 1710  Temp    Pulse 73 09/14/22 1714  Resp 20 09/14/22 1714  SpO2 99 % 09/14/22 1714  Vitals shown include unvalidated device data.  Last Pain:  Vitals:   09/14/22 1500  TempSrc:   PainSc: 0-No pain      Patients Stated Pain Goal: 0 (91/50/41 [id number])  Complications: No notable events documented.

## 2022-09-14 NOTE — Anesthesia Procedure Notes (Addendum)
Anesthesia Regional Block: Pectoralis block   Pre-Anesthetic Checklist: , timeout performed,  Correct Patient, Correct Site, Correct Laterality,  Correct Procedure, Correct Position, site marked,  Risks and benefits discussed,  Pre-op evaluation,  At surgeon's request and post-op pain management  Laterality: Right  Prep: Maximum Sterile Barrier Precautions used, chloraprep       Needles:  Injection technique: Single-shot  Needle Type: Echogenic Stimulator Needle     Needle Length: 9cm  Needle Gauge: 21     Additional Needles:   Procedures:,,,, ultrasound used (permanent image in chart),,    Narrative:  Start time: 09/14/2022 1:58 PM End time: 09/14/2022 2:08 PM Injection made incrementally with aspirations every 5 mL.  Performed by: Personally  Anesthesiologist: Roderic Palau, MD

## 2022-09-14 NOTE — Discharge Instructions (Signed)
[place] Office Phone Number [phone]  POST OP INSTRUCTIONS Take 400 mg of ibuprofen every 8 hours or 650 mg tylenol every 6 hours for next 72 hours then as needed. Use ice several times daily also.  A prescription for pain medication may be given to you upon discharge.  Take your pain medication as prescribed, if needed.  If narcotic pain medicine is not needed, then you may take acetaminophen (Tylenol), naprosyn (Alleve) or ibuprofen (Advil) as needed. Take your usually prescribed medications unless otherwise directed If you need a refill on your pain medication, please contact your pharmacy.  They will contact our office to request authorization.  Prescriptions will not be filled after 5pm or on week-ends. You should eat very light the first 24 hours after surgery, such as soup, crackers, pudding, etc.  Resume your normal diet the day after surgery. Most patients will experience some swelling and bruising in the breast.  Ice packs and a good support bra will help.  Wear the breast binder provided or a sports bra for 72 hours day and night.  After that wear a sports bra during the day until you return to the office. Swelling and bruising can take several days to resolve.  It is common to experience some constipation if taking pain medication after surgery.  Increasing fluid intake and taking a stool softener will usually help or prevent this problem from occurring.  A mild laxative (Milk of Magnesia or Miralax) should be taken according to package directions if there are no bowel movements after 48 hours. I used skin glue on the incision, you may shower in 24 hours.  The glue will flake off over the next 2-3 weeks.  Any sutures or staples will be removed at the office during your follow-up visit. ACTIVITIES:  You may resume regular daily activities (gradually increasing) beginning the next day.  Wearing a good support bra or sports bra minimizes pain and swelling.  You may have  sexual intercourse when it is comfortable. You may drive when you no longer are taking prescription pain medication, you can comfortably wear a seatbelt, and you can safely maneuver your car and apply brakes. RETURN TO WORK:  ______________________________________________________________________________________ Dennis Bast should see your doctor in the office for a follow-up appointment approximately two weeks after your surgery.  Your doctor's nurse will typically make your follow-up appointment when she calls you with your pathology report.  Expect your pathology report 3-4 business days after your surgery.  You may call to check if you do not hear from [place] after three days. OTHER INSTRUCTIONS: _______________________________________________________________________________________________ _____________________________________________________________________________________________________________________________________ _____________________________________________________________________________________________________________________________________ _____________________________________________________________________________________________________________________________________  WHEN TO CALL DR Emmakate Hypes: Fever over 101.0 Nausea and/or vomiting. Extreme swelling or bruising. Continued bleeding from incision. Increased pain, redness, or drainage from the incision.  The clinic staff is available to answer your questions during regular business hours.  Please don't hesitate to call and ask to speak to one of the nurses for clinical concerns.  If you have a medical emergency, go to the nearest emergency room or call 911.  A surgeon from [facility] is always on call at the hospital.  For further questions, please visit [email or web address]

## 2022-09-14 NOTE — Interval H&P Note (Signed)
History and Physical Interval Note:  09/14/2022 3:04 PM  Karina Ferguson  has presented today for surgery, with the diagnosis of BREAST CANCER.  The various methods of treatment have been discussed with the patient and family. After consideration of risks, benefits and other options for treatment, the patient has consented to  Procedure(s): RIGHT BREAST LUMPECTOMY WITH RADIOACTIVE SEED AND AXILLARY SENTINEL LYMPH NODE BIOPSY (Right) REMOVAL PORT-A-CATH (N/A) RADIOACTIVE SEED GUIDED EXCISIONAL BREAST BIOPSY (Right) as a surgical intervention.  The patient's history has been reviewed, patient examined, no change in status, stable for surgery.  I have reviewed the patient's chart and labs.  Questions were answered to the patient's satisfaction.     Rolm Bookbinder

## 2022-09-14 NOTE — Anesthesia Preprocedure Evaluation (Signed)
Anesthesia Evaluation  Patient identified by MRN, date of birth, ID band Patient awake    Reviewed: Allergy & Precautions, H&P , NPO status , Patient's Chart, lab work & pertinent test results, reviewed documented beta blocker date and time   Airway Mallampati: III  TM Distance: >3 FB Neck ROM: Full    Dental no notable dental hx. (+) Teeth Intact, Dental Advisory Given   Pulmonary sleep apnea and Continuous Positive Airway Pressure Ventilation    Pulmonary exam normal breath sounds clear to auscultation       Cardiovascular hypertension, Pt. on medications and Pt. on home beta blockers  Rhythm:Regular Rate:Normal     Neuro/Psych   Anxiety     negative neurological ROS     GI/Hepatic Neg liver ROS,GERD  Medicated,,  Endo/Other  negative endocrine ROS    Renal/GU negative Renal ROS  negative genitourinary   Musculoskeletal  (+) Arthritis , Osteoarthritis,    Abdominal   Peds  Hematology  (+) Blood dyscrasia, anemia   Anesthesia Other Findings   Reproductive/Obstetrics negative OB ROS                             Anesthesia Physical Anesthesia Plan  ASA: 2  Anesthesia Plan: General   Post-op Pain Management: Regional block* and Tylenol PO (pre-op)*   Induction: Intravenous  PONV Risk Score and Plan: 4 or greater and Ondansetron, Dexamethasone and Midazolam  Airway Management Planned: LMA  Additional Equipment:   Intra-op Plan:   Post-operative Plan: Extubation in OR  Informed Consent: I have reviewed the patients History and Physical, chart, labs and discussed the procedure including the risks, benefits and alternatives for the proposed anesthesia with the patient or authorized representative who has indicated his/her understanding and acceptance.     Dental advisory given  Plan Discussed with: CRNA  Anesthesia Plan Comments:        Anesthesia Quick Evaluation

## 2022-09-15 ENCOUNTER — Observation Stay (HOSPITAL_COMMUNITY): Payer: Medicare Other

## 2022-09-15 ENCOUNTER — Other Ambulatory Visit: Payer: Self-pay

## 2022-09-15 ENCOUNTER — Encounter (HOSPITAL_COMMUNITY): Payer: Self-pay | Admitting: General Surgery

## 2022-09-15 ENCOUNTER — Telehealth: Payer: Self-pay | Admitting: Radiation Oncology

## 2022-09-15 DIAGNOSIS — Z880 Allergy status to penicillin: Secondary | ICD-10-CM | POA: Diagnosis not present

## 2022-09-15 DIAGNOSIS — Z171 Estrogen receptor negative status [ER-]: Secondary | ICD-10-CM | POA: Diagnosis not present

## 2022-09-15 DIAGNOSIS — J95811 Postprocedural pneumothorax: Secondary | ICD-10-CM | POA: Diagnosis not present

## 2022-09-15 DIAGNOSIS — E890 Postprocedural hypothyroidism: Secondary | ICD-10-CM | POA: Diagnosis present

## 2022-09-15 DIAGNOSIS — Z8249 Family history of ischemic heart disease and other diseases of the circulatory system: Secondary | ICD-10-CM | POA: Diagnosis not present

## 2022-09-15 DIAGNOSIS — F419 Anxiety disorder, unspecified: Secondary | ICD-10-CM | POA: Diagnosis present

## 2022-09-15 DIAGNOSIS — Z9221 Personal history of antineoplastic chemotherapy: Secondary | ICD-10-CM | POA: Diagnosis not present

## 2022-09-15 DIAGNOSIS — M1990 Unspecified osteoarthritis, unspecified site: Secondary | ICD-10-CM | POA: Diagnosis present

## 2022-09-15 DIAGNOSIS — C50811 Malignant neoplasm of overlapping sites of right female breast: Secondary | ICD-10-CM | POA: Diagnosis present

## 2022-09-15 DIAGNOSIS — I10 Essential (primary) hypertension: Secondary | ICD-10-CM | POA: Diagnosis present

## 2022-09-15 DIAGNOSIS — Z888 Allergy status to other drugs, medicaments and biological substances status: Secondary | ICD-10-CM | POA: Diagnosis not present

## 2022-09-15 DIAGNOSIS — K219 Gastro-esophageal reflux disease without esophagitis: Secondary | ICD-10-CM | POA: Diagnosis present

## 2022-09-15 DIAGNOSIS — Z9889 Other specified postprocedural states: Secondary | ICD-10-CM | POA: Diagnosis present

## 2022-09-15 DIAGNOSIS — Z853 Personal history of malignant neoplasm of breast: Secondary | ICD-10-CM | POA: Diagnosis not present

## 2022-09-15 MED ORDER — LIDOCAINE HCL (PF) 1 % IJ SOLN
INTRAMUSCULAR | Status: AC
  Filled 2022-09-15: qty 30

## 2022-09-15 MED ORDER — LIP MEDEX EX OINT
TOPICAL_OINTMENT | CUTANEOUS | Status: DC | PRN
  Filled 2022-09-15: qty 7

## 2022-09-15 MED ORDER — DOCUSATE SODIUM 100 MG PO CAPS
100.0000 mg | ORAL_CAPSULE | Freq: Two times a day (BID) | ORAL | Status: DC
  Administered 2022-09-15 – 2022-09-17 (×4): 100 mg via ORAL
  Filled 2022-09-15 (×4): qty 1

## 2022-09-15 NOTE — Anesthesia Postprocedure Evaluation (Signed)
Anesthesia Post Note  Patient: Karina Ferguson  Procedure(s) Performed: RIGHT BREAST LUMPECTOMY WITH RADIOACTIVE SEED (Right: Breast) REMOVAL PORT-A-CATH RADIOACTIVE SEED GUIDED EXCISIONAL BREAST BIOPSY (Right) Right AXILLARY SENTINEL LYMPH NODE (Right: Axilla)     Patient location during evaluation: Other Anesthesia Type: General and Regional Level of consciousness: awake and alert Pain management: pain level controlled Vital Signs Assessment: post-procedure vital signs reviewed and stable Respiratory status: spontaneous breathing, nonlabored ventilation, respiratory function stable and patient connected to nasal cannula oxygen Cardiovascular status: blood pressure returned to baseline and stable Postop Assessment: no apparent nausea or vomiting Anesthetic complications: yes Comments: Post op CXR reveals a small apical pneumothorax, likely from the PECS block. I spoke with Dr. Donne Hazel and he will admit her overnight. A repeat CXR in the morning will be done and I expect her to be discharged in the morning.  No notable events documented.  Last Vitals:  Vitals:   09/15/22 0121 09/15/22 0518  BP: (!) 146/84 (!) 142/78  Pulse: 87 84  Resp: 18 18  Temp: 36.8 C 36.9 C  SpO2: 100% 99%    Last Pain:  Vitals:   09/15/22 0518  TempSrc: Oral  PainSc:                  Jessieca Rhem,W. EDMOND

## 2022-09-15 NOTE — Procedures (Addendum)
Chest tube insertion  Date/Time: 09/15/2022 9:20 AM  Performed by: Georganna Skeans, MD Authorized by: Georganna Skeans, MD   Consent:    Consent obtained:  Written   Consent given by:  Patient Pre-procedure details:    Skin preparation:  ChloraPrep Anesthesia (see MAR for exact dosages):    Anesthesia method:  Local infiltration Procedure details:    Placement location:  R lateral   Tube size (Fr):  Minicatheter   Technique: Seldinger     Tube connected to:  Suction   Drainage characteristics:  Air only   Dressing:  4x4 sterile gauze Post-procedure details:    Patient tolerance of procedure:  Tolerated well, no immediate complications Comments:     CXR pending  Surgeon: Georganna Skeans Assistant: Alferd Apa, PA-C  Georganna Skeans, MD, MPH, FACS Please use [email or web address] to contact on call provider

## 2022-09-15 NOTE — Telephone Encounter (Signed)
LVM to schedule FUN and SIM

## 2022-09-15 NOTE — Progress Notes (Signed)
Trauma Event Note    Reason for Call : Right chest tube placement  Initial Focused Assessment: VSS Pt A/Ox4 Pt slightly anxious Steri strips to R breast/chest from previous procedure  Interventions: -Procedure explained to patient -Consent signed  -Timeout performed -[facility] notified - paper signed and placed at bedside -Assisted Legrand Como, [place] and Dr Grandville Silos in placing R chest tube -Sutured in place -Dressed and secured chest tube -[place] placed to -20cm H2O -CXR ordered and obtained -Communicated with bedside RN about plan of care and giving patient pain meds  Plan of Care: CXR Pain management Continue CT to suction  Event Summary: Patient developed a pneumothorax post procedure post-op day 1.  Pigtail chest tube placed at bedside to re-inflate lung.   Last imported Vital Signs BP (!) 142/78 (BP Location: Left Arm)   Pulse 84   Temp 98.5 F (36.9 C) (Oral)   Resp 18   Ht '5\' 9"'$  (1.753 m)   Wt 227 lb (103 kg)   SpO2 99%   BMI 33.52 kg/m   Trending CBC No results for input(s): "WBC", "HGB", "HCT", "PLT" in the last 72 hours.  Trending Coag's No results for input(s): "APTT", "INR" in the last 72 hours.  Trending BMET No results for input(s): "NA", "K", "CL", "CO2", "BUN", "CREATININE", "GLUCOSE" in the last 72 hours.   Clovis Cao  Trauma Response RN  Please call TRN at [phone] for further assistance.

## 2022-09-15 NOTE — Progress Notes (Addendum)
1 Day Post-Op   Subjective/Chief Complaint: No complaints this am   Objective: Vital signs in last 24 hours: Temp:  [97.7 F (36.5 C)-98.5 F (36.9 C)] 98.5 F (36.9 C) (01/19 0518) Pulse Rate:  [66-116] 84 (01/19 0518) Resp:  [16-21] 18 (01/19 0518) BP: (113-156)/(61-89) 142/78 (01/19 0518) SpO2:  [90 %-100 %] 99 % (01/19 0518) Weight:  [103 kg] 103 kg (01/18 1312) Last BM Date : 09/15/22  Intake/Output from previous day: 01/18 0701 - 01/19 0700 In: 200 [I.V.:200] Out: 505 [Urine:500; Blood:5] Intake/Output this shift: No intake/output data recorded.  Bilateral breath sounds No hematomas  Lab Results:  No results for input(s): "WBC", "HGB", "HCT", "PLT" in the last 72 hours. BMET No results for input(s): "NA", "K", "CL", "CO2", "GLUCOSE", "BUN", "CREATININE", "CALCIUM" in the last 72 hours. PT/INR No results for input(s): "LABPROT", "INR" in the last 72 hours. ABG No results for input(s): "PHART", "HCO3" in the last 72 hours.  Invalid input(s): "PCO2", "PO2"  Studies/Results: DG CHEST PORT 1 VIEW  Result Date: 09/14/2022 CLINICAL DATA:  Evaluate for right-sided pneumothorax. EXAM: PORTABLE CHEST 1 VIEW COMPARISON:  August 31, 2021 FINDINGS: The heart size and mediastinal contours are within normal limits. There is a small right apical pneumothorax. There is no evidence of acute infiltrate or pleural effusion. Radiopaque surgical clips are seen within the anterior neck soft tissues on the left and lateral soft tissues of the right breast. The visualized skeletal structures are unremarkable. IMPRESSION: Small right apical pneumothorax. Electronically Signed   By: Virgina Norfolk M.D.   On: 09/14/2022 17:50   MM Breast Surgical Specimen  Result Date: 09/14/2022 CLINICAL DATA:  Status post right breast lumpectomy. EXAM: SPECIMEN RADIOGRAPH OF THE RIGHT BREAST COMPARISON:  Previous exam(s). FINDINGS: Status post excision of the right breast. The radioactive seed and  barbell biopsy marker clip are present and completely intact. IMPRESSION: Specimen radiograph of the right breast. Electronically Signed   By: Lovey Newcomer M.D.   On: 09/14/2022 16:35  MM Breast Surgical Specimen  Result Date: 09/14/2022 CLINICAL DATA:  Evaluate specimen EXAM: SPECIMEN RADIOGRAPH OF THE RIGHT BREAST COMPARISON:  Previous exam(s). FINDINGS: Status post excision of the right breast. The cylinder shaped radioactive seed and biopsy marker clip are present, completely intact, and were marked for pathology. IMPRESSION: Specimen radiograph of the right breast. Electronically Signed   By: Dorise Bullion III M.D.   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 The patient recently completed neoadjuvant chemotherapy. Radioactive seed localization of both lesions is performed in anticipation of lumpectomy and excisional biopsy. EXAM: MAMMOGRAPHIC GUIDED RADIOACTIVE SEED LOCALIZATION OF THE RIGHT BREAST x 2 COMPARISON:  Previous exam(s). FINDINGS: Patient presents for radioactive seed localization prior to RIGHT breast lumpectomy and RIGHT breast excisional biopsy. I met with the patient and we discussed the procedure of seed localization including benefits and alternatives. We discussed the high likelihood of a successful procedure. We discussed the risks of the procedure including infection, bleeding, tissue injury and further surgery. We discussed the low dose of radioactivity involved in the procedure. Informed, written consent was given. The usual time-out protocol was performed immediately prior to the procedure. Initially, using mammographic guidance, sterile technique with chlorhexidine as skin antisepsis,  1% lidocaine  as local anesthetic, an I-125 radioactive seed was used to localize the cylinder shaped tissue marking clip associated with the biopsy-proven IDC and DCIS in the [place] at posterior depth using a lateral approach. Subsequently, using mammographic guidance, sterile technique with chlorhexidine as skin antisepsis, 1% lidocaine as local anesthetic, an I-125 radioactive seed was used to localize the hourglass shaped tissue marking clip associated with the biopsy-proven adenomyoepithelioma in the outer subareolar location using a lateral approach. The follow-up mammogram images confirm that both seeds are in the expected location immediately adjacent to the biopsy clips. The images are marked for Dr. Donne Hazel. Follow-up survey of the patient confirms the presence of the radioactive seed. Seed associated with cylinder clip: Order number of I-125 seed: [id number] Total activity: 0.242 mCi Reference Date: 08/16/2022 Seed associated with hourglass clip: Order number of I-125 seed: [id number] Total activity: 0.247 mCi Reference date: 08/04/2022 The patient tolerated the procedure well and was released from the [place]. She was given instructions regarding seed removal. 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 No apparent immediate complications. Electronically Signed   By: Evangeline Dakin M.D.   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 The patient recently completed neoadjuvant chemotherapy. Radioactive seed  localization of both lesions is performed in anticipation of lumpectomy and excisional biopsy. EXAM: MAMMOGRAPHIC GUIDED RADIOACTIVE SEED LOCALIZATION OF THE RIGHT BREAST x 2 COMPARISON:  Previous exam(s). FINDINGS: Patient presents for radioactive seed localization prior to RIGHT breast lumpectomy and RIGHT breast excisional biopsy. I met with the patient and we discussed the procedure of seed localization including benefits and alternatives. We discussed the high likelihood of a successful procedure. We discussed the risks of the procedure including infection, bleeding, tissue injury and further surgery. We discussed the low dose of radioactivity involved in the procedure. Informed, written consent was given. The usual time-out protocol was performed immediately prior to the procedure. Initially, using mammographic guidance, sterile technique with chlorhexidine as skin antisepsis, 1% lidocaine as local anesthetic, an I-125 radioactive seed was used to localize the cylinder shaped tissue marking clip associated with the biopsy-proven IDC and DCIS in the [place] at posterior depth using a lateral approach. Subsequently, using mammographic guidance, sterile technique with chlorhexidine as skin antisepsis, 1% lidocaine as local anesthetic, an I-125 radioactive seed was used to localize the hourglass shaped tissue marking clip associated with the biopsy-proven adenomyoepithelioma in the outer subareolar location using a lateral approach. The follow-up mammogram images confirm that both seeds are in the expected location immediately adjacent to the biopsy clips. The images are marked for Dr. Donne Hazel. Follow-up survey of the patient confirms the presence of the radioactive seed. Seed associated with cylinder clip: Order number of I-125 seed: [id number] Total activity: 0.242 mCi Reference Date: 08/16/2022 Seed associated with hourglass clip: Order number of I-125 seed: [id number] Total activity: 0.247 mCi  Reference date: 08/04/2022 The patient tolerated the procedure well and was released from the [place]. She was given instructions regarding seed removal. 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 No apparent immediate complications. Electronically Signed   By: Evangeline Dakin M.D.   On: 09/13/2022 15:46   Anti-infectives: Anti-infectives (From admission, onward)    Start     Dose/Rate Route Frequency Ordered Stop   09/15/22 0600  ciprofloxacin (CIPRO) IVPB 400 mg  Status:  Discontinued        400 mg 200 mL/hr over 60 Minutes Intravenous On call to O.R. 09/14/22 1317 09/14/22 1318   09/14/22 1330  ciprofloxacin (CIPRO) IVPB 400 mg        400 mg 200 mL/hr over 60 Minutes Intravenous On call to O.R. 09/14/22 1317 09/14/22 1536       Assessment/Plan: POD 1 right lumpectomy/sn biopsy/port removal Small right apical PTX after block -ptx bigger today, will need pigtail placed -home meds, regular diet -pulm toilet   Rolm Bookbinder 09/15/2022

## 2022-09-15 NOTE — Progress Notes (Signed)
Pt. Refused cpap. 

## 2022-09-16 ENCOUNTER — Inpatient Hospital Stay (HOSPITAL_COMMUNITY): Payer: Medicare Other

## 2022-09-16 MED ORDER — OXYCODONE HCL 5 MG PO TABS: 5.0000 mg | ORAL_TABLET | ORAL | Status: DC | PRN

## 2022-09-16 NOTE — Progress Notes (Signed)
2 Days Post-Op  Subjective: CC: Some soreness over right breast and chest tube. Well controlled with medications. No sob. On RA. Pulling 1500 on IS. Tolerating diet without n/v. Voiding. BM this am. Mobilizing in room.   Objective: Vital signs in last 24 hours: Temp:  [98.9 F (37.2 C)-99.8 F (37.7 C)] 99.5 F (37.5 C) (01/19 2030) Pulse Rate:  [94-95] 95 (01/19 2030) Resp:  [18] 18 (01/19 2030) BP: (139-149)/(80-84) 149/84 (01/19 2030) SpO2:  [99 %-100 %] 100 % (01/19 2030) Last BM Date : 09/15/22  Intake/Output from previous day: 01/19 0701 - 01/20 0700 In: 480 [P.O.:480] Out: 400 [Urine:400] Intake/Output this shift: No intake/output data recorded.  PE: (chaperone, RN present) Gen:  Alert, NAD, pleasant Card:  RRR R breast: Incision cdi with steri-strips in place.  Pulm:  CTAB, no W/R/R, effort normal. On RA. R chest tube in place on -20, dressing cdi. Scant output in [place]. No air leak. 1500 on IS.  Abd: Soft, ND, NT  Psych: A&Ox3   Lab Results:  No results for input(s): "WBC", "HGB", "HCT", "PLT" in the last 72 hours. BMET No results for input(s): "NA", "K", "CL", "CO2", "GLUCOSE", "BUN", "CREATININE", "CALCIUM" in the last 72 hours. PT/INR No results for input(s): "LABPROT", "INR" in the last 72 hours. CMP     Component Value Date/Time   NA 136 09/08/2022 1340   K 3.8 09/08/2022 1340   CL 102 09/08/2022 1340   CO2 25 09/08/2022 1340   GLUCOSE 98 09/08/2022 1340   BUN 10 09/08/2022 1340   CREATININE 0.94 09/08/2022 1340   CREATININE 0.86 08/10/2022 0956   CALCIUM 10.1 09/08/2022 1340   PROT 6.2 (L) 08/10/2022 0956   ALBUMIN 4.0 08/10/2022 0956   AST 15 08/10/2022 0956   ALT 16 08/10/2022 0956   ALKPHOS 62 08/10/2022 0956   BILITOT 0.3 08/10/2022 0956   GFRNONAA >60 09/08/2022 1340   GFRNONAA >60 08/10/2022 0956   GFRAA (L) 07/25/2009 1243    56        The eGFR has been calculated using the MDRD equation. This calculation has not  been validated in all clinical situations. eGFR's persistently <60 mL/min signify possible Chronic Kidney Disease.   Lipase  No results found for: "LIPASE"  Studies/Results: DG Chest Portable 1 View  Result Date: 09/15/2022 CLINICAL DATA:  Right-sided chest tube placement EXAM: PORTABLE CHEST 1 VIEW COMPARISON:  09/15/2022 FINDINGS: Right-sided chest tube. No focal consolidation. No pleural effusion or pneumothorax. Heart and mediastinal contours are unremarkable. No acute osseous abnormality. IMPRESSION: 1. Right-sided chest tube. No pneumothorax. Electronically Signed   By: Kathreen Devoid M.D.   On: 09/15/2022 09:48   DG Chest 2 View  Result Date: 09/15/2022 CLINICAL DATA:  Follow up pneumothorax. Recent breast biopsy and surgery. EXAM: CHEST - 2 VIEW COMPARISON:  Radiographs 09/14/2022 and 08/31/2021. FINDINGS: Previously demonstrated right-sided pneumothorax has enlarged, now moderate in size (approximately 25%). There is no associated mediastinal shift. Mild right lung atelectasis is present. The left lung is clear. The heart size and mediastinal contours are stable. Postsurgical changes are present within the right breast and right axilla. There are additional surgical clips at the thoracic inlet. No acute osseous findings are demonstrated. IMPRESSION: Interval enlargement of right-sided pneumothorax, now moderate in size but without tension component. These results will be called to the ordering clinician or representative by the Radiologist Assistant, and communication documented in the PACS or [employer]. Electronically Signed   By: Gwyndolyn Saxon  Lin Landsman M.D.   On: 09/15/2022 08:25   DG CHEST PORT 1 VIEW  Result Date: 09/14/2022 CLINICAL DATA:  Evaluate for right-sided pneumothorax. EXAM: PORTABLE CHEST 1 VIEW COMPARISON:  August 31, 2021 FINDINGS: The heart size and mediastinal contours are within normal limits. There is a small right apical pneumothorax. There is no evidence of acute  infiltrate or pleural effusion. Radiopaque surgical clips are seen within the anterior neck soft tissues on the left and lateral soft tissues of the right breast. The visualized skeletal structures are unremarkable. IMPRESSION: Small right apical pneumothorax. Electronically Signed   By: Virgina Norfolk M.D.   On: 09/14/2022 17:50   MM Breast Surgical Specimen  Result Date: 09/14/2022 CLINICAL DATA:  Status post right breast lumpectomy. EXAM: SPECIMEN RADIOGRAPH OF THE RIGHT BREAST COMPARISON:  Previous exam(s). FINDINGS: Status post excision of the right breast. The radioactive seed and barbell biopsy marker clip are present and completely intact. IMPRESSION: Specimen radiograph of the right breast. Electronically Signed   By: Lovey Newcomer M.D.   On: 09/14/2022 16:35  MM Breast Surgical Specimen  Result Date: 09/14/2022 CLINICAL DATA:  Evaluate specimen EXAM: SPECIMEN RADIOGRAPH OF THE RIGHT BREAST COMPARISON:  Previous exam(s). FINDINGS: Status post excision of the right breast. The cylinder shaped radioactive seed and biopsy marker clip are present, completely intact, and were marked for pathology. IMPRESSION: Specimen radiograph of the right breast. Electronically Signed   By: Dorise Bullion III M.D.   On: 09/14/2022 16:12   Anti-infectives: Anti-infectives (From admission, onward)    Start     Dose/Rate Route Frequency Ordered Stop   09/15/22 0600  ciprofloxacin (CIPRO) IVPB 400 mg  Status:  Discontinued        400 mg 200 mL/hr over 60 Minutes Intravenous On call to O.R. 09/14/22 1317 09/14/22 1318   09/14/22 1330  ciprofloxacin (CIPRO) IVPB 400 mg        400 mg 200 mL/hr over 60 Minutes Intravenous On call to O.R. 09/14/22 1317 09/14/22 1536        Assessment/Plan POD 2 right lumpectomy/sn biopsy/port removal by Dr. Shawn Stall right apical PTX after block s/p R pigtail chest tube 1/19 - CT on -20. No air leak. Patient on RA and doing well with IS. No SOB. Xray pending this  am. Will follow up. If xray stable, will plan to move to [facility] and repeat CXR in am.  - Multimodal pain control.  - Pulm toilet - Mobilize  FEN - Reg VTE - SCDs, will confirm with attending okay to start lovenox ID - Peri-op. None currently.  Plan - Chest tube management.   HTN - home meds   LOS: 1 day    Jillyn Ledger , [facility] Surgery 09/16/2022, 7:46 AM Please see Amion for pager number during day hours 7:00am-4:30pm

## 2022-09-16 NOTE — Progress Notes (Signed)
Trauma Event Note  Chest tube removal per verbal order from Dr Bobbye Morton. Patient positioned in semi fowlers position. Sutures removed, valsalva practiced and chest tube clamped. Patient verbalized understanding, valsalva performed, and chest tube removed. No issues during removal. Patient voices no shortness of breath or pain during or after procedure. 4 hour post removal XR ordered for 2230.  Last imported Vital Signs BP (!) 154/97 (BP Location: Left Arm)   Pulse 100   Temp 98.9 F (37.2 C) (Oral)   Resp 16   Ht '5\' 9"'$  (1.753 m)   Wt 103 kg   SpO2 100%   BMI 33.52 kg/m   Karina Ferguson  Trauma Response RN  Please call TRN at [phone] for further assistance.

## 2022-09-17 ENCOUNTER — Inpatient Hospital Stay (HOSPITAL_COMMUNITY): Payer: Medicare Other

## 2022-09-17 NOTE — Discharge Summary (Signed)
Physician Discharge Summary  Patient ID: Karina Ferguson MRN: [id number] DOB/AGE: Apr 28, 1956 67 y.o.  Admit date: 09/14/2022 Discharge date: 09/17/2022  Admission Diagnoses: Right breast cancer s/p primary chemotherapy Right PTX after block  Discharge Diagnoses:  Principal Problem:   Status post right breast lumpectomy   Discharged Condition: good  Hospital Course: 43 yof s/p primary chemotherapy underwent right breast lumpectomy and excisional biopsy, port removal and sn biopsy.  She did well but had a right ptx after a block. This increased in size following morning and had a pigtail placed.  PTX resolved and chest tube removed.  Follow up cxr without ptx, will be discharged today  Consults: None  Significant Diagnostic Studies: xray  Treatments: surgery as above  Discharge Exam: Blood pressure (!) 163/95, pulse (!) 104, temperature 98.8 F (37.1 C), temperature source Oral, resp. rate 19, height '5\' 9"'$  (1.753 m), weight 103 kg, SpO2 100 %. Pulm effort normal Incisions clean   Disposition: Discharge disposition: 01-Home or Self Care        Allergies as of 09/17/2022       Reactions   Amoxicillin Shortness Of Breath   Esomeprazole Magnesium Diarrhea   Rabeprazole Diarrhea        Medication List     TAKE these medications    albuterol 108 (90 Base) MCG/ACT inhaler Commonly known as: VENTOLIN HFA Inhale 1-2 puffs into the lungs every 4 (four) hours as needed for wheezing or shortness of breath. PRN   amLODipine 10 MG tablet Commonly known as: NORVASC Take 10 mg by mouth daily.   azelastine 0.1 % nasal spray Commonly known as: ASTELIN Place 1 spray into both nostrils 2 (two) times daily. Use in each nostril as directed   dexamethasone 4 MG tablet Commonly known as: DECADRON Take 1 tablet day after chemo and 1 tablet 2 days after chemo with food   Fish Oil 1000 MG Caps Take 1,000 mg by mouth daily.   gabapentin 100 MG capsule Commonly known as:  NEURONTIN Take 100 mg by mouth daily as needed (pain). PRN for back pain   hydrochlorothiazide 25 MG tablet Commonly known as: HYDRODIURIL Take 25 mg by mouth daily.   hydrocortisone 2.5 % rectal cream Commonly known as: ANUSOL-HC Bid prn hemorrhoids   lidocaine-prilocaine cream Commonly known as: EMLA Apply to affected area once   metoprolol succinate 100 MG 24 hr tablet Commonly known as: TOPROL-XL Take 50 mg by mouth daily.   montelukast 10 MG tablet Commonly known as: SINGULAIR Take 10 mg by mouth daily.   Multi-Vitamin tablet Take 1 tablet by mouth daily.   omeprazole 20 MG capsule Commonly known as: PRILOSEC Take 20 mg by mouth daily.   ondansetron 8 MG tablet Commonly known as: Zofran Take 1 tablet every 8 hours as needed for nausea and vomiting. Start on the third day after doxorubicin/cyclophosphamide or carboplatin.   prochlorperazine 10 MG tablet Commonly known as: COMPAZINE Take 1 tablet (10 mg total) by mouth every 6 (six) hours as needed for nausea or vomiting.   traMADol 50 MG tablet Commonly known as: ULTRAM Take 1 tablet (50 mg total) by mouth every 6 (six) hours as needed. What changed: Another medication with the same name was added. Make sure you understand how and when to take each.   traMADol 50 MG tablet Commonly known as: ULTRAM Take 1 tablet (50 mg total) by mouth every 6 (six) hours as needed. What changed: You were already taking a medication with the same  name, and this prescription was added. Make sure you understand how and when to take each.   vitamin B-12 500 MCG tablet Commonly known as: CYANOCOBALAMIN Take 500 mcg by mouth daily.        Follow-up Information     Rolm Bookbinder, MD Follow up in 3 week(s).   Specialty: General Surgery Contact information: [address] [employer] [place] [address] [phone]                 Signed: Rolm Bookbinder 09/17/2022, 9:44 AM

## 2022-09-17 NOTE — TOC CAGE-AID Note (Signed)
Transition of Care [facility]) - CAGE-AID Screening   Patient Details  Name: Karina Ferguson MRN: [id number] Date of Birth: 08-02-1956  Transition of [facility]) CM/SW Contact:    Precious Bard, RN Phone Number: 09/17/2022, 12:25 PM   Clinical Narrative:  Patient denies any alcohol or drug use, no need for resources at this time.  CAGE-AID Screening:    Have You Ever Felt You Ought to Cut Down on Your Drinking or Drug Use?: No Have People Annoyed You By Critizing Your Drinking Or Drug Use?: No Have You Felt Bad Or Guilty About Your Drinking Or Drug Use?: No Have You Ever Had a Drink or Used Drugs First Thing In The Morning to Steady Your Nerves or to Get Rid of a Hangover?: No CAGE-AID Score: 0  Substance Abuse Education Offered: No

## 2022-09-19 ENCOUNTER — Other Ambulatory Visit: Payer: Self-pay

## 2022-09-19 LAB — SURGICAL PATHOLOGY

## 2022-09-27 MED FILL — Dexamethasone Sodium Phosphate Inj 100 MG/10ML: INTRAMUSCULAR | Qty: 1 | Status: AC

## 2022-09-27 NOTE — Assessment & Plan Note (Signed)
Breast MRI performed because of high breast density 05/10/2022: Right breast linear enhancement 1.9 cm at 12 o'clock position, indeterminate mass 8 mm in the lateral periareolar region right breast. 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   Treatment plan: 1.  Neoadjuvant with dose dense Adriamycin and Cytoxan x4 followed by Taxol and carbo x12 2. breast conserving surgery 3.  Adjuvant radiation ------------------------------------------------------------------------------------------------------------------------------------------------------- Current treatment: Completed 4 cycles of dose dense Adriamycin and Cytoxan 09/15/2022: Right lumpectomy: Grade 3 IDC 2.5 mm DCIS grade 3, margins negative, 0/1 lymph nodes negative  Pathology counseling: I discussed the final pathology report of the patient provided  a copy of this report. I discussed the margins as well as lymph node surgeries. We also discussed the final staging along with previously performed ER/PR and HER-2/neu testing.   Peripheral neuropathy: Patient has pre-existing severe peripheral neuropathy in the right foot as well as her hands.  Because of neuropathy we decided not to do Taxol.  Recommendation: Adjuvant radiation therapy followed by surveillance. Return to clinic after radiation is complete.

## 2022-09-28 ENCOUNTER — Inpatient Hospital Stay: Payer: Medicare Other | Attending: Genetic Counselor | Admitting: Hematology and Oncology

## 2022-09-28 ENCOUNTER — Encounter: Payer: Self-pay | Admitting: Family Medicine

## 2022-09-28 ENCOUNTER — Ambulatory Visit (INDEPENDENT_AMBULATORY_CARE_PROVIDER_SITE_OTHER): Payer: Medicare Other | Admitting: Family Medicine

## 2022-09-28 VITALS — BP 146/92 | HR 85 | Temp 98.6°F | Resp 17 | Wt 226.0 lb

## 2022-09-28 VITALS — BP 127/68 | HR 83 | Temp 99.3°F | Resp 16 | Ht 69.0 in | Wt 226.6 lb

## 2022-09-28 DIAGNOSIS — K219 Gastro-esophageal reflux disease without esophagitis: Secondary | ICD-10-CM | POA: Diagnosis not present

## 2022-09-28 DIAGNOSIS — I10 Essential (primary) hypertension: Secondary | ICD-10-CM | POA: Diagnosis not present

## 2022-09-28 DIAGNOSIS — C50411 Malignant neoplasm of upper-outer quadrant of right female breast: Secondary | ICD-10-CM | POA: Diagnosis present

## 2022-09-28 DIAGNOSIS — Z171 Estrogen receptor negative status [ER-]: Secondary | ICD-10-CM

## 2022-09-28 DIAGNOSIS — G629 Polyneuropathy, unspecified: Secondary | ICD-10-CM | POA: Insufficient documentation

## 2022-09-28 DIAGNOSIS — J309 Allergic rhinitis, unspecified: Secondary | ICD-10-CM

## 2022-09-28 MED ORDER — OMEPRAZOLE 20 MG PO CPDR: 20.0000 mg | DELAYED_RELEASE_CAPSULE | Freq: Every day | ORAL | 1 refills | Status: AC

## 2022-09-28 MED ORDER — METOPROLOL SUCCINATE ER 50 MG PO TB24: 50.0000 mg | ORAL_TABLET | Freq: Every day | ORAL | 1 refills | Status: DC

## 2022-09-28 MED ORDER — MONTELUKAST SODIUM 10 MG PO TABS: 10.0000 mg | ORAL_TABLET | Freq: Every day | ORAL | 1 refills | Status: AC

## 2022-09-28 NOTE — Assessment & Plan Note (Signed)
Stable. Continue current meds: amlodipine 10 mg daily, hctz 25 mg daily, metoprolol XL 50 mg daily Continue lifestyle measures Declined labs today

## 2022-09-28 NOTE — Progress Notes (Signed)
Patient Care Team: Nicola Girt, DO as PCP - General (Internal Medicine)  DIAGNOSIS:  Encounter Diagnosis  Name Primary?   Malignant neoplasm of upper-outer quadrant of right breast in female, estrogen receptor negative ([place]) Yes    SUMMARY OF ONCOLOGIC HISTORY: Oncology History  Malignant neoplasm of upper-outer quadrant of right breast in female, estrogen receptor negative ([place])  06/05/2022 Initial Diagnosis   Breast MRI detected 1.9 cm right breast linear enhancement at 12 o'clock position: Biopsy: Grade 3 IDC with high-grade DCIS ER 0% PR 0% Ki-67 60%, HER2 negative, 8 mm periareolar mass: Biopsy fibrocystic change (Prior history 2004 left breast DCIS ER/PR negative)   06/15/2022 Cancer Staging   Staging form: Breast, AJCC 8th Edition - Clinical: Stage IB (cT1c, cN0, cM0, G3, ER-, PR-, HER2-) - Signed by Nicholas Lose, MD on 06/15/2022 Stage prefix: Initial diagnosis Histologic grading system: 3 grade system   06/29/2022 -  Chemotherapy   Patient is on Treatment Plan : BREAST Dose Dense AC q14d / CARBOplatin D1 + PACLitaxel D1,8,15 q21d      Genetic Testing   [facility] Common Cancer Panel was Negative. 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 Report date is 06/28/2022.   The Common Hereditary Cancers + RNA Panel offered by [facility] includes sequencing, deletion/duplication, and RNA testing of the following 47 genes: APC, ATM, AXIN2, BARD1, BMPR1A, BRCA1, BRCA2, BRIP1, CDH1, CDK4*, CDKN2A (p14ARF)*, CDKN2A (p16INK4a)*, CHEK2, CTNNA1, DICER1, EPCAM (Deletion/duplication testing only), GREM1 (promoter region deletion/duplication testing only), KIT, MEN1, MLH1, MSH2, MSH3, MSH6, MUTYH, NBN, NF1, NHTL1, PALB2, PDGFRA*, PMS2, POLD1, POLE, PTEN, RAD50, RAD51C, RAD51D, SDHB, SDHC, SDHD, SMAD4, SMARCA4. STK11, TP53, TSC1, TSC2, and VHL.  The following genes were evaluated for sequence changes only: SDHA and HOXB13 c.251G>A variant only.  RNA  analysis is not performed for the * genes.     09/15/2022 Surgery   Right lumpectomy: Grade 3 IDC 2.5 mm DCIS grade 3, margins negative, 0/1 lymph nodes negative     CHIEF COMPLIANT: Follow up right lumpectomy after surgery  INTERVAL HISTORY: Hellen Shanley is a 67 year old with above-mentioned history of breast cancer Completed 4 cycles of dose dense Adriamycin and Cytoxan. She states surgery went well. She denies any pain. She says she is a little sore. She says the bra is very uncomfortable.  Following surgery she was admitted to the hospital with a pneumothorax as a result of nerve block.  She recovered fairly well with the chest tube and having to spend few days in the hospital.  She feels well and without any further issues related to that.   ALLERGIES:  is allergic to amoxicillin, esomeprazole magnesium, and rabeprazole.  MEDICATIONS:  Current Outpatient Medications  Medication Sig Dispense Refill   albuterol (VENTOLIN HFA) 108 (90 Base) MCG/ACT inhaler Inhale 1-2 puffs into the lungs every 4 (four) hours as needed for wheezing or shortness of breath. PRN 18 g 2   amLODipine (NORVASC) 10 MG tablet Take 10 mg by mouth daily.     azelastine (ASTELIN) 0.1 % nasal spray Place 1 spray into both nostrils 2 (two) times daily. Use in each nostril as directed 30 mL 2   dexamethasone (DECADRON) 4 MG tablet Take 1 tablet day after chemo and 1 tablet 2 days after chemo with food 8 tablet 0   gabapentin (NEURONTIN) 100 MG capsule Take 100 mg by mouth daily as needed (pain). PRN for back pain     hydrochlorothiazide (HYDRODIURIL) 25 MG  tablet Take 25 mg by mouth daily.     hydrocortisone (ANUSOL-HC) 2.5 % rectal cream Bid prn hemorrhoids 30 g 1   lidocaine-prilocaine (EMLA) cream Apply to affected area once 30 g 3   metoprolol succinate (TOPROL-XL) 100 MG 24 hr tablet Take 50 mg by mouth daily.     montelukast (SINGULAIR) 10 MG tablet Take 10 mg by mouth daily.     Multiple Vitamin (MULTI-VITAMIN)  tablet Take 1 tablet by mouth daily.     Omega-3 Fatty Acids (FISH OIL) 1000 MG CAPS Take 1,000 mg by mouth daily.     omeprazole (PRILOSEC) 20 MG capsule Take 20 mg by mouth daily.     ondansetron (ZOFRAN) 8 MG tablet Take 1 tablet every 8 hours as needed for nausea and vomiting. Start on the third day after doxorubicin/cyclophosphamide or carboplatin. 30 tablet 1   prochlorperazine (COMPAZINE) 10 MG tablet Take 1 tablet (10 mg total) by mouth every 6 (six) hours as needed for nausea or vomiting. 30 tablet 1   traMADol (ULTRAM) 50 MG tablet Take 1 tablet (50 mg total) by mouth every 6 (six) hours as needed. 10 tablet 0   traMADol (ULTRAM) 50 MG tablet Take 1 tablet (50 mg total) by mouth every 6 (six) hours as needed. 10 tablet 0   vitamin B-12 (CYANOCOBALAMIN) 500 MCG tablet Take 500 mcg by mouth daily.     No current facility-administered medications for this visit.    PHYSICAL EXAMINATION: ECOG PERFORMANCE STATUS: 1 - Symptomatic but completely ambulatory  Vitals:   09/28/22 1119  BP: (!) 146/92  Pulse: 85  Resp: 17  Temp: 98.6 F (37 C)  SpO2: 100%   Filed Weights   09/28/22 1119  Weight: 226 lb (102.5 kg)     LABORATORY DATA:  I have reviewed the data as listed    Latest Ref Rng & Units 09/08/2022    1:40 PM 08/10/2022    9:56 AM 07/27/2022    9:31 AM  CMP  Glucose 70 - 99 mg/dL 98  128  121   BUN 8 - 23 mg/dL '10  6  11   '$ Creatinine 0.44 - 1.00 mg/dL 0.94  0.86  0.89   Sodium 135 - 145 mmol/L 136  142  139   Potassium 3.5 - 5.1 mmol/L 3.8  3.6  3.7   Chloride 98 - 111 mmol/L 102  108  105   CO2 22 - 32 mmol/L '25  27  26   '$ Calcium 8.9 - 10.3 mg/dL 10.1  10.2  10.5   Total Protein 6.5 - 8.1 g/dL  6.2  7.1   Total Bilirubin 0.3 - 1.2 mg/dL  0.3  0.4   Alkaline Phos 38 - 126 U/L  62  61   AST 15 - 41 U/L  15  15   ALT 0 - 44 U/L  16  16     Lab Results  Component Value Date   WBC 6.4 09/08/2022   HGB 11.7 (L) 09/08/2022   HCT 35.6 (L) 09/08/2022   MCV 94.2  09/08/2022   PLT 243 09/08/2022   NEUTROABS 5.5 08/10/2022    ASSESSMENT & PLAN:  Malignant neoplasm of upper-outer quadrant of right breast in female, estrogen receptor negative ([place]) Breast MRI performed because of high breast density 05/10/2022: Right breast linear enhancement 1.9 cm at 12 o'clock position, indeterminate mass 8 mm in the lateral periareolar region right breast. 06/05/2022: Right breast biopsy 12 o'clock position: Grade 3  IDC with high-grade DCIS ER 0%, PR 0%, Ki-67 60%, HER2 1+ negative, second biopsy was fibrocystic change   (2004 left breast DCIS ER/PR negative).   Treatment plan: 1.  Neoadjuvant with dose dense Adriamycin and Cytoxan x4 followed by Taxol and carbo x12 2. breast conserving surgery 3.  Adjuvant radiation ------------------------------------------------------------------------------------------------------------------------------------------------------- Current treatment: Completed 4 cycles of dose dense Adriamycin and Cytoxan 09/15/2022: Right lumpectomy: Grade 3 IDC 2.5 mm DCIS grade 3, margins negative, 0/1 lymph nodes negative  Pathology counseling: I discussed the final pathology report of the patient provided  a copy of this report. I discussed the margins as well as lymph node surgeries. We also discussed the final staging along with previously performed ER/PR and HER-2/neu testing.   Peripheral neuropathy: Patient has pre-existing severe peripheral neuropathy in the right foot as well as her hands.  Because of neuropathy we decided not to do Taxol.  Recommendation: Adjuvant radiation therapy followed by surveillance. Return to clinic after radiation is complete.    No orders of the defined types were placed in this encounter.  The patient has a good understanding of the overall plan. she agrees with it. she will call with any problems that may develop before the next visit here. Total time spent: 30 mins including face to face time and time  spent for planning, charting and co-ordination of care   Harriette Ohara, MD 09/28/22    I Gardiner Coins am acting as a Education administrator for [employer]  I have reviewed the above documentation for accuracy and completeness, and I agree with the above.

## 2022-09-28 NOTE — Assessment & Plan Note (Signed)
Stable. No acute concerns. Refill Prilosec

## 2022-09-28 NOTE — Assessment & Plan Note (Signed)
Stable. No acute concerns. Refill Singulair.

## 2022-09-28 NOTE — Assessment & Plan Note (Signed)
Patient in good spirits today. Reports good support system.  Scheduled for radiation consult in a few weeks.

## 2022-09-28 NOTE — Progress Notes (Signed)
New Patient Office Visit  Subjective    Patient ID: Karina Ferguson, female    DOB: 1955-11-12  Age: 67 y.o. MRN: [id number]  CC: establish care  HPI Karina Ferguson presents to establish care. Previous provider was in [place] and she wanted to be closer to [employer].   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 She is following with [facility]. On 09/15/22 she had a lumpectomy - following surgery she was admitted to hospital with pneumothorax as a result of nerve block. She last followed up with oncology earlier today and they are planning for radiation. Consultation is scheduled for 10/11/22 per patient.  - Reports she is doing pretty well overall; still a little sore/tender from the surgery. Reports she has been overwhelmed, but has a very good support system.  - She had brast cancer to left breast about 20 years ago.    Hypertension: - Medications: amlodipine 10 mg daily, hctz 25 mg daily, metoprolol XL 50 mg daily - Compliance: good - Checking BP at home: yes, good at home - Denies any SOB, recurrent headaches, CP, vision changes, LE edema, dizziness, palpitations, or medication side effects.   B12 Deficiency: - Taking OTC vitamin B12 500 mcg daily   OSA: - Sleeps with CPAP - has been having some seal problems lately - has an appointment to have it checked tomorrow - Hasn't been sleeping good lately, doesn't feel well rested when she wakes up       Outpatient Encounter Medications as of 09/28/2022  Medication Sig   amLODipine (NORVASC) 10 MG tablet Take 10 mg by mouth daily.   gabapentin (NEURONTIN) 100 MG capsule Take 100 mg by mouth daily as needed (pain). PRN for back pain   hydrochlorothiazide (HYDRODIURIL) 25 MG tablet Take 25 mg by mouth daily.   Multiple Vitamin (MULTI-VITAMIN) tablet Take 1 tablet by mouth daily.   Omega-3 Fatty Acids (FISH OIL)  1000 MG CAPS Take 1,000 mg by mouth daily.   vitamin B-12 (CYANOCOBALAMIN) 500 MCG tablet Take 500 mcg by mouth daily.   [DISCONTINUED] metoprolol succinate (TOPROL-XL) 100 MG 24 hr tablet Take 50 mg by mouth daily.   [DISCONTINUED] montelukast (SINGULAIR) 10 MG tablet Take 10 mg by mouth daily.   [DISCONTINUED] omeprazole (PRILOSEC) 20 MG capsule Take 20 mg by mouth daily.   metoprolol succinate (TOPROL-XL) 50 MG 24 hr tablet Take 1 tablet (50 mg total) by mouth daily.   montelukast (SINGULAIR) 10 MG tablet Take 1 tablet (10 mg total) by mouth daily.   omeprazole (PRILOSEC) 20 MG capsule Take 1 capsule (20 mg total) by mouth daily.   [DISCONTINUED] albuterol (VENTOLIN HFA) 108 (90 Base) MCG/ACT inhaler Inhale 1-2 puffs into the lungs every 4 (four) hours as needed for wheezing or shortness of breath. PRN   [DISCONTINUED] azelastine (ASTELIN) 0.1 % nasal spray Place 1 spray into both nostrils 2 (two) times daily. Use in each nostril as directed   [DISCONTINUED] dexamethasone (DECADRON) 4 MG tablet Take 1 tablet day after chemo and 1 tablet 2 days after chemo with food   [DISCONTINUED] hydrocortisone (ANUSOL-HC) 2.5 % rectal cream Bid prn hemorrhoids   [DISCONTINUED] lidocaine-prilocaine (EMLA) cream Apply to affected area once   [DISCONTINUED] ondansetron (ZOFRAN) 8 MG tablet Take 1 tablet every 8 hours as needed for nausea and vomiting. Start on the third day after doxorubicin/cyclophosphamide or carboplatin.   [DISCONTINUED] prochlorperazine (  COMPAZINE) 10 MG tablet Take 1 tablet (10 mg total) by mouth every 6 (six) hours as needed for nausea or vomiting.   [DISCONTINUED] traMADol (ULTRAM) 50 MG tablet Take 1 tablet (50 mg total) by mouth every 6 (six) hours as needed.   [DISCONTINUED] traMADol (ULTRAM) 50 MG tablet Take 1 tablet (50 mg total) by mouth every 6 (six) hours as needed.   No facility-administered encounter medications on file as of 09/28/2022.    Past Medical History:  Diagnosis  Date   Allergic rhinitis    Allergy    Anemia    Anxiety    Arthritis    Breast cancer ([place]) 06/05/2022   Cancer ([place]) 04/2022   right breast IDC/DCIS   Chronic idiopathic constipation    GERD (gastroesophageal reflux disease)    Hyperlipidemia    Hypertension    Insomnia    Sleep apnea    wears CPAP nightly   Stage 3 chronic kidney disease ([place])    Thyroid disease    Vertigo     Past Surgical History:  Procedure Laterality Date   ABDOMINAL HYSTERECTOMY     BREAST BIOPSY  09/13/2022   MM RT RADIOACTIVE SEED LOC MAMMO GUIDE 09/13/2022 [facility]   BREAST BIOPSY  09/13/2022   MM RT RADIOACTIVE SEED EA ADD LESION LOC MAMMO GUIDE 09/13/2022 [facility]   BREAST LUMPECTOMY WITH RADIOACTIVE SEED AND SENTINEL LYMPH NODE BIOPSY Right 09/14/2022   Procedure: RIGHT BREAST LUMPECTOMY WITH RADIOACTIVE SEED;  Surgeon: Rolm Bookbinder, MD;  Location: [facility];  Service: General;  Laterality: Right;   Left breast cancer     20 years ago   PORT-A-CATH REMOVAL N/A 09/14/2022   Procedure: REMOVAL PORT-A-CATH;  Surgeon: Rolm Bookbinder, MD;  Location: [facility] OR;  Service: General;  Laterality: N/A;   PORTACATH PLACEMENT Right 06/19/2022   Procedure: PORT PLACEMENT WITH ULTRASOUND GUIDANCE;  Surgeon: Rolm Bookbinder, MD;  Location: [facility];  Service: General;  Laterality: Right;   RADIOACTIVE SEED GUIDED AXILLARY SENTINEL LYMPH NODE Right 09/14/2022   Procedure: Right AXILLARY SENTINEL LYMPH NODE;  Surgeon: Rolm Bookbinder, MD;  Location: [facility];  Service: General;  Laterality: Right;   RADIOACTIVE SEED GUIDED EXCISIONAL BREAST BIOPSY Right 09/14/2022   Procedure: RADIOACTIVE SEED GUIDED EXCISIONAL BREAST BIOPSY;  Surgeon: Rolm Bookbinder, MD;  Location: [facility];  Service: General;  Laterality: Right;   THYROIDECTOMY, PARTIAL      Family History  Problem Relation Age of Onset   Breast cancer Paternal Aunt    Lung cancer Paternal Uncle        he smoked   Lung  cancer Paternal Uncle        he smoked   Colon cancer Neg Hx    Pancreatic cancer Neg Hx    Esophageal cancer Neg Hx    Stomach cancer Neg Hx    Rectal cancer Neg Hx     Social History   Socioeconomic History   Marital status: Married    Spouse name: Not on file   Number of children: Not on file   Years of education: Not on file   Highest education level: Not on file  Occupational History   Not on file  Tobacco Use   Smoking status: Never   Smokeless tobacco: Never  Vaping Use   Vaping Use: Never used  Substance and Sexual Activity   Alcohol use: Yes    Comment: occasional wine   Drug use: Never   Sexual activity: Yes  Birth control/protection: Surgical    Comment: hyst  Other Topics Concern   Not on file  Social History Narrative   She is married, she has 1 son and 1 daughter they are twins and 4 grandchildren.      She is retired from [employer] having done many jobs there.      No alcohol 1 caffeinated beverage a day never smoker and no tobacco.   Social Determinants of Health   Financial Resource Strain: Not on file  Food Insecurity: No Food Insecurity (09/14/2022)   Hunger Vital Sign    Worried About Running Out of Food in the Last Year: Never true    Ran Out of Food in the Last Year: Never true  Transportation Needs: No Transportation Needs (09/14/2022)   PRAPARE - Hydrologist (Medical): No    Lack of Transportation (Non-Medical): No  Physical Activity: Not on file  Stress: Not on file  Social Connections: Not on file  Intimate Partner Violence: Not At Risk (09/14/2022)   Humiliation, Afraid, Rape, and Kick questionnaire    Fear of Current or Ex-Partner: No    Emotionally Abused: No    Physically Abused: No    Sexually Abused: No    ROS All review of systems negative except what is listed in the HPI      Objective    BP 127/68   Pulse 83   Temp 99.3 F (37.4 C)   Resp 16   Ht '5\' 9"'$  (1.753 m)    Wt 226 lb 9.6 oz (102.8 kg)   SpO2 100%   BMI 33.46 kg/m   Physical Exam Vitals reviewed.  Constitutional:      Appearance: Normal appearance.  Cardiovascular:     Rate and Rhythm: Normal rate and regular rhythm.     Pulses: Normal pulses.     Heart sounds: Normal heart sounds.  Pulmonary:     Effort: Pulmonary effort is normal.     Breath sounds: Normal breath sounds.  Skin:    General: Skin is warm and dry.     Comments: Steri strips to right chest incisions, no surrounding erythema/edema or drainage  Neurological:     Mental Status: She is alert and oriented to person, place, and time.  Psychiatric:        Mood and Affect: Mood normal.        Behavior: Behavior normal.        Thought Content: Thought content normal.        Judgment: Judgment normal.         Assessment & Plan:   Problem List Items Addressed This Visit       Cardiovascular and Mediastinum   Essential hypertension - Primary    Stable. Continue current meds: amlodipine 10 mg daily, hctz 25 mg daily, metoprolol XL 50 mg daily Continue lifestyle measures Declined labs today      Relevant Medications   metoprolol succinate (TOPROL-XL) 50 MG 24 hr tablet     Respiratory   Allergic rhinitis    Stable. No acute concerns. Refill Singulair.      Relevant Medications   montelukast (SINGULAIR) 10 MG tablet     Digestive   Esophageal reflux    Stable. No acute concerns. Refill Prilosec      Relevant Medications   omeprazole (PRILOSEC) 20 MG capsule     Other   Malignant neoplasm of upper-outer quadrant of right breast in female, estrogen receptor  negative ([place])    Patient in good spirits today. Reports good support system.  Scheduled for radiation consult in a few weeks.        Return for CPE at your convenience .   Terrilyn Saver, NP

## 2022-09-28 NOTE — Patient Instructions (Signed)
Thank you for choosing  Primary Care at [facility] for your Primary Care needs. I am excited for the opportunity to partner with you to meet your health care goals. It was a pleasure meeting you today!  Information on diet, exercise, and health maintenance recommendations are listed below. This is information to help you be sure you are on track for optimal health and monitoring.   Please look over this and let us know if you have any questions or if you have completed any of the health maintenance outside of [facility] so that we can be sure your records are up to date.  ___________________________________________________________  MyChart:  For all urgent or time sensitive needs we ask that you please call the office to avoid delays. Our number is [phone]. MyChart is not constantly monitored and due to the large volume of messages a day, replies may take up to 72 business hours.  MyChart Policy: MyChart allows for you to see your visit notes, after visit summary, provider recommendations, lab and tests results, make an appointment, request refills, and contact your provider or the office for non-urgent questions or concerns. Providers are seeing patients during normal business hours and do not have built in time to review MyChart messages.  We ask that you allow a minimum of 3 business days for responses to MyChart messages. For this reason, please do not send urgent requests through MyChart. Please call the office at [phone]. New and ongoing conditions may require a visit. We have virtual and in-person visits available for your convenience.  Complex MyChart concerns may require a visit. Your provider may request you schedule a virtual or in-person visit to ensure we are providing the best care possible. MyChart messages sent after 11:00 AM on Friday will not be received by the provider until Monday morning.    Lab and Test Results: You will receive your lab and test  results on MyChart as soon as they are completed and results have been sent by the lab or testing facility. Due to this service, you will receive your results BEFORE your provider.  I review lab and test results each morning prior to seeing patients. Some results require collaboration with other providers to ensure you are receiving the most appropriate care. For this reason, we ask that you please allow a minimum of 3-5 business days from the time that ALL results have been received for your provider to receive and review lab and test results and contact you about these.  Most lab and test result comments from the provider will be sent through MyChart. Your provider may recommend changes to the plan of care, follow-up visits, repeat testing, ask questions, or request an office visit to discuss these results. You may reply directly to this message or call the office to provide information for the provider or set up an appointment. In some instances, you will be called with test results and recommendations. Please let us know if this is preferred and we will make note of this in your chart to provide this for you.    If you have not heard a response to your lab or test results in 5 business days from all results returning to MyChart, please call the office to let us know. We ask that you please avoid calling prior to this time unless there is an emergent concern. Due to high call volumes, this can delay the resulting process.  After Hours: For all non-emergency after hours needs, please   call the office at [phone] and select the option to reach the on-call  service. On-call services are shared between multiple [facility] offices and therefore it will not be possible to speak directly with your provider. On-call providers may provide medical advice and recommendations, but are unable to provide refills for maintenance medications.  For all emergency or urgent medical needs after normal business hours, we  recommend that you seek care at the closest Urgent Care or Emergency Department to ensure appropriate treatment in a timely manner.  [facility] has a 24 hour emergency room located on the ground floor for your convenience.   Urgent Concerns During the Business Day Providers are seeing patients from 8AM to 5PM with a busy schedule and are most often not able to respond to non-urgent calls until the end of the day or the next business day. If you should have URGENT concerns during the day, please call and speak to the nurse or schedule a same day appointment so that we can address your concern without delay.   Thank you, again, for choosing me as your health care partner. I appreciate your trust and look forward to learning more about you!   Kosisochukwu Burningham B. Aslyn Cottman, DNP, FNP-C  ___________________________________________________________  Health Maintenance Recommendations Screening Testing Mammogram Every 1-2 years based on history and risk factors Starting at age 50 Pap Smear Ages 21-39 every 3 years Ages 30-65 every 5 years with HPV testing More frequent testing may be required based on results and history Colon Cancer Screening Every 1-10 years based on test performed, risk factors, and history Starting at age 45 Bone Density Screening Every 2-10 years based on history Starting at age 65 for women Recommendations for men differ based on medication usage, history, and risk factors AAA Screening One time ultrasound Men 65-75 years old who have ever smoked Lung Cancer Screening Low Dose Lung CT every 12 months Age 50-80 years with a 20 pack-year smoking history who still smoke or who have quit within the last 15 years  Screening Labs Routine  Labs: Complete Blood Count (CBC), Complete Metabolic Panel (CMP), Cholesterol (Lipid Panel) Every 6-12 months based on history and medications May be recommended more frequently based on current conditions or previous results Hemoglobin  A1c Lab Every 3-12 months based on history and previous results Starting at age 45 or earlier with diagnosis of diabetes, high cholesterol, BMI >26, and/or risk factors Frequent monitoring for patients with diabetes to ensure blood sugar control Thyroid Panel  Every 6 months based on history, symptoms, and risk factors May be repeated more often if on medication HIV One time testing for all patients 13 and older May be repeated more frequently for patients with increased risk factors or exposure Hepatitis C One time testing for all patients 18 and older May be repeated more frequently for patients with increased risk factors or exposure Gonorrhea, Chlamydia Every 12 months for all sexually active persons 13-24 years Additional monitoring may be recommended for those who are considered high risk or who have symptoms PSA Men 40-54 years old with risk factors Additional screening may be recommended from age 55-69 based on risk factors, symptoms, and history  Vaccine Recommendations Tetanus Booster All adults every 10 years Flu Vaccine All patients 6 months and older every year COVID Vaccine All patients 12 years and older Initial dosing with booster May recommend additional booster based on age and health history HPV Vaccine 2 doses all patients age 9-26 Dosing may be considered   for patients over 26 Shingles Vaccine (Shingrix) 2 doses all adults 50 years and older Pneumonia (Pneumovax 23) All adults 65 years and older May recommend earlier dosing based on health history Pneumonia (Prevnar 13) All adults 65 years and older Dosed 1 year after Pneumovax 23 Pneumonia (Prevnar 20) All adults 65 years and older (adults 19-64 with certain conditions or risk factors) 1 dose  For those who have not received Prevnar 13 vaccine previously   Additional Screening, Testing, and Vaccinations may be recommended on an individualized basis based on family history, health history, risk  factors, and/or exposure.  __________________________________________________________  Diet Recommendations for All Patients  I recommend that all patients maintain a diet low in saturated fats, carbohydrates, and cholesterol. While this can be challenging at first, it is not impossible and small changes can make big differences.  Things to try: Decreasing the amount of soda, sweet tea, and/or juice to one or less per day and replace with water While water is always the first choice, if you do not like water you may consider adding a water additive without sugar to improve the taste other sugar free drinks Replace potatoes with a brightly colored vegetable  Use healthy oils, such as canola oil or olive oil, instead of butter or hard margarine Limit your bread intake to two pieces or less a day Replace regular pasta with low carb pasta options Bake, broil, or grill foods instead of frying Monitor portion sizes  Eat smaller, more frequent meals throughout the day instead of large meals  An important thing to remember is, if you love foods that are not great for your health, you don't have to give them up completely. Instead, allow these foods to be a reward when you have done well. Allowing yourself to still have special treats every once in a while is a nice way to tell yourself thank you for working hard to keep yourself healthy.   Also remember that every day is a new day. If you have a bad day and "fall off the wagon", you can still climb right back up and keep moving along on your journey!  We have resources available to help you!  Some websites that may be helpful include: www.MyPlate.gov  Www.VeryWellFit.com _____________________________________________________________  Activity Recommendations for All Patients  I recommend that all adults get at least 20 minutes of moderate physical activity that elevates your heart rate at least 5 days out of the week.  Some examples  include: Walking or jogging at a pace that allows you to carry on a conversation Cycling (stationary bike or outdoors) Water aerobics Yoga Weight lifting Dancing If physical limitations prevent you from putting stress on your joints, exercise in a pool or seated in a chair are excellent options.  Do determine your MAXIMUM heart rate for activity: 220 - YOUR AGE = MAX Heart Rate   Remember! Do not push yourself too hard.  Start slowly and build up your pace, speed, weight, time in exercise, etc.  Allow your body to rest between exercise and get good sleep. You will need more water than normal when you are exerting yourself. Do not wait until you are thirsty to drink. Drink with a purpose of getting in at least 8, 8 ounce glasses of water a day plus more depending on how much you exercise and sweat.    If you begin to develop dizziness, chest pain, abdominal pain, jaw pain, shortness of breath, headache, vision changes, lightheadedness, or other concerning symptoms,   stop the activity and allow your body to rest. If your symptoms are severe, seek emergency evaluation immediately. If your symptoms are concerning, but not severe, please let us know so that we can recommend further evaluation.     

## 2022-10-02 ENCOUNTER — Telehealth: Payer: Self-pay | Admitting: Hematology and Oncology

## 2022-10-02 NOTE — Telephone Encounter (Signed)
Scheduled appointment per los. Patient is aware.

## 2022-10-03 ENCOUNTER — Other Ambulatory Visit: Payer: Self-pay

## 2022-10-05 NOTE — Evaluation (Deleted)
OUTPATIENT PHYSICAL THERAPY BREAST CANCER POST OP FOLLOW UP   Patient Name: Karina Ferguson MRN: [id number] DOB:1956-01-08, 67 y.o., female Today's Date: 10/06/2022  END OF SESSION:  PT End of Session - 10/06/22 1111     Visit Number 2    Number of Visits 2    Date for PT Re-Evaluation 10/06/22    PT Start Time 1112    PT Stop Time 1200    PT Time Calculation (min) 48 min    Activity Tolerance Patient tolerated treatment well    Behavior During Therapy [facility] for tasks assessed/performed             Past Medical History:  Diagnosis Date   Allergic rhinitis    Allergy    Anemia    Anxiety    Arthritis    Breast cancer ([place]) 06/05/2022   Cancer ([place]) 04/2022   right breast IDC/DCIS   Chronic idiopathic constipation    GERD (gastroesophageal reflux disease)    Hyperlipidemia    Hypertension    Insomnia    Sleep apnea    wears CPAP nightly   Stage 3 chronic kidney disease ([place])    Thyroid disease    Vertigo    Past Surgical History:  Procedure Laterality Date   ABDOMINAL HYSTERECTOMY     BREAST BIOPSY  09/13/2022   MM RT RADIOACTIVE SEED LOC MAMMO GUIDE 09/13/2022 [facility]   BREAST BIOPSY  09/13/2022   MM RT RADIOACTIVE SEED EA ADD LESION LOC MAMMO GUIDE 09/13/2022 [facility]   BREAST LUMPECTOMY WITH RADIOACTIVE SEED AND SENTINEL LYMPH NODE BIOPSY Right 09/14/2022   Procedure: RIGHT BREAST LUMPECTOMY WITH RADIOACTIVE SEED;  Surgeon: Rolm Bookbinder, MD;  Location: [place];  Service: General;  Laterality: Right;   Left breast cancer     20 years ago   PORT-A-CATH REMOVAL N/A 09/14/2022   Procedure: REMOVAL PORT-A-CATH;  Surgeon: Rolm Bookbinder, MD;  Location: [place];  Service: General;  Laterality: N/A;   PORTACATH PLACEMENT Right 06/19/2022   Procedure: PORT PLACEMENT WITH ULTRASOUND GUIDANCE;  Surgeon: Rolm Bookbinder, MD;  Location: [place];  Service: General;  Laterality: Right;   RADIOACTIVE SEED GUIDED AXILLARY SENTINEL LYMPH  NODE Right 09/14/2022   Procedure: Right AXILLARY SENTINEL LYMPH NODE;  Surgeon: Rolm Bookbinder, MD;  Location: [place];  Service: General;  Laterality: Right;   RADIOACTIVE SEED GUIDED EXCISIONAL BREAST BIOPSY Right 09/14/2022   Procedure: RADIOACTIVE SEED GUIDED EXCISIONAL BREAST BIOPSY;  Surgeon: Rolm Bookbinder, MD;  Location: [place];  Service: General;  Laterality: Right;   THYROIDECTOMY, PARTIAL     Patient Active Problem List   Diagnosis Date Noted   Status post right breast lumpectomy 09/14/2022   Port-A-Cath in place 07/27/2022   Genetic testing 07/14/2022   Malignant neoplasm of upper-outer quadrant of right breast in female, estrogen receptor negative ([place]) 06/15/2022   Moderate obstructive sleep apnea 02/17/2022   Snoring 10/04/2021   Allergic rhinitis 10/04/2021   Acute bacterial rhinosinusitis 08/31/2021   Upper airway cough syndrome 08/31/2021   Irritable bowel syndrome 04/11/2021   Impaired fasting glucose 02/12/2020   Keratoconjunctivitis sicca of both eyes not specified as Sjogren's 01/19/2020   Nuclear sclerotic cataract of both eyes 01/19/2020   Posterior vitreous detachment of both eyes 01/19/2020   Class 2 obesity 11/06/2017   Constipation 10/03/2015   Esophageal reflux 10/03/2015   Essential hypertension 10/03/2015   History of breast cancer 10/03/2015   History of IBS 10/03/2015   History of orthostatic  hypotension 10/03/2015   Hyperlipemia 10/03/2015   Insomnia 10/03/2015   Pain, joint, multiple sites 10/03/2015   S/P partial thyroidectomy 10/03/2015   Situational anxiety 10/03/2015   Stage 3 chronic kidney disease ([place]) 10/03/2015   Left lumbar radiculopathy 06/30/2014   Neurogenic claudication 06/30/2014   Other intervertebral disc degeneration, lumbar region 06/30/2014    PCP: Doug Sou, DO  REFERRING PROVIDER: Rolm Bookbinder, MD  REFERRING DIAG: Right Breast Cancer  THERAPY DIAG:  Malignant neoplasm of upper-outer quadrant of right  female breast, unspecified estrogen receptor status ([place])  Abnormal posture  Rationale for Evaluation and Treatment: Rehabilitation  ONSET DATE: 06/05/2022  SUBJECTIVE:                                                                                                                                                                                           SUBJECTIVE STATEMENT: Surgery went well but the nerve block caused a pneumothorax. My stomach is hurting today and I don't feel great. I have been doing my exercises. 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 It measures 1.9 cm and is located in the upper-outer quadrant. 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 She will be having neoadjuvant chemotherapy starting next week. She had a routine mammogram which was negative, but requested an MRI because of C density breasts. She had a prior left Lumpectomy for DCIS in 2004 . On 09/14/2022 she had a right Lumpectomy with SLNB with 0/1 LN. She will have radiation consult Feb. 14,2024  PATIENT GOALS:  Reassess how my recovery is going related to arm function, pain, and swelling.  PAIN:  Are you having pain? No  PRECAUTIONS: Recent Surgery, right UE Lymphedema risk,   ACTIVITY LEVEL / LEISURE: walking on treadmill some   OBJECTIVE:   PATIENT SURVEYS:  QUICK DASH: 31.82  OBSERVATIONS: Axillary, and 3 breast incisions still covered with steri strips; Advised pt to let water run on her in the shower to get steri strips off. Right breast is visibly larger and pt notes it always has been.  Appears slightly larger laterally  POSTURE:  Forward head, rounded shoulders  LYMPHEDEMA ASSESSMENT:    UPPER EXTREMITY AROM/PROM:   A/PROM RIGHT   eval 06/22/2022 Right 10/06/2022  Shoulder extension 40 45  Shoulder flexion 145 140  Shoulder abduction 164 160  Shoulder internal rotation 68 65  Shoulder  external rotation 105 105                          (  Blank rows = not tested)   A/PROM LEFT   Eval 06/22/2022  Shoulder extension 48  Shoulder flexion 147  Shoulder abduction 157  Shoulder internal rotation 68  Shoulder external rotation 102                          (Blank rows = not tested)     CERVICAL AROM: All within functional limits: limited slightly by new portacath         UPPER EXTREMITY STRENGTH: WFL   LYMPHEDEMA ASSESSMENTS:    LANDMARK RIGHT   eval RIGHT 10/06/2022  10 cm proximal to olecranon process 37.5 36.5  Olecranon process 31.7 31.3  10 cm proximal to ulnar styloid process 27.3 26.5  Just proximal to ulnar styloid process 19.9 19.4  Across hand at thumb web space 21.7 21.0  At base of 2nd digit 7.0 6.95  (Blank rows = not tested)   LANDMARK LEFT   eval  10 cm proximal to olecranon process 37.2  Olecranon process 31.0  10 cm proximal to ulnar styloid process 25.0  Just proximal to ulnar styloid process 19.3  Across hand at thumb web space 20.4  At base of 2nd digit 6.9  (Blank rows = not  Surgery type/Date: 2004 Left Lumpectomy for DCIS, 09/14/2022 Right Lumpectomy with SLNB Number of lymph nodes removed: 0/1 Current/past treatment (chemo, radiation, hormone therapy): Neoadjuvant chemo, pending radiation Other symptoms:  Heaviness/tightness No Pain No Pitting edema No Infections No Decreased scar mobility Yes Stemmer sign No  PATIENT EDUCATION:  Education details: ABC, SOZO, continue exercises, continue compression bra throughout radiation and exercises to be sure you don't get tight, Scar Massage, Posture, Person educated: Patient Education method: Explanation, Demonstration, and Handouts Education comprehension: verbalized understanding and returned demonstration  HOME EXERCISE PROGRAM: Reviewed previously given post op HEP.   ASSESSMENT:  CLINICAL IMPRESSION: Pt is s/p Neoadjuvant chemotherapy for Triple Negative Cancer, right  Lumpectomy with SLNB, and she is pending radiation. She has been compliant with her HEP and is lacking no more than 5 degrees of ROM  in any direction. We reviewed exercises and pt understands it would benefit her to continue the exercises now and throughout radiation and beyond. It was also suggested she wear her compression bra. She was educated in scar massage, importance of walking, SOZO screens and ABC class.  Foam pads were made for her to place in her compression bra for comfort in the axillary and lower band area.There are no further PT needs identified today, but pt knows to call or message with any questions or concerns  Pt will benefit from skilled therapeutic intervention to improve on the following deficits: Decreased knowledge of precautions, impaired UE functional use, pain, decreased ROM, postural dysfunction.   PT treatment/interventions: ADL/Self care home management, Therapeutic exercises, Patient/Family education, Self Care, scar mobilization, and Re-evaluation   GOALS: Goals reviewed with patient? Yes  LONG TERM GOALS:  (STG=LTG)  GOALS Name Target Date  Goal status  1 Pt will demonstrate she has regained full shoulder ROM and function post operatively compared to baselines.  Baseline: 10/06/2022 MET within 5 degrees or less of all  2     3     4        $ PLAN:  PT FREQUENCY/DURATION: No further appts scheduled  PLAN FOR NEXT SESSION: No further needs identified at this time, but pt knows to call with questions or concerns.   [facility]  [phone]  [address], [address]  [place] [address]  [phone]  After Breast Cancer Class It is recommended you attend the ABC class to be educated on lymphedema risk reduction. This class is free of charge and lasts for 1 hour. It is a 1-time class. You will need to download the Webex app either on your phone or computer. We will send you a link the night before or the morning of the class. You should be able  to click on that link to join the class. This is not a confidential class. You don't have to turn your camera on, but other participants may be able to see your email address.  Scar massage You can begin gentle scar massage to you incision sites. Gently place one hand on the incision and move the skin (without sliding on the skin) in various directions. Do this for a few minutes and then you can gently massage either coconut oil or vitamin E cream into the scars.  Compression garment You should continue wearing your compression bra until you feel like you no longer have swelling.  Home exercise Program Continue doing the exercises you were given until you feel like you can do them without feeling any tightness at the end.   Walking Program Studies show that 30 minutes of walking per day (fast enough to elevate your heart rate) can significantly reduce the risk of a cancer recurrence. If you can't walk due to other medical reasons, we encourage you to find another activity you could do (like a stationary bike or water exercise).  Posture After breast cancer surgery, people frequently sit with rounded shoulders posture because it puts their incisions on slack and feels better. If you sit like this and scar tissue forms in that position, you can become very tight and have pain sitting or standing with good posture. Try to be aware of your posture and sit and stand up tall to heal properly.  Follow up PT: It is recommended you return every 3 months for the first 3 years following surgery to be assessed on the SOZO machine for an L-Dex score. This helps prevent clinically significant lymphedema in 95% of patients. These follow up screens are 10 minute appointments that you are not billed for.  Claris Pong, PT 10/06/2022, 12:15 PM

## 2022-10-06 ENCOUNTER — Ambulatory Visit: Payer: Medicare Other | Attending: General Surgery

## 2022-10-06 DIAGNOSIS — C50411 Malignant neoplasm of upper-outer quadrant of right female breast: Secondary | ICD-10-CM | POA: Insufficient documentation

## 2022-10-06 DIAGNOSIS — R293 Abnormal posture: Secondary | ICD-10-CM | POA: Diagnosis present

## 2022-10-06 NOTE — Patient Instructions (Addendum)
     [facility]  [address], [address]  [place] [address]  [phone]  After Breast Cancer Class: Monday Feb 19, 12:00 It is recommended you attend the ABC class to be educated on lymphedema risk reduction. This class is free of charge and lasts for 1 hour. It is a 1-time class. You will need to download the Webex app either on your phone or computer. We will send you a link the night before or the morning of the class. You should be able to click on that link to join the class. This is not a confidential class. You don't have to turn your camera on, but other participants may be able to see your email address.  Scar massage You can begin gentle scar massage to you incision sites. Gently place one hand on the incision and move the skin (without sliding on the skin) in various directions. Do this for a few minutes and then you can gently massage either coconut oil or vitamin E cream into the scars.  Compression garment You should continue wearing your compression bra until you feel like you no longer have swelling.  Home exercise Program Continue doing the exercises you were given until you feel like you can do them without feeling any tightness at the end.   Walking Program Studies show that 30 minutes of walking per day (fast enough to elevate your heart rate) can significantly reduce the risk of a cancer recurrence. If you can't walk due to other medical reasons, we encourage you to find another activity you could do (like a stationary bike or water exercise).  Posture After breast cancer surgery, people frequently sit with rounded shoulders posture because it puts their incisions on slack and feels better. If you sit like this and scar tissue forms in that position, you can become very tight and have pain sitting or standing with good posture. Try to be aware of your posture and sit and stand up tall to heal properly.  Follow up PT: It is recommended you  return every 3 months for the first 2 years following surgery to be assessed on the SOZO machine for an L-Dex score. This helps prevent clinically significant lymphedema in 95% of patients. These follow up screens are 10 minute appointments that you are not billed for.

## 2022-10-06 NOTE — Therapy (Signed)
OUTPATIENT PHYSICAL THERAPY BREAST CANCER POST OP FOLLOW UP   Patient Name: Karina Ferguson MRN: [id number] DOB:10-28-1955, 67 y.o., female Today's Date: 10/06/2022  END OF SESSION:  PT End of Session - 10/06/22 1111     Visit Number 2    Number of Visits 2    Date for PT Re-Evaluation 10/06/22    PT Start Time 1112    PT Stop Time 1200    PT Time Calculation (min) 48 min    Activity Tolerance Patient tolerated treatment well    Behavior During Therapy [facility] for tasks assessed/performed             Past Medical History:  Diagnosis Date   Allergic rhinitis    Allergy    Anemia    Anxiety    Arthritis    Breast cancer ([place]) 06/05/2022   Cancer ([place]) 04/2022   right breast IDC/DCIS   Chronic idiopathic constipation    GERD (gastroesophageal reflux disease)    Hyperlipidemia    Hypertension    Insomnia    Sleep apnea    wears CPAP nightly   Stage 3 chronic kidney disease ([place])    Thyroid disease    Vertigo    Past Surgical History:  Procedure Laterality Date   ABDOMINAL HYSTERECTOMY     BREAST BIOPSY  09/13/2022   MM RT RADIOACTIVE SEED LOC MAMMO GUIDE 09/13/2022 [facility]   BREAST BIOPSY  09/13/2022   MM RT RADIOACTIVE SEED EA ADD LESION LOC MAMMO GUIDE 09/13/2022 [facility]MO[facility]   BREAST LUMPECTOMY WITH RADIOACTIVE SEED AND SENTINEL LYMPH NODE BIOPSY Right 09/14/2022   Procedure: RIGHT BREAST LUMPECTOMY WITH RADIOACTIVE SEED;  Surgeon: Rolm Bookbinder, MD;  Location: [place];  Service: General;  Laterality: Right;   Left breast cancer     20 years ago   PORT-A-CATH REMOVAL N/A 09/14/2022   Procedure: REMOVAL PORT-A-CATH;  Surgeon: Rolm Bookbinder, MD;  Location: [place];  Service: General;  Laterality: N/A;   PORTACATH PLACEMENT Right 06/19/2022   Procedure: PORT PLACEMENT WITH ULTRASOUND GUIDANCE;  Surgeon: Rolm Bookbinder, MD;  Location: [place];  Service: General;  Laterality: Right;   RADIOACTIVE SEED GUIDED AXILLARY SENTINEL LYMPH  NODE Right 09/14/2022   Procedure: Right AXILLARY SENTINEL LYMPH NODE;  Surgeon: Rolm Bookbinder, MD;  Location: [place];  Service: General;  Laterality: Right;   RADIOACTIVE SEED GUIDED EXCISIONAL BREAST BIOPSY Right 09/14/2022   Procedure: RADIOACTIVE SEED GUIDED EXCISIONAL BREAST BIOPSY;  Surgeon: Rolm Bookbinder, MD;  Location: [place];  Service: General;  Laterality: Right;   THYROIDECTOMY, PARTIAL     Patient Active Problem List   Diagnosis Date Noted   Status post right breast lumpectomy 09/14/2022   Port-A-Cath in place 07/27/2022   Genetic testing 07/14/2022   Malignant neoplasm of upper-outer quadrant of right breast in female, estrogen receptor negative ([place]) 06/15/2022   Moderate obstructive sleep apnea 02/17/2022   Snoring 10/04/2021   Allergic rhinitis 10/04/2021   Acute bacterial rhinosinusitis 08/31/2021   Upper airway cough syndrome 08/31/2021   Irritable bowel syndrome 04/11/2021   Impaired fasting glucose 02/12/2020   Keratoconjunctivitis sicca of both eyes not specified as Sjogren's 01/19/2020   Nuclear sclerotic cataract of both eyes 01/19/2020   Posterior vitreous detachment of both eyes 01/19/2020   Class 2 obesity 11/06/2017   Constipation 10/03/2015   Esophageal reflux 10/03/2015   Essential hypertension 10/03/2015   History of breast cancer 10/03/2015   History of IBS 10/03/2015   History of orthostatic  hypotension 10/03/2015   Hyperlipemia 10/03/2015   Insomnia 10/03/2015   Pain, joint, multiple sites 10/03/2015   S/P partial thyroidectomy 10/03/2015   Situational anxiety 10/03/2015   Stage 3 chronic kidney disease ([place]) 10/03/2015   Left lumbar radiculopathy 06/30/2014   Neurogenic claudication 06/30/2014   Other intervertebral disc degeneration, lumbar region 06/30/2014      PCP: Doug Sou, DO   REFERRING PROVIDER: Rolm Bookbinder, MD   REFERRING DIAG: Right Breast Cancer   THERAPY DIAG:  Malignant neoplasm of upper-outer quadrant of  right female breast, unspecified estrogen receptor status ([place])   Abnormal posture   Rationale for Evaluation and Treatment: Rehabilitation   ONSET DATE: 06/05/2022   SUBJECTIVE:                                                                                                                                                                                            SUBJECTIVE STATEMENT: Surgery went well but the nerve block caused a pneumothorax. My stomach is hurting today and I don't feel great. I have been doing my exercises. 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 It measures 1.9 cm and is located in the upper-outer quadrant. 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 She will be having neoadjuvant chemotherapy starting next week. She had a routine mammogram which was negative, but requested an MRI because of C density breasts. She had a prior left Lumpectomy for DCIS in 2004 . On 09/14/2022 she had a right Lumpectomy with SLNB with 0/1 LN. She will have radiation consult Feb. 14,2024   PATIENT GOALS:  Reassess how my recovery is going related to arm function, pain, and swelling.   PAIN:  Are you having pain? No   PRECAUTIONS: Recent Surgery, right UE Lymphedema risk,    ACTIVITY LEVEL / LEISURE: walking on treadmill some     OBJECTIVE:    PATIENT SURVEYS:  QUICK DASH: 31.82   OBSERVATIONS: Axillary, and 3 breast incisions still covered with steri strips; Advised pt to let water run on her in the shower to get steri strips off. Right breast is visibly larger and pt notes it always has been.  Appears slightly larger laterally   POSTURE:  Forward head, rounded shoulders   LYMPHEDEMA ASSESSMENT:    UPPER EXTREMITY AROM/PROM:   A/PROM RIGHT   eval 06/22/2022 Right 10/06/2022  Shoulder extension 40 45  Shoulder flexion 145 140  Shoulder abduction 164 160  Shoulder internal  rotation 68 65  Shoulder external rotation  105 105                          (Blank rows = not tested)   A/PROM LEFT   Eval 06/22/2022  Shoulder extension 48  Shoulder flexion 147  Shoulder abduction 157  Shoulder internal rotation 68  Shoulder external rotation 102                          (Blank rows = not tested)     CERVICAL AROM: All within functional limits: limited slightly by new portacath         UPPER EXTREMITY STRENGTH: WFL   LYMPHEDEMA ASSESSMENTS:    LANDMARK RIGHT   eval RIGHT 10/06/2022  10 cm proximal to olecranon process 37.5 36.5  Olecranon process 31.7 31.3  10 cm proximal to ulnar styloid process 27.3 26.5  Just proximal to ulnar styloid process 19.9 19.4  Across hand at thumb web space 21.7 21.0  At base of 2nd digit 7.0 6.95  (Blank rows = not tested)   LANDMARK LEFT   eval  10 cm proximal to olecranon process 37.2  Olecranon process 31.0  10 cm proximal to ulnar styloid process 25.0  Just proximal to ulnar styloid process 19.3  Across hand at thumb web space 20.4  At base of 2nd digit 6.9  (Blank rows = not   Surgery type/Date: 2004 Left Lumpectomy for DCIS, 09/14/2022 Right Lumpectomy with SLNB Number of lymph nodes removed: 0/1 Current/past treatment (chemo, radiation, hormone therapy): Neoadjuvant chemo, pending radiation Other symptoms:  Heaviness/tightness No Pain No Pitting edema No Infections No Decreased scar mobility Yes Stemmer sign No   PATIENT EDUCATION:  Education details: ABC, SOZO, continue exercises, continue compression bra throughout radiation and exercises to be sure you don't get tight, Scar Massage, Posture, Person educated: Patient Education method: Explanation, Demonstration, and Handouts Education comprehension: verbalized understanding and returned demonstration   HOME EXERCISE PROGRAM: Reviewed previously given post op HEP.     ASSESSMENT:   CLINICAL IMPRESSION: Pt is s/p Neoadjuvant chemotherapy for  Triple Negative Cancer, right Lumpectomy with SLNB, and she is pending radiation. She has been compliant with her HEP and is lacking no more than 5 degrees of ROM  in any direction. We reviewed exercises and pt understands it would benefit her to continue the exercises now and throughout radiation and beyond. It was also suggested she wear her compression bra. She was educated in scar massage, importance of walking, SOZO screens and ABC class.  Foam pads were made for her to place in her compression bra for comfort in the axillary and lower band area.There are no further PT needs identified today, but pt knows to call or message with any questions or concerns   Pt will benefit from skilled therapeutic intervention to improve on the following deficits: Decreased knowledge of precautions, impaired UE functional use, pain, decreased ROM, postural dysfunction.    PT treatment/interventions: ADL/Self care home management, Therapeutic exercises, Patient/Family education, Self Care, scar mobilization, and Re-evaluation     GOALS: Goals reviewed with patient? Yes   LONG TERM GOALS:  (STG=LTG)   GOALS Name Target Date   Goal status  1 Pt will demonstrate she has regained full shoulder ROM and function post operatively compared to baselines.  Baseline: 10/06/2022 MET within 5 degrees or less of all  2        3  4              PLAN:   PT FREQUENCY/DURATION: No further appts scheduled   PLAN FOR NEXT SESSION: No further needs identified at this time, but pt knows to call with questions or concerns.

## 2022-10-09 NOTE — Progress Notes (Incomplete)
Radiation Oncology         [phone]) [phone] ________________________________  Name: Karina Ferguson        MRN: [id number]  Date of Service: 10/11/2022 DOB: September 23, 1955  [id number], Purcell Nails, NP  Nicholas Lose, MD     REFERRING PHYSICIAN: Nicholas Lose, MD   DIAGNOSIS: The encounter diagnosis was Malignant neoplasm of upper-outer quadrant of right breast in female, estrogen receptor negative ([place]).   HISTORY OF PRESENT ILLNESS: Karina Ferguson is a 67 y.o. female with a history of right breast cancer.  Of note, the patient was noted to have a history of left breast treated in 2004 with left lumpectomy, Mammosite partial breast irradiation, and antiestrogen therapy.   In March 2023 she had a normal screening mammogram at [place], [facility], but had an MRI to follow-up with [place] in September 2023 which showed linear enhancement in the 12 o'clock position of the right breast and an indeterminate mass in the lateral periareolar region of the right breast.  No abnormal enhancement in the left breast measuring 1.9 cm was identified.  It was concluded that no evidence of adenopathy in the axilla was identified.  She relocated her care to the [facility] system and on 06/05/2022 underwent 2 biopsies.  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  The cancer was triple negative with a KIA 67 of 60%.  The second right breast biopsy showed fibrocystic change negative for malignancy.  Given these findings she was treated with neoadjuvant chemotherapy which she completed on 08/12/2022.  Interval MRI of the breast on 08/16/2022 showed interval decrease in the biopsy-proven malignancy now measuring 7 mm and slight decrease in the biopsied lesion in the retroareolar right breast.  No adenopathy was appreciated.    Since her last visit she underwent a right lumpectomy with sentinel lymph node biopsy on 09/14/2022 with Dr. Donne Hazel.  Final  pathology showed grade 3 invasive ductal carcinoma measuring 2.5 mm in greatest dimension with focal residual high-grade DCIS with comedonecrosis was seen as well.  Her margins were negative for invasive and in situ disease, 1 sentinel node was sampled and negative for metastatic disease.  She is seen to discuss proceeding with adjuvant radiotherapy.   PREVIOUS RADIATION THERAPY:   2004: The left breast was treated with Mammosite, partial breast irradiaiton at [place]:  Past Medical History:  Diagnosis Date   Allergic rhinitis    Allergy    Anemia    Anxiety    Arthritis    Breast cancer ([place]) 06/05/2022   Cancer ([place]) 04/2022   right breast IDC/DCIS   Chronic idiopathic constipation    GERD (gastroesophageal reflux disease)    Hyperlipidemia    Hypertension    Insomnia    Sleep apnea    wears CPAP nightly   Stage 3 chronic kidney disease ([place])    Thyroid disease    Vertigo        PAST SURGICAL HISTORY: Past Surgical History:  Procedure Laterality Date   ABDOMINAL HYSTERECTOMY     BREAST BIOPSY  09/13/2022   MM RT RADIOACTIVE SEED LOC MAMMO GUIDE 09/13/2022 [facility]   BREAST BIOPSY  09/13/2022   MM RT RADIOACTIVE SEED EA ADD LESION LOC MAMMO GUIDE 09/13/2022 [facility]   BREAST LUMPECTOMY WITH RADIOACTIVE SEED AND SENTINEL LYMPH NODE BIOPSY Right 09/14/2022   Procedure: RIGHT BREAST LUMPECTOMY WITH RADIOACTIVE SEED;  Surgeon: Rolm Bookbinder, MD;  Location: [place];  Service: General;  Laterality: Right;   Left breast cancer     20 years ago   PORT-A-CATH REMOVAL N/A 09/14/2022   Procedure: REMOVAL PORT-A-CATH;  Surgeon: Rolm Bookbinder, MD;  Location: [place];  Service: General;  Laterality: N/A;   PORTACATH PLACEMENT Right 06/19/2022   Procedure: PORT PLACEMENT WITH ULTRASOUND GUIDANCE;  Surgeon: Rolm Bookbinder, MD;  Location: [place];  Service: General;  Laterality: Right;   RADIOACTIVE SEED  GUIDED AXILLARY SENTINEL LYMPH NODE Right 09/14/2022   Procedure: Right AXILLARY SENTINEL LYMPH NODE;  Surgeon: Rolm Bookbinder, MD;  Location: [place];  Service: General;  Laterality: Right;   RADIOACTIVE SEED GUIDED EXCISIONAL BREAST BIOPSY Right 09/14/2022   Procedure: RADIOACTIVE SEED GUIDED EXCISIONAL BREAST BIOPSY;  Surgeon: Rolm Bookbinder, MD;  Location: [place];  Service: General;  Laterality: Right;   THYROIDECTOMY, PARTIAL       FAMILY HISTORY:  Family History  Problem Relation Age of Onset   Breast cancer Paternal Aunt    Lung cancer Paternal Uncle        he smoked   Lung cancer Paternal Uncle        he smoked   Colon cancer Neg Hx    Pancreatic cancer Neg Hx    Esophageal cancer Neg Hx    Stomach cancer Neg Hx    Rectal cancer Neg Hx      SOCIAL HISTORY:  reports that she has never smoked. She has never used smokeless tobacco. She reports current alcohol use. She reports that she does not use drugs. She is active and is retired from working for a [employer].   ALLERGIES: Amoxicillin, Esomeprazole magnesium, and Rabeprazole   MEDICATIONS:  Current Outpatient Medications  Medication Sig Dispense Refill   amLODipine (NORVASC) 10 MG tablet Take 10 mg by mouth daily.     gabapentin (NEURONTIN) 100 MG capsule Take 100 mg by mouth daily as needed (pain). PRN for back pain     hydrochlorothiazide (HYDRODIURIL) 25 MG tablet Take 25 mg by mouth daily.     metoprolol succinate (TOPROL-XL) 50 MG 24 hr tablet Take 1 tablet (50 mg total) by mouth daily. 90 tablet 1   montelukast (SINGULAIR) 10 MG tablet Take 1 tablet (10 mg total) by mouth daily. 90 tablet 1   Multiple Vitamin (MULTI-VITAMIN) tablet Take 1 tablet by mouth daily.     Omega-3 Fatty Acids (FISH OIL) 1000 MG CAPS Take 1,000 mg by mouth daily.     omeprazole (PRILOSEC) 20 MG capsule Take 1 capsule (20 mg total) by mouth daily. 90 capsule 1   vitamin B-12 (CYANOCOBALAMIN) 500 MCG tablet Take 500 mcg by mouth  daily.     No current facility-administered medications for this visit.     REVIEW OF SYSTEMS: On review of systems, the patient reports that she is doing ***     PHYSICAL EXAM:  Wt Readings from Last 3 Encounters:  09/28/22 226 lb 9.6 oz (102.8 kg)  09/28/22 226 lb (102.5 kg)  09/14/22 227 lb (103 kg)   Temp Readings from Last 3 Encounters:  09/28/22 99.3 F (37.4 C)  09/28/22 98.6 F (37 C) (Temporal)  09/17/22 98.8 F (37.1 C) (Oral)   BP Readings from Last 3 Encounters:  09/28/22 127/68  09/28/22 (!) 146/92  09/17/22 (!) 163/95   Pulse Readings from Last 3 Encounters:  09/28/22 83  09/28/22 85  09/17/22 (!) 104    /10  In general this is a well appearing African-American female in no acute distress.  She's alert and oriented x4 and appropriate throughout the examination. Cardiopulmonary assessment is negative for acute distress and she exhibits normal effort.  Her right breast reveals a well-healed surgical incision site as does her right axilla.  Neither incision appear to have evidence of erythema, separation or drainage.   ECOG = ***  0 - Asymptomatic (Fully active, able to carry on all predisease activities without restriction)  1 - Symptomatic but completely ambulatory (Restricted in physically strenuous activity but ambulatory and able to carry out work of a light or sedentary nature. For example, light housework, office work)  2 - Symptomatic, <50% in bed during the day (Ambulatory and capable of all self care but unable to carry out any work activities. Up and about more than 50% of waking hours)  3 - Symptomatic, >50% in bed, but not bedbound (Capable of only limited self-care, confined to bed or chair 50% or more of waking hours)  4 - Bedbound (Completely disabled. Cannot carry on any self-care. Totally confined to bed or chair)  5 - Death   Eustace Pen MM, Creech RH, Tormey DC, et al. [phone]). "Toxicity and response criteria of the [facility]". [place] Oncol. 5 (6): 649-55    LABORATORY DATA:  Lab Results  Component Value Date   WBC 6.4 09/08/2022   HGB 11.7 (L) 09/08/2022   HCT 35.6 (L) 09/08/2022   MCV 94.2 09/08/2022   PLT 243 09/08/2022   Lab Results  Component Value Date   NA 136 09/08/2022   K 3.8 09/08/2022   CL 102 09/08/2022   CO2 25 09/08/2022   Lab Results  Component Value Date   ALT 16 08/10/2022   AST 15 08/10/2022   ALKPHOS 62 08/10/2022   BILITOT 0.3 08/10/2022      RADIOGRAPHY: DG CHEST PORT 1 VIEW  Result Date: 09/17/2022 CLINICAL DATA:  Pneumothorax/pleural effusion EXAM: PORTABLE CHEST 1 VIEW COMPARISON:  Prior chest x-ray 09/16/2022 FINDINGS: Minimal blunting of the right costophrenic angle likely reflects a small pleural effusion. Otherwise, the lungs are clear. Cardiac and mediastinal contours are within normal limits. No pneumothorax. No acute osseous abnormality. Surgical clips present in the right breast. IMPRESSION: Probable trace right pleural effusion. No pneumothorax identified. Electronically Signed   By: Jacqulynn Cadet M.D.   On: 09/17/2022 08:29   DG CHEST PORT 1 VIEW  Result Date: 09/16/2022 CLINICAL DATA:  Right chest tube removal EXAM: PORTABLE CHEST 1 VIEW COMPARISON:  09/16/2022 FINDINGS: Interval removal of right pigtail chest tube. Lungs are well expanded. No pneumothorax or dependent pleural effusion. No focal pulmonary infiltrate. Cardiac size within normal limits. Rounded right hilar opacity persists possibly related to partial right upper lobe collapse or medially loculated pleural fluid. Cardiac size within normal limits. Pulmonary vascularity is normal. Surgical clips are seen within the right axilla and at the thoracic inlet. No acute bone abnormality. IMPRESSION: 1. Interval removal of right chest tube. No pneumothorax. 2. Persistent right hilar opacity, possibly related to partial right upper lobe collapse or medially loculated pleural fluid. This  could be further assessed with dedicated CT imaging or short-term follow-up two view chest radiograph. Electronically Signed   By: Fidela Salisbury M.D.   On: 09/16/2022 23:09   DG CHEST PORT 1 VIEW  Result Date: 09/16/2022 CLINICAL DATA:  Pneumothorax EXAM: PORTABLE CHEST 1 VIEW COMPARISON:  Chest x-ray 09/16/2022 FINDINGS: Right chest port is  unchanged in position. No pneumothorax visualized. Minimal atelectasis in the lung bases. No pleural effusion. Cardiomediastinal silhouette is within normal limits. Zipper artifact overlies in the chest, unchanged. Surgical clips are seen in the lower neck. No acute fractures are identified. IMPRESSION: 1. No pneumothorax visualized. 2. Minimal atelectasis in the lung bases. Electronically Signed   By: Ronney Asters M.D.   On: 09/16/2022 17:34   DG CHEST PORT 1 VIEW  Result Date: 09/16/2022 CLINICAL DATA:  [id number] Pneumothorax [id number] EXAM: PORTABLE CHEST 1 VIEW COMPARISON:  09/15/2022 FINDINGS: Right chest tube remains in place, unchanged. No visible pneumothorax. Density projects over the right hilum, new since prior study of unknown etiology. This could be external to the patient. Recommend clinical correlation. Heart is normal size. No confluent airspace opacities or effusions. IMPRESSION: Right chest tube without pneumothorax. Radiopaque density projects over the right hilum of unknown etiology, possibly external to the patient. Recommend clinical correlation. Electronically Signed   By: Rolm Baptise M.D.   On: 09/16/2022 09:25   DG Chest Portable 1 View  Result Date: 09/15/2022 CLINICAL DATA:  Right-sided chest tube placement EXAM: PORTABLE CHEST 1 VIEW COMPARISON:  09/15/2022 FINDINGS: Right-sided chest tube. No focal consolidation. No pleural effusion or pneumothorax. Heart and mediastinal contours are unremarkable. No acute osseous abnormality. IMPRESSION: 1. Right-sided chest tube. No pneumothorax. Electronically Signed   By: Kathreen Devoid M.D.   On:  09/15/2022 09:48   DG Chest 2 View  Result Date: 09/15/2022 CLINICAL DATA:  Follow up pneumothorax. Recent breast biopsy and surgery. EXAM: CHEST - 2 VIEW COMPARISON:  Radiographs 09/14/2022 and 08/31/2021. FINDINGS: Previously demonstrated right-sided pneumothorax has enlarged, now moderate in size (approximately 25%). There is no associated mediastinal shift. Mild right lung atelectasis is present. The left lung is clear. The heart size and mediastinal contours are stable. Postsurgical changes are present within the right breast and right axilla. There are additional surgical clips at the thoracic inlet. No acute osseous findings are demonstrated. IMPRESSION: Interval enlargement of right-sided pneumothorax, now moderate in size but without tension component. These results will be called to the ordering clinician or representative by the Radiologist Assistant, and communication documented in the PACS or [employer]. Electronically Signed   By: Richardean Sale M.D.   On: 09/15/2022 08:25   DG CHEST PORT 1 VIEW  Result Date: 09/14/2022 CLINICAL DATA:  Evaluate for right-sided pneumothorax. EXAM: PORTABLE CHEST 1 VIEW COMPARISON:  August 31, 2021 FINDINGS: The heart size and mediastinal contours are within normal limits. There is a small right apical pneumothorax. There is no evidence of acute infiltrate or pleural effusion. Radiopaque surgical clips are seen within the anterior neck soft tissues on the left and lateral soft tissues of the right breast. The visualized skeletal structures are unremarkable. IMPRESSION: Small right apical pneumothorax. Electronically Signed   By: Virgina Norfolk M.D.   On: 09/14/2022 17:50   MM Breast Surgical Specimen  Result Date: 09/14/2022 CLINICAL DATA:  Status post right breast lumpectomy. EXAM: SPECIMEN RADIOGRAPH OF THE RIGHT BREAST COMPARISON:  Previous exam(s). FINDINGS: Status post excision of the right breast. The radioactive seed and barbell biopsy marker  clip are present and completely intact. IMPRESSION: Specimen radiograph of the right breast. Electronically Signed   By: Lovey Newcomer M.D.   On: 09/14/2022 16:35  MM Breast Surgical Specimen  Result Date: 09/14/2022 CLINICAL DATA:  Evaluate specimen EXAM: SPECIMEN RADIOGRAPH OF THE RIGHT BREAST COMPARISON:  Previous exam(s). FINDINGS: Status post excision of the right breast.  The cylinder shaped radioactive seed and biopsy marker clip are present, completely intact, and were marked for pathology. IMPRESSION: Specimen radiograph of the right breast. Electronically Signed   By: Dorise Bullion III M.D.   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 The patient recently completed neoadjuvant chemotherapy. Radioactive seed localization of both lesions is performed in anticipation of lumpectomy and excisional biopsy. EXAM: MAMMOGRAPHIC GUIDED RADIOACTIVE SEED LOCALIZATION OF THE RIGHT BREAST x 2 COMPARISON:  Previous exam(s). FINDINGS: Patient presents for radioactive seed localization prior to RIGHT breast lumpectomy and RIGHT breast excisional biopsy. I met with the patient and we discussed the procedure of seed localization including benefits and alternatives. We discussed the high likelihood of a successful procedure. We discussed the risks of the procedure including infection, bleeding, tissue injury and further surgery. We discussed the low dose of radioactivity involved in the procedure. Informed, written consent was given. The usual time-out protocol was performed immediately prior to the procedure. Initially, using mammographic guidance, sterile technique with chlorhexidine as skin antisepsis, 1% lidocaine as local  anesthetic, an I-125 radioactive seed was used to localize the cylinder shaped tissue marking clip associated with the biopsy-proven IDC and DCIS in the [place] at posterior depth using a lateral approach. Subsequently, using mammographic guidance, sterile technique with chlorhexidine as skin antisepsis, 1% lidocaine as local anesthetic, an I-125 radioactive seed was used to localize the hourglass shaped tissue marking clip associated with the biopsy-proven adenomyoepithelioma in the outer subareolar location using a lateral approach. The follow-up mammogram images confirm that both seeds are in the expected location immediately adjacent to the biopsy clips. The images are marked for Dr. Donne Hazel. Follow-up survey of the patient confirms the presence of the radioactive seed. Seed associated with cylinder clip: Order number of I-125 seed: [id number] Total activity: 0.242 mCi Reference Date: 08/16/2022 Seed associated with hourglass clip: Order number of I-125 seed: [id number] Total activity: 0.247 mCi Reference date: 08/04/2022 The patient tolerated the procedure well and was released from the [facility]. She was given instructions regarding seed removal. 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 No apparent immediate complications. Electronically Signed   By: Evangeline Dakin M.D.   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 The patient recently completed neoadjuvant chemotherapy. Radioactive seed localization of both  lesions is performed in anticipation of lumpectomy and excisional biopsy. EXAM: MAMMOGRAPHIC GUIDED RADIOACTIVE SEED LOCALIZATION OF THE RIGHT BREAST x 2 COMPARISON:  Previous exam(s). FINDINGS: Patient presents for radioactive seed localization prior to RIGHT breast lumpectomy and RIGHT breast excisional biopsy. I met with the patient and we discussed the procedure of seed localization including benefits and alternatives. We discussed the high likelihood of a successful procedure. We discussed the risks of the procedure including infection, bleeding, tissue injury and further surgery. We discussed the low dose of radioactivity involved in  the procedure. Informed, written consent was given. The usual time-out protocol was performed immediately prior to the procedure. Initially, using mammographic guidance, sterile technique with chlorhexidine as skin antisepsis, 1% lidocaine as local anesthetic, an I-125 radioactive seed was used to localize the cylinder shaped tissue marking clip associated with the biopsy-proven IDC and DCIS in the [place] at posterior depth using a lateral approach. Subsequently, using mammographic guidance, sterile technique with chlorhexidine as skin antisepsis, 1% lidocaine as local anesthetic, an I-125 radioactive seed was used to localize the hourglass shaped tissue marking clip associated with the biopsy-proven adenomyoepithelioma in the outer subareolar location using a lateral approach. The follow-up mammogram images confirm that both seeds are in the expected location immediately adjacent to the biopsy clips. The images are marked for Dr. Donne Hazel. Follow-up survey of the patient confirms the presence of the radioactive seed. Seed associated with cylinder clip: Order number of I-125 seed: [id number] Total activity: 0.242 mCi Reference Date: 08/16/2022 Seed associated with hourglass clip: Order number of I-125 seed: [id number] Total activity: 0.247 mCi Reference date:  08/04/2022 The patient tolerated the procedure well and was released from the [facility]. She was given instructions regarding seed removal. 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 No apparent immediate complications. Electronically Signed   By: Evangeline Dakin M.D.   On: 09/13/2022 15:46      IMPRESSION/PLAN: 1. 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 Dr. Lisbeth Renshaw has reviewed the patient's final pathology findings and it appears that she had a good response to treatment with minimal residual disease.  Today she and I reviewed the nature of right  breast disease.  She is now completed systemic and surgical treatment.  Dr. Lisbeth Renshaw recommends adjuvant external radiotherapy to the breast  to reduce risks of local recurrence. We discussed the risks, benefits, short, and long term effects of radiotherapy, as well as the curative intent, and the patient is interested in proceeding.  I reviewed the delivery and logistics of radiotherapy and Dr. Lisbeth Renshaw recommends 4 weeks of radiotherapy to the right breast. Written consent is obtained and placed in the chart, a copy was provided to the patient.  She will simulate today. 2. Remote history of ER positive left breast cancer. This will be followed in surveillance along with surveillance for #1.     In a visit lasting *** minutes, greater than 50% of the time was spent face to face discussing the patient's condition, in preparation for the discussion, and coordinating the patient's care.     Carola Rhine, [facility]    **Disclaimer: This note was dictated with voice recognition software. Similar sounding words can inadvertently be transcribed and this note may contain transcription errors which may not have been corrected upon  publication of note.**

## 2022-10-11 ENCOUNTER — Ambulatory Visit: Payer: Medicare Other | Admitting: Radiation Oncology

## 2022-10-11 ENCOUNTER — Ambulatory Visit
Admission: RE | Admit: 2022-10-11 | Discharge: 2022-10-11 | Disposition: A | Discharge status: Home / Self Care | Payer: Medicare Other | Source: Ambulatory Visit | Attending: Radiation Oncology | Admitting: Radiation Oncology

## 2022-10-11 ENCOUNTER — Encounter: Payer: Self-pay | Admitting: Radiation Oncology

## 2022-10-11 VITALS — BP 156/91 | HR 102 | Temp 97.0°F | Resp 18 | Ht 69.0 in | Wt 227.2 lb

## 2022-10-11 DIAGNOSIS — J90 Pleural effusion, not elsewhere classified: Secondary | ICD-10-CM | POA: Diagnosis not present

## 2022-10-11 DIAGNOSIS — Z171 Estrogen receptor negative status [ER-]: Secondary | ICD-10-CM | POA: Insufficient documentation

## 2022-10-11 DIAGNOSIS — Z803 Family history of malignant neoplasm of breast: Secondary | ICD-10-CM | POA: Insufficient documentation

## 2022-10-11 DIAGNOSIS — Z853 Personal history of malignant neoplasm of breast: Secondary | ICD-10-CM | POA: Insufficient documentation

## 2022-10-11 DIAGNOSIS — N183 Chronic kidney disease, stage 3 unspecified: Secondary | ICD-10-CM | POA: Diagnosis not present

## 2022-10-11 DIAGNOSIS — Z79899 Other long term (current) drug therapy: Secondary | ICD-10-CM | POA: Insufficient documentation

## 2022-10-11 DIAGNOSIS — E785 Hyperlipidemia, unspecified: Secondary | ICD-10-CM | POA: Insufficient documentation

## 2022-10-11 DIAGNOSIS — K5900 Constipation, unspecified: Secondary | ICD-10-CM | POA: Diagnosis not present

## 2022-10-11 DIAGNOSIS — G4730 Sleep apnea, unspecified: Secondary | ICD-10-CM | POA: Diagnosis not present

## 2022-10-11 DIAGNOSIS — E079 Disorder of thyroid, unspecified: Secondary | ICD-10-CM | POA: Diagnosis not present

## 2022-10-11 DIAGNOSIS — I10 Essential (primary) hypertension: Secondary | ICD-10-CM | POA: Diagnosis not present

## 2022-10-11 DIAGNOSIS — C50411 Malignant neoplasm of upper-outer quadrant of right female breast: Secondary | ICD-10-CM | POA: Insufficient documentation

## 2022-10-11 DIAGNOSIS — K219 Gastro-esophageal reflux disease without esophagitis: Secondary | ICD-10-CM | POA: Insufficient documentation

## 2022-10-11 DIAGNOSIS — Z801 Family history of malignant neoplasm of trachea, bronchus and lung: Secondary | ICD-10-CM | POA: Insufficient documentation

## 2022-10-11 NOTE — Progress Notes (Signed)
Follow-up-new nursing interview for Malignant neoplasm of upper-outer quadrant of right breast in female, estrogen receptor negative ([place]).  Patient identity verified. Patient reports RT breast tenderness 2/10 w/ incision line healing well. No other issues reported at this time.  Meaningful use complete. Hysterectomy.  BP (!) 156/91 (BP Location: Left Arm, Patient Position: Sitting, Cuff Size: Normal)   Pulse (!) 102   Temp (!) 97 F (36.1 C) (Temporal)   Resp 18   Ht 5' 9"$  (1.753 m)   Wt 227 lb 4 oz (103.1 kg)   SpO2 100%   BMI 33.56 kg/m   This concludes the interview.   Leandra Kern, LPN

## 2022-10-17 ENCOUNTER — Encounter (HOSPITAL_COMMUNITY): Payer: Self-pay

## 2022-10-18 ENCOUNTER — Other Ambulatory Visit: Payer: Self-pay

## 2022-10-18 ENCOUNTER — Ambulatory Visit
Admission: RE | Admit: 2022-10-18 | Discharge: 2022-10-18 | Disposition: A | Discharge status: Home / Self Care | Payer: Medicare Other | Source: Ambulatory Visit | Attending: Radiation Oncology | Admitting: Radiation Oncology

## 2022-10-18 DIAGNOSIS — C50411 Malignant neoplasm of upper-outer quadrant of right female breast: Secondary | ICD-10-CM | POA: Insufficient documentation

## 2022-10-18 DIAGNOSIS — Z510 Encounter for antineoplastic radiation therapy: Secondary | ICD-10-CM | POA: Diagnosis present

## 2022-10-19 ENCOUNTER — Encounter: Payer: Self-pay | Admitting: *Deleted

## 2022-10-23 DIAGNOSIS — Z510 Encounter for antineoplastic radiation therapy: Secondary | ICD-10-CM | POA: Diagnosis not present

## 2022-10-25 ENCOUNTER — Ambulatory Visit (INDEPENDENT_AMBULATORY_CARE_PROVIDER_SITE_OTHER): Payer: Medicare Other | Admitting: Family Medicine

## 2022-10-25 ENCOUNTER — Encounter: Payer: Self-pay | Admitting: Family Medicine

## 2022-10-25 VITALS — BP 138/88 | HR 86 | Temp 98.1°F | Resp 18 | Ht 69.0 in | Wt 230.0 lb

## 2022-10-25 DIAGNOSIS — F418 Other specified anxiety disorders: Secondary | ICD-10-CM | POA: Diagnosis not present

## 2022-10-25 DIAGNOSIS — Z0000 Encounter for general adult medical examination without abnormal findings: Secondary | ICD-10-CM

## 2022-10-25 MED ORDER — HYDROXYZINE HCL 25 MG PO TABS: 25.0000 mg | ORAL_TABLET | Freq: Three times a day (TID) | ORAL | 0 refills | Status: DC | PRN

## 2022-10-25 NOTE — Progress Notes (Signed)
Complete physical exam  Patient: Karina Ferguson   DOB: 11/05/1955   67 y.o. Female  MRN: [id number]  Subjective:    Chief Complaint  Patient presents with   Annual Exam    Pt states not fasting     Karina Ferguson is a 67 y.o. female who presents today for a complete physical exam. She reports consuming a general diet. Home exercise routine includes walking, stairs, bicycle. She generally feels well. She reports sleeping well. She does not have additional problems to discuss today.    Most recent fall risk assessment:    10/25/2022    4:08 PM  [place] in the past year? 0  Number falls in past yr: 0  Injury with Fall? 0     Most recent depression screenings:    10/25/2022    3:43 PM  PHQ 2/9 Scores  PHQ - 2 Score 1  PHQ- 9 Score 4    Vision:Within last year and Dental: No current dental problems and No regular dental care   Patient Active Problem List   Diagnosis Date Noted   Status post right breast lumpectomy 09/14/2022   Port-A-Cath in place 07/27/2022   Genetic testing 07/14/2022   Malignant neoplasm of upper-outer quadrant of right breast in female, estrogen receptor negative ([facility]) 06/15/2022   Moderate obstructive sleep apnea 02/17/2022   Snoring 10/04/2021   Allergic rhinitis 10/04/2021   Acute bacterial rhinosinusitis 08/31/2021   Upper airway cough syndrome 08/31/2021   Irritable bowel syndrome 04/11/2021   Impaired fasting glucose 02/12/2020   Keratoconjunctivitis sicca of both eyes not specified as Sjogren's 01/19/2020   Nuclear sclerotic cataract of both eyes 01/19/2020   Posterior vitreous detachment of both eyes 01/19/2020   Class 2 obesity 11/06/2017   Constipation 10/03/2015   Esophageal reflux 10/03/2015   Essential hypertension 10/03/2015   History of breast cancer 10/03/2015   History of IBS 10/03/2015   History of orthostatic hypotension 10/03/2015   Hyperlipemia 10/03/2015   Insomnia 10/03/2015   Pain, joint, multiple sites  10/03/2015   S/P partial thyroidectomy 10/03/2015   Situational anxiety 10/03/2015   Stage 3 chronic kidney disease ([place]) 10/03/2015   Left lumbar radiculopathy 06/30/2014   Neurogenic claudication 06/30/2014   Other intervertebral disc degeneration, lumbar region 06/30/2014   Family History  Problem Relation Age of Onset   Breast cancer Paternal Aunt    Lung cancer Paternal Uncle        he smoked   Lung cancer Paternal Uncle        he smoked   Colon cancer Neg Hx    Pancreatic cancer Neg Hx    Esophageal cancer Neg Hx    Stomach cancer Neg Hx    Rectal cancer Neg Hx    Allergies  Allergen Reactions   Amoxicillin Shortness Of Breath   Esomeprazole Magnesium Diarrhea   Rabeprazole Diarrhea      Patient Care Team: Terrilyn Saver, NP as PCP - General (Family Medicine) Surgery, [facility] (General Surgery)   Outpatient Medications Prior to Visit  Medication Sig   amLODipine (NORVASC) 10 MG tablet Take 10 mg by mouth daily.   gabapentin (NEURONTIN) 100 MG capsule Take 100 mg by mouth daily as needed (pain). PRN for back pain   hydrochlorothiazide (HYDRODIURIL) 25 MG tablet Take 25 mg by mouth daily.   metoprolol succinate (TOPROL-XL) 50 MG 24 hr tablet Take 1 tablet (50 mg total) by mouth daily.   montelukast (SINGULAIR) 10  MG tablet Take 1 tablet (10 mg total) by mouth daily.   Multiple Vitamin (MULTI-VITAMIN) tablet Take 1 tablet by mouth daily.   Omega-3 Fatty Acids (FISH OIL) 1000 MG CAPS Take 1,000 mg by mouth daily.   omeprazole (PRILOSEC) 20 MG capsule Take 1 capsule (20 mg total) by mouth daily.   vitamin B-12 (CYANOCOBALAMIN) 500 MCG tablet Take 500 mcg by mouth daily.   No facility-administered medications prior to visit.    ROS All review of systems negative except what is listed in the HPI        Objective:     BP 138/88 (BP Location: Left Arm, Patient Position: Sitting, Cuff Size: Large)   Pulse 86   Temp 98.1 F (36.7 C) (Oral)   Resp 18    Ht '5\' 9"'$  (1.753 m)   Wt 230 lb (104.3 kg)   SpO2 99%   BMI 33.97 kg/m    Physical Exam Vitals reviewed.  Constitutional:      General: She is not in acute distress.    Appearance: Normal appearance. She is not ill-appearing.  HENT:     Head: Normocephalic and atraumatic.     Right Ear: Tympanic membrane normal.     Left Ear: Tympanic membrane normal.     Nose: Nose normal.     Mouth/Throat:     Mouth: Mucous membranes are moist.     Pharynx: Oropharynx is clear.  Eyes:     Extraocular Movements: Extraocular movements intact.     Conjunctiva/sclera: Conjunctivae normal.     Pupils: Pupils are equal, round, and reactive to light.  Neck:     Vascular: No carotid bruit.  Cardiovascular:     Rate and Rhythm: Normal rate and regular rhythm.     Pulses: Normal pulses.     Heart sounds: Normal heart sounds.  Pulmonary:     Effort: Pulmonary effort is normal.     Breath sounds: Normal breath sounds.  Abdominal:     General: Abdomen is flat. Bowel sounds are normal. There is no distension.     Palpations: Abdomen is soft. There is no mass.     Tenderness: There is no abdominal tenderness. There is no right CVA tenderness, left CVA tenderness, guarding or rebound.  Genitourinary:    Comments: Deferred exam Musculoskeletal:        General: Normal range of motion.     Cervical back: Normal range of motion and neck supple. No tenderness.     Right lower leg: No edema.     Left lower leg: No edema.  Lymphadenopathy:     Cervical: No cervical adenopathy.  Skin:    General: Skin is warm and dry.     Capillary Refill: Capillary refill takes less than 2 seconds.  Neurological:     General: No focal deficit present.     Mental Status: She is alert and oriented to person, place, and time. Mental status is at baseline.  Psychiatric:        Mood and Affect: Mood normal.        Behavior: Behavior normal.        Thought Content: Thought content normal.        Judgment: Judgment normal.       No results found for any visits on 10/25/22.     Assessment & Plan:    Routine Health Maintenance and Physical Exam  Immunization History  Administered Date(s) Administered   PFIZER Comirnaty(Gray Top)Covid-19 Tri-Sucrose Vaccine 07/19/2020  PFIZER(Purple Top)SARS-COV-2 Vaccination 11/22/2019, 12/13/2019, 07/19/2020   Pneumococcal Conjugate-13 09/16/2020   Pneumococcal Polysaccharide-23 10/04/2021   Tdap 01/11/2012    Health Maintenance  Topic Date Due   Medicare Annual Wellness (AWV)  Never done   Hepatitis C Screening  Never done   Zoster Vaccines- Shingrix (1 of 2) Never done   COLONOSCOPY (Pts 45-27yr Insurance coverage will need to be confirmed)  Never done   DEXA SCAN  Never done   DTaP/Tdap/Td (2 - Td or Tdap) 01/10/2022   COVID-19 Vaccine (5 - 2023-24 season) 04/28/2022   INFLUENZA VACCINE  11/26/2022 (Originally 03/28/2022)   MAMMOGRAM  08/16/2024   Pneumonia Vaccine 67 Years old  Completed   HPV VACCINES  Aged Out    Discussed health benefits of physical activity, and encouraged her to engage in regular exercise appropriate for her age and condition.  Problem List Items Addressed This Visit       Other   Situational anxiety Discussed options with patient. She would like to start with a trial of hydroxyzine - mediction discussed and information sheet provided. No SI/HI. Reports good support system. She will follow-up if needed.    Relevant Medications   hydrOXYzine (ATARAX) 25 MG tablet   Other Visit Diagnoses     Annual physical exam    -  Primary   Relevant Orders   CBC with Differential/Platelet   Comprehensive metabolic panel   Hepatitis C antibody   Lipid panel   TSH   Urinalysis, Routine w reflex microscopic      Return in about 1 year (around 10/26/2023) for physical; routine f/u 6 months.     TTerrilyn Saver NP

## 2022-10-26 ENCOUNTER — Ambulatory Visit
Admission: RE | Admit: 2022-10-26 | Discharge: 2022-10-26 | Disposition: A | Discharge status: Home / Self Care | Payer: Medicare Other | Source: Ambulatory Visit | Attending: Radiation Oncology | Admitting: Radiation Oncology

## 2022-10-26 ENCOUNTER — Other Ambulatory Visit: Payer: Self-pay

## 2022-10-26 DIAGNOSIS — Z510 Encounter for antineoplastic radiation therapy: Secondary | ICD-10-CM | POA: Diagnosis not present

## 2022-10-26 DIAGNOSIS — Z171 Estrogen receptor negative status [ER-]: Secondary | ICD-10-CM

## 2022-10-26 LAB — RAD ONC ARIA SESSION SUMMARY
Course Elapsed Days: 0
Plan Fractions Treated to Date: 1
Plan Prescribed Dose Per Fraction: 2.66 Gy
Plan Total Fractions Prescribed: 16
Plan Total Prescribed Dose: 42.56 Gy
Reference Point Dosage Given to Date: 2.66 Gy
Reference Point Session Dosage Given: 2.66 Gy
Session Number: 1

## 2022-10-26 MED ORDER — ALRA NON-METALLIC DEODORANT (RAD-ONC)
1.0000 | Freq: Once | TOPICAL | Status: AC
  Administered 2022-10-26: 1 via TOPICAL

## 2022-10-26 MED ORDER — RADIAPLEXRX EX GEL: Freq: Once | CUTANEOUS | Status: AC

## 2022-10-26 NOTE — Progress Notes (Signed)
Pt here for patient teaching. Pt given Radiation and You booklet, skin care instructions, Alra deodorant, and Radiaplex gel. Reviewed areas of pertinence such as fatigue, hair loss, nausea and vomiting, skin changes, breast tenderness, breast swelling, cough, shortness of breath, earaches, and taste changes. Pt able to give teach back of to pat skin, use unscented/gentle soap, and drink plenty of water, apply Radiaplex bid, avoid applying anything to skin within 4 hours of treatment, avoid wearing an under wire bra, and to use an electric razor if they must shave. Pt verbalizes understanding of information given and will contact nursing with any questions or concerns.     [email or web address]

## 2022-10-27 ENCOUNTER — Ambulatory Visit
Admission: RE | Admit: 2022-10-27 | Discharge: 2022-10-27 | Disposition: A | Discharge status: Home / Self Care | Payer: Medicare Other | Source: Ambulatory Visit | Attending: Radiation Oncology | Admitting: Radiation Oncology

## 2022-10-27 ENCOUNTER — Other Ambulatory Visit: Payer: Self-pay

## 2022-10-27 DIAGNOSIS — Z510 Encounter for antineoplastic radiation therapy: Secondary | ICD-10-CM | POA: Insufficient documentation

## 2022-10-27 DIAGNOSIS — C50411 Malignant neoplasm of upper-outer quadrant of right female breast: Secondary | ICD-10-CM | POA: Insufficient documentation

## 2022-10-27 LAB — RAD ONC ARIA SESSION SUMMARY
Course Elapsed Days: 1
Plan Fractions Treated to Date: 2
Plan Prescribed Dose Per Fraction: 2.66 Gy
Plan Total Fractions Prescribed: 16
Plan Total Prescribed Dose: 42.56 Gy
Reference Point Dosage Given to Date: 5.32 Gy
Reference Point Session Dosage Given: 2.66 Gy
Session Number: 2

## 2022-10-30 ENCOUNTER — Ambulatory Visit: Payer: Medicare Other | Admitting: Radiation Oncology

## 2022-10-30 ENCOUNTER — Other Ambulatory Visit: Payer: Self-pay

## 2022-10-30 ENCOUNTER — Other Ambulatory Visit (INDEPENDENT_AMBULATORY_CARE_PROVIDER_SITE_OTHER): Payer: Medicare Other

## 2022-10-30 ENCOUNTER — Ambulatory Visit
Admission: RE | Admit: 2022-10-30 | Discharge: 2022-10-30 | Disposition: A | Discharge status: Home / Self Care | Payer: Medicare Other | Source: Ambulatory Visit | Attending: Radiation Oncology | Admitting: Radiation Oncology

## 2022-10-30 DIAGNOSIS — Z0000 Encounter for general adult medical examination without abnormal findings: Secondary | ICD-10-CM | POA: Diagnosis not present

## 2022-10-30 DIAGNOSIS — Z510 Encounter for antineoplastic radiation therapy: Secondary | ICD-10-CM | POA: Diagnosis not present

## 2022-10-30 DIAGNOSIS — E785 Hyperlipidemia, unspecified: Secondary | ICD-10-CM

## 2022-10-30 LAB — COMPREHENSIVE METABOLIC PANEL
ALT: 19 U/L (ref 0–35)
AST: 21 U/L (ref 0–37)
Albumin: 4.1 g/dL (ref 3.5–5.2)
Alkaline Phosphatase: 64 U/L (ref 39–117)
BUN: 11 mg/dL (ref 6–23)
CO2: 28 mEq/L (ref 19–32)
Calcium: 10.4 mg/dL (ref 8.4–10.5)
Chloride: 101 mEq/L (ref 96–112)
Creatinine, Ser: 0.87 mg/dL (ref 0.40–1.20)
GFR: 68.3 mL/min (ref >60.00)
Glucose, Bld: 97 mg/dL (ref 70–99)
Potassium: 4 mEq/L (ref 3.5–5.1)
Sodium: 138 mEq/L (ref 135–145)
Total Bilirubin: 0.5 mg/dL (ref 0.2–1.2)
Total Protein: 6.8 g/dL (ref 6.0–8.3)

## 2022-10-30 LAB — RAD ONC ARIA SESSION SUMMARY
Course Elapsed Days: 4
Plan Fractions Treated to Date: 3
Plan Prescribed Dose Per Fraction: 2.66 Gy
Plan Total Fractions Prescribed: 16
Plan Total Prescribed Dose: 42.56 Gy
Reference Point Dosage Given to Date: 7.98 Gy
Reference Point Session Dosage Given: 2.66 Gy
Session Number: 3

## 2022-10-30 LAB — CBC WITH DIFFERENTIAL/PLATELET
Basophils Absolute: 0 10*3/uL (ref 0.0–0.1)
Basophils Relative: 0.5 % (ref 0.0–3.0)
Eosinophils Absolute: 0.2 10*3/uL (ref 0.0–0.7)
Eosinophils Relative: 4.2 % (ref 0.0–5.0)
HCT: 37.1 % (ref 36.0–46.0)
Hemoglobin: 12.5 g/dL (ref 12.0–15.0)
Lymphocytes Relative: 25.2 % (ref 12.0–46.0)
Lymphs Abs: 1.4 10*3/uL (ref 0.7–4.0)
MCHC: 33.6 g/dL (ref 30.0–36.0)
MCV: 91 fl (ref 78.0–100.0)
Monocytes Absolute: 0.6 10*3/uL (ref 0.1–1.0)
Monocytes Relative: 10.6 % (ref 3.0–12.0)
Neutro Abs: 3.3 10*3/uL (ref 1.4–7.7)
Neutrophils Relative %: 59.5 % (ref 43.0–77.0)
Platelets: 221 10*3/uL (ref 150.0–400.0)
RBC: 4.08 Mil/uL (ref 3.87–5.11)
RDW: 13.3 % (ref 11.5–15.5)
WBC: 5.5 10*3/uL (ref 4.0–10.5)

## 2022-10-30 LAB — URINALYSIS, ROUTINE W REFLEX MICROSCOPIC
Bilirubin Urine: NEGATIVE
Hgb urine dipstick: NEGATIVE
Ketones, ur: NEGATIVE
Leukocytes,Ua: NEGATIVE
Nitrite: NEGATIVE
RBC / HPF: NONE SEEN (ref 0–?)
Specific Gravity, Urine: <=1.005 — AB (ref 1.000–1.030)
Total Protein, Urine: NEGATIVE
Urine Glucose: NEGATIVE
Urobilinogen, UA: 0.2 (ref 0.0–1.0)
WBC, UA: NONE SEEN (ref 0–?)
pH: 6.5 (ref 5.0–8.0)

## 2022-10-30 LAB — LIPID PANEL
Cholesterol: 210 mg/dL — ABNORMAL HIGH (ref 0–200)
HDL: 62.2 mg/dL (ref >39.00)
LDL Cholesterol: 130 mg/dL — ABNORMAL HIGH (ref 0–99)
NonHDL: 147.31
Total CHOL/HDL Ratio: 3
Triglycerides: 87 mg/dL (ref 0.0–149.0)
VLDL: 17.4 mg/dL (ref 0.0–40.0)

## 2022-10-30 LAB — TSH: TSH: 2.18 u[IU]/mL (ref 0.35–5.50)

## 2022-10-30 MED ORDER — ROSUVASTATIN CALCIUM 5 MG PO TABS: 5.0000 mg | ORAL_TABLET | Freq: Every day | ORAL | 1 refills | Status: DC

## 2022-10-30 NOTE — Progress Notes (Signed)
No charge. Unable to obtain specimens at initial visit. Pt returning for redraw.

## 2022-10-30 NOTE — Addendum Note (Signed)
Addended by: Caleen Jobs B on: 10/30/2022 08:40 PM   Modules accepted: Orders

## 2022-10-31 ENCOUNTER — Ambulatory Visit
Admission: RE | Admit: 2022-10-31 | Discharge: 2022-10-31 | Disposition: A | Discharge status: Home / Self Care | Payer: Medicare Other | Source: Ambulatory Visit | Attending: Radiation Oncology | Admitting: Radiation Oncology

## 2022-10-31 ENCOUNTER — Other Ambulatory Visit: Payer: Self-pay

## 2022-10-31 DIAGNOSIS — Z510 Encounter for antineoplastic radiation therapy: Secondary | ICD-10-CM | POA: Diagnosis not present

## 2022-10-31 LAB — RAD ONC ARIA SESSION SUMMARY
Course Elapsed Days: 5
Plan Fractions Treated to Date: 4
Plan Prescribed Dose Per Fraction: 2.66 Gy
Plan Total Fractions Prescribed: 16
Plan Total Prescribed Dose: 42.56 Gy
Reference Point Dosage Given to Date: 10.64 Gy
Reference Point Session Dosage Given: 2.66 Gy
Session Number: 4

## 2022-10-31 LAB — HEPATITIS C ANTIBODY: Hepatitis C Ab: NONREACTIVE

## 2022-11-01 ENCOUNTER — Other Ambulatory Visit: Payer: Self-pay

## 2022-11-01 ENCOUNTER — Ambulatory Visit
Admission: RE | Admit: 2022-11-01 | Discharge: 2022-11-01 | Disposition: A | Discharge status: Home / Self Care | Payer: Medicare Other | Source: Ambulatory Visit | Attending: Radiation Oncology | Admitting: Radiation Oncology

## 2022-11-01 DIAGNOSIS — Z510 Encounter for antineoplastic radiation therapy: Secondary | ICD-10-CM | POA: Diagnosis not present

## 2022-11-01 LAB — RAD ONC ARIA SESSION SUMMARY
Course Elapsed Days: 6
Plan Fractions Treated to Date: 5
Plan Prescribed Dose Per Fraction: 2.66 Gy
Plan Total Fractions Prescribed: 16
Plan Total Prescribed Dose: 42.56 Gy
Reference Point Dosage Given to Date: 13.3 Gy
Reference Point Session Dosage Given: 2.66 Gy
Session Number: 5

## 2022-11-02 ENCOUNTER — Other Ambulatory Visit: Payer: Self-pay

## 2022-11-02 ENCOUNTER — Ambulatory Visit
Admission: RE | Admit: 2022-11-02 | Discharge: 2022-11-02 | Disposition: A | Discharge status: Home / Self Care | Payer: Medicare Other | Source: Ambulatory Visit | Attending: Radiation Oncology | Admitting: Radiation Oncology

## 2022-11-02 DIAGNOSIS — Z510 Encounter for antineoplastic radiation therapy: Secondary | ICD-10-CM | POA: Diagnosis not present

## 2022-11-02 LAB — RAD ONC ARIA SESSION SUMMARY
Course Elapsed Days: 7
Plan Fractions Treated to Date: 6
Plan Prescribed Dose Per Fraction: 2.66 Gy
Plan Total Fractions Prescribed: 16
Plan Total Prescribed Dose: 42.56 Gy
Reference Point Dosage Given to Date: 15.96 Gy
Reference Point Session Dosage Given: 2.66 Gy
Session Number: 6

## 2022-11-03 ENCOUNTER — Ambulatory Visit
Admission: RE | Admit: 2022-11-03 | Discharge: 2022-11-03 | Disposition: A | Discharge status: Home / Self Care | Payer: Medicare Other | Source: Ambulatory Visit | Attending: Radiation Oncology | Admitting: Radiation Oncology

## 2022-11-03 ENCOUNTER — Other Ambulatory Visit: Payer: Self-pay

## 2022-11-03 DIAGNOSIS — Z510 Encounter for antineoplastic radiation therapy: Secondary | ICD-10-CM | POA: Diagnosis not present

## 2022-11-03 LAB — RAD ONC ARIA SESSION SUMMARY
Course Elapsed Days: 8
Plan Fractions Treated to Date: 7
Plan Prescribed Dose Per Fraction: 2.66 Gy
Plan Total Fractions Prescribed: 16
Plan Total Prescribed Dose: 42.56 Gy
Reference Point Dosage Given to Date: 18.62 Gy
Reference Point Session Dosage Given: 2.66 Gy
Session Number: 7

## 2022-11-06 ENCOUNTER — Other Ambulatory Visit: Payer: Self-pay

## 2022-11-06 ENCOUNTER — Ambulatory Visit
Admission: RE | Admit: 2022-11-06 | Discharge: 2022-11-06 | Disposition: A | Discharge status: Home / Self Care | Payer: Medicare Other | Source: Ambulatory Visit | Attending: Radiation Oncology | Admitting: Radiation Oncology

## 2022-11-06 DIAGNOSIS — Z510 Encounter for antineoplastic radiation therapy: Secondary | ICD-10-CM | POA: Diagnosis not present

## 2022-11-06 LAB — RAD ONC ARIA SESSION SUMMARY
Course Elapsed Days: 11
Plan Fractions Treated to Date: 8
Plan Prescribed Dose Per Fraction: 2.66 Gy
Plan Total Fractions Prescribed: 16
Plan Total Prescribed Dose: 42.56 Gy
Reference Point Dosage Given to Date: 21.28 Gy
Reference Point Session Dosage Given: 2.66 Gy
Session Number: 8

## 2022-11-07 ENCOUNTER — Other Ambulatory Visit: Payer: Self-pay

## 2022-11-07 ENCOUNTER — Ambulatory Visit
Admission: RE | Admit: 2022-11-07 | Discharge: 2022-11-07 | Disposition: A | Discharge status: Home / Self Care | Payer: Medicare Other | Source: Ambulatory Visit | Attending: Radiation Oncology | Admitting: Radiation Oncology

## 2022-11-07 DIAGNOSIS — Z510 Encounter for antineoplastic radiation therapy: Secondary | ICD-10-CM | POA: Diagnosis not present

## 2022-11-07 LAB — RAD ONC ARIA SESSION SUMMARY
Course Elapsed Days: 12
Plan Fractions Treated to Date: 9
Plan Prescribed Dose Per Fraction: 2.66 Gy
Plan Total Fractions Prescribed: 16
Plan Total Prescribed Dose: 42.56 Gy
Reference Point Dosage Given to Date: 23.94 Gy
Reference Point Session Dosage Given: 2.66 Gy
Session Number: 9

## 2022-11-08 ENCOUNTER — Other Ambulatory Visit: Payer: Self-pay

## 2022-11-08 ENCOUNTER — Ambulatory Visit
Admission: RE | Admit: 2022-11-08 | Discharge: 2022-11-08 | Disposition: A | Discharge status: Home / Self Care | Payer: Medicare Other | Source: Ambulatory Visit | Attending: Radiation Oncology | Admitting: Radiation Oncology

## 2022-11-08 DIAGNOSIS — Z510 Encounter for antineoplastic radiation therapy: Secondary | ICD-10-CM | POA: Diagnosis not present

## 2022-11-08 LAB — RAD ONC ARIA SESSION SUMMARY
Course Elapsed Days: 13
Plan Fractions Treated to Date: 10
Plan Prescribed Dose Per Fraction: 2.66 Gy
Plan Total Fractions Prescribed: 16
Plan Total Prescribed Dose: 42.56 Gy
Reference Point Dosage Given to Date: 26.6 Gy
Reference Point Session Dosage Given: 2.66 Gy
Session Number: 10

## 2022-11-09 ENCOUNTER — Ambulatory Visit
Admission: RE | Admit: 2022-11-09 | Discharge: 2022-11-09 | Disposition: A | Discharge status: Home / Self Care | Payer: Medicare Other | Source: Ambulatory Visit | Attending: Radiation Oncology | Admitting: Radiation Oncology

## 2022-11-09 ENCOUNTER — Other Ambulatory Visit: Payer: Self-pay

## 2022-11-09 DIAGNOSIS — Z510 Encounter for antineoplastic radiation therapy: Secondary | ICD-10-CM | POA: Diagnosis not present

## 2022-11-09 LAB — RAD ONC ARIA SESSION SUMMARY
Course Elapsed Days: 14
Plan Fractions Treated to Date: 11
Plan Prescribed Dose Per Fraction: 2.66 Gy
Plan Total Fractions Prescribed: 16
Plan Total Prescribed Dose: 42.56 Gy
Reference Point Dosage Given to Date: 29.26 Gy
Reference Point Session Dosage Given: 2.66 Gy
Session Number: 11

## 2022-11-10 ENCOUNTER — Other Ambulatory Visit: Payer: Self-pay

## 2022-11-10 ENCOUNTER — Ambulatory Visit
Admission: RE | Admit: 2022-11-10 | Discharge: 2022-11-10 | Disposition: A | Discharge status: Home / Self Care | Payer: Medicare Other | Source: Ambulatory Visit | Attending: Radiation Oncology | Admitting: Radiation Oncology

## 2022-11-10 DIAGNOSIS — Z510 Encounter for antineoplastic radiation therapy: Secondary | ICD-10-CM | POA: Diagnosis not present

## 2022-11-10 LAB — RAD ONC ARIA SESSION SUMMARY
Course Elapsed Days: 15
Plan Fractions Treated to Date: 12
Plan Prescribed Dose Per Fraction: 2.66 Gy
Plan Total Fractions Prescribed: 16
Plan Total Prescribed Dose: 42.56 Gy
Reference Point Dosage Given to Date: 31.92 Gy
Reference Point Session Dosage Given: 2.66 Gy
Session Number: 12

## 2022-11-13 ENCOUNTER — Ambulatory Visit
Admission: RE | Admit: 2022-11-13 | Discharge: 2022-11-13 | Disposition: A | Discharge status: Home / Self Care | Payer: Medicare Other | Source: Ambulatory Visit | Attending: Radiation Oncology | Admitting: Radiation Oncology

## 2022-11-13 ENCOUNTER — Other Ambulatory Visit: Payer: Self-pay

## 2022-11-13 DIAGNOSIS — Z510 Encounter for antineoplastic radiation therapy: Secondary | ICD-10-CM | POA: Diagnosis not present

## 2022-11-13 LAB — RAD ONC ARIA SESSION SUMMARY
Course Elapsed Days: 18
Plan Fractions Treated to Date: 13
Plan Prescribed Dose Per Fraction: 2.66 Gy
Plan Total Fractions Prescribed: 16
Plan Total Prescribed Dose: 42.56 Gy
Reference Point Dosage Given to Date: 34.58 Gy
Reference Point Session Dosage Given: 2.66 Gy
Session Number: 13

## 2022-11-14 ENCOUNTER — Ambulatory Visit
Admission: RE | Admit: 2022-11-14 | Discharge: 2022-11-14 | Disposition: A | Discharge status: Home / Self Care | Payer: Medicare Other | Source: Ambulatory Visit | Attending: Radiation Oncology | Admitting: Radiation Oncology

## 2022-11-14 ENCOUNTER — Other Ambulatory Visit: Payer: Self-pay

## 2022-11-14 DIAGNOSIS — Z510 Encounter for antineoplastic radiation therapy: Secondary | ICD-10-CM | POA: Diagnosis not present

## 2022-11-14 LAB — RAD ONC ARIA SESSION SUMMARY
Course Elapsed Days: 19
Plan Fractions Treated to Date: 14
Plan Prescribed Dose Per Fraction: 2.66 Gy
Plan Total Fractions Prescribed: 16
Plan Total Prescribed Dose: 42.56 Gy
Reference Point Dosage Given to Date: 37.24 Gy
Reference Point Session Dosage Given: 2.66 Gy
Session Number: 14

## 2022-11-15 ENCOUNTER — Other Ambulatory Visit: Payer: Self-pay

## 2022-11-15 ENCOUNTER — Ambulatory Visit
Admission: RE | Admit: 2022-11-15 | Discharge: 2022-11-15 | Disposition: A | Discharge status: Home / Self Care | Payer: Medicare Other | Source: Ambulatory Visit | Attending: Radiation Oncology | Admitting: Radiation Oncology

## 2022-11-15 DIAGNOSIS — Z510 Encounter for antineoplastic radiation therapy: Secondary | ICD-10-CM | POA: Diagnosis not present

## 2022-11-15 LAB — RAD ONC ARIA SESSION SUMMARY
Course Elapsed Days: 20
Plan Fractions Treated to Date: 15
Plan Prescribed Dose Per Fraction: 2.66 Gy
Plan Total Fractions Prescribed: 16
Plan Total Prescribed Dose: 42.56 Gy
Reference Point Dosage Given to Date: 39.9 Gy
Reference Point Session Dosage Given: 2.66 Gy
Session Number: 15

## 2022-11-16 ENCOUNTER — Other Ambulatory Visit: Payer: Self-pay

## 2022-11-16 ENCOUNTER — Ambulatory Visit
Admission: RE | Admit: 2022-11-16 | Discharge: 2022-11-16 | Disposition: A | Discharge status: Home / Self Care | Payer: Medicare Other | Source: Ambulatory Visit | Attending: Radiation Oncology | Admitting: Radiation Oncology

## 2022-11-16 DIAGNOSIS — Z510 Encounter for antineoplastic radiation therapy: Secondary | ICD-10-CM | POA: Diagnosis not present

## 2022-11-16 LAB — RAD ONC ARIA SESSION SUMMARY
Course Elapsed Days: 21
Plan Fractions Treated to Date: 16
Plan Prescribed Dose Per Fraction: 2.66 Gy
Plan Total Fractions Prescribed: 16
Plan Total Prescribed Dose: 42.56 Gy
Reference Point Dosage Given to Date: 42.56 Gy
Reference Point Session Dosage Given: 2.66 Gy
Session Number: 16

## 2022-11-17 ENCOUNTER — Other Ambulatory Visit: Payer: Self-pay

## 2022-11-17 ENCOUNTER — Ambulatory Visit
Admission: RE | Admit: 2022-11-17 | Discharge: 2022-11-17 | Disposition: A | Discharge status: Home / Self Care | Payer: Medicare Other | Source: Ambulatory Visit | Attending: Radiation Oncology | Admitting: Radiation Oncology

## 2022-11-17 ENCOUNTER — Ambulatory Visit: Payer: Medicare Other

## 2022-11-17 ENCOUNTER — Ambulatory Visit: Payer: Medicare Other | Admitting: Radiation Oncology

## 2022-11-17 DIAGNOSIS — Z510 Encounter for antineoplastic radiation therapy: Secondary | ICD-10-CM | POA: Diagnosis not present

## 2022-11-17 LAB — RAD ONC ARIA SESSION SUMMARY
Course Elapsed Days: 22
Plan Fractions Treated to Date: 1
Plan Prescribed Dose Per Fraction: 2 Gy
Plan Total Fractions Prescribed: 4
Plan Total Prescribed Dose: 8 Gy
Reference Point Dosage Given to Date: 2 Gy
Reference Point Session Dosage Given: 2 Gy
Session Number: 17

## 2022-11-20 ENCOUNTER — Ambulatory Visit
Admission: RE | Admit: 2022-11-20 | Discharge: 2022-11-20 | Disposition: A | Discharge status: Home / Self Care | Payer: Medicare Other | Source: Ambulatory Visit | Attending: Radiation Oncology | Admitting: Radiation Oncology

## 2022-11-20 ENCOUNTER — Other Ambulatory Visit: Payer: Self-pay

## 2022-11-20 DIAGNOSIS — Z510 Encounter for antineoplastic radiation therapy: Secondary | ICD-10-CM | POA: Diagnosis not present

## 2022-11-20 LAB — RAD ONC ARIA SESSION SUMMARY
Course Elapsed Days: 25
Plan Fractions Treated to Date: 2
Plan Prescribed Dose Per Fraction: 2 Gy
Plan Total Fractions Prescribed: 4
Plan Total Prescribed Dose: 8 Gy
Reference Point Dosage Given to Date: 4 Gy
Reference Point Session Dosage Given: 2 Gy
Session Number: 18

## 2022-11-21 ENCOUNTER — Ambulatory Visit
Admission: RE | Admit: 2022-11-21 | Discharge: 2022-11-21 | Disposition: A | Discharge status: Home / Self Care | Payer: Medicare Other | Source: Ambulatory Visit | Attending: Radiation Oncology | Admitting: Radiation Oncology

## 2022-11-21 ENCOUNTER — Other Ambulatory Visit: Payer: Self-pay

## 2022-11-21 ENCOUNTER — Encounter: Payer: Self-pay | Admitting: *Deleted

## 2022-11-21 ENCOUNTER — Ambulatory Visit: Payer: Medicare Other

## 2022-11-21 DIAGNOSIS — Z171 Estrogen receptor negative status [ER-]: Secondary | ICD-10-CM

## 2022-11-21 DIAGNOSIS — Z510 Encounter for antineoplastic radiation therapy: Secondary | ICD-10-CM | POA: Diagnosis not present

## 2022-11-21 LAB — RAD ONC ARIA SESSION SUMMARY
Course Elapsed Days: 26
Plan Fractions Treated to Date: 3
Plan Prescribed Dose Per Fraction: 2 Gy
Plan Total Fractions Prescribed: 4
Plan Total Prescribed Dose: 8 Gy
Reference Point Dosage Given to Date: 6 Gy
Reference Point Session Dosage Given: 2 Gy
Session Number: 19

## 2022-11-22 ENCOUNTER — Other Ambulatory Visit: Payer: Self-pay

## 2022-11-22 ENCOUNTER — Encounter: Payer: Self-pay | Admitting: Radiation Oncology

## 2022-11-22 ENCOUNTER — Ambulatory Visit: Payer: Medicare Other

## 2022-11-22 ENCOUNTER — Ambulatory Visit
Admission: RE | Admit: 2022-11-22 | Discharge: 2022-11-22 | Disposition: A | Discharge status: Home / Self Care | Payer: Medicare Other | Source: Ambulatory Visit | Attending: Radiation Oncology | Admitting: Radiation Oncology

## 2022-11-22 DIAGNOSIS — Z510 Encounter for antineoplastic radiation therapy: Secondary | ICD-10-CM | POA: Diagnosis not present

## 2022-11-22 LAB — RAD ONC ARIA SESSION SUMMARY
Course Elapsed Days: 27
Plan Fractions Treated to Date: 4
Plan Prescribed Dose Per Fraction: 2 Gy
Plan Total Fractions Prescribed: 4
Plan Total Prescribed Dose: 8 Gy
Reference Point Dosage Given to Date: 8 Gy
Reference Point Session Dosage Given: 2 Gy
Session Number: 20

## 2022-11-23 ENCOUNTER — Ambulatory Visit: Payer: Medicare Other

## 2022-11-24 ENCOUNTER — Ambulatory Visit: Payer: Medicare Other

## 2022-11-27 ENCOUNTER — Encounter: Payer: Self-pay | Admitting: Hematology and Oncology

## 2022-11-27 NOTE — Progress Notes (Signed)
  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 This was delivered using a 3-D conformal technique. The patient then received a boost to the seroma. This delivered an additional 8 Gy in 4 fractions using an en face electron field due to the depth of the seroma. The total dose was 50.56 Gy.  Narrative: The patient tolerated radiotherapy. She developed fatigue and anticipated skin changes in the treatment field.   Plan: The patient will receive a call in about one month from the radiation oncology department. She will continue follow up with Dr. Lindi Adie as well.     Carola Rhine, PAC

## 2022-11-28 ENCOUNTER — Other Ambulatory Visit: Payer: Self-pay

## 2022-11-28 ENCOUNTER — Telehealth: Payer: Self-pay | Admitting: Family Medicine

## 2022-11-28 IMAGING — CT CT ABD-PELV W/ CM
2 of 5 series · 16 of 46 positions shown, 18 images · IV contrast (omnipaque)
Comparison: None.

CLINICAL DATA: Left lower quadrant pain and tenderness for 2
months.

EXAM:
CT ABDOMEN AND PELVIS WITH CONTRAST
TECHNIQUE: Multidetector CT imaging of the abdomen and pelvis was performed
using the standard protocol following bolus administration of
intravenous contrast.
CONTRAST:  100mL OMNIPAQUE IOHEXOL 300 MG/ML  SOLN

[Series 2: axial st · axial · 0.98mm/px · z∈[-500,-65]mm · 13 of 99 slices shown, 15 images]
[im 6/99  soft-tissue]
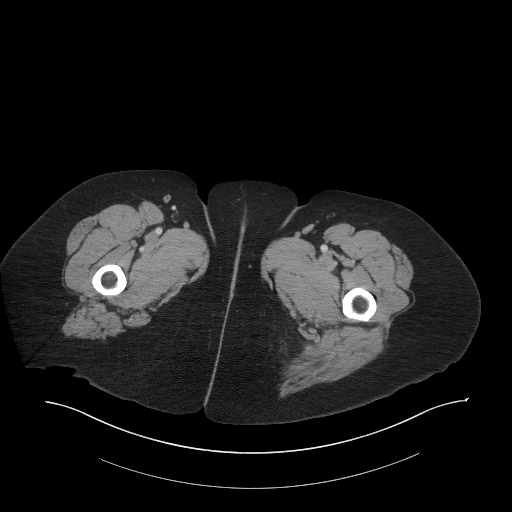
[im 6/99  bone]
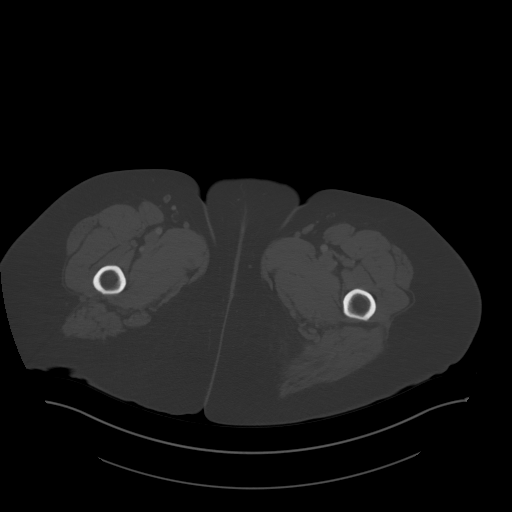
[im 16/99  soft-tissue]
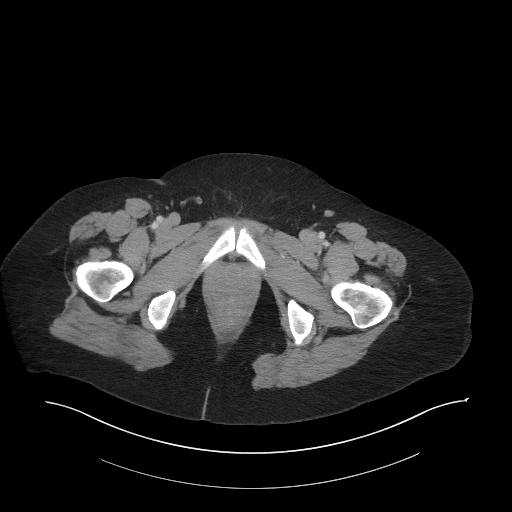
[im 21/99  soft-tissue]
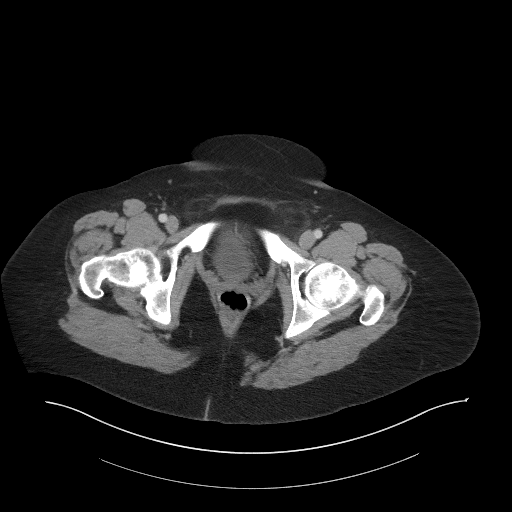
[im 26/99  soft-tissue]
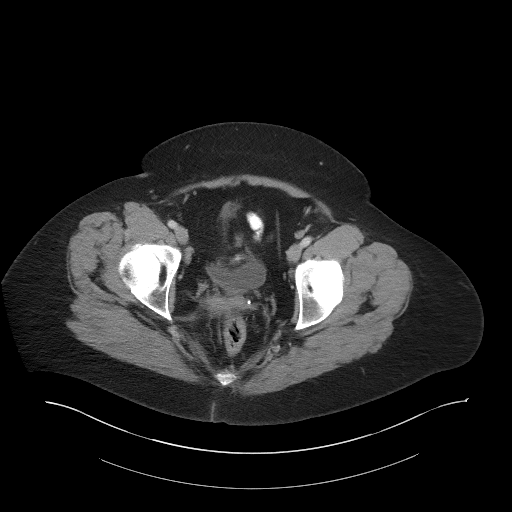
[im 37/99  soft-tissue]
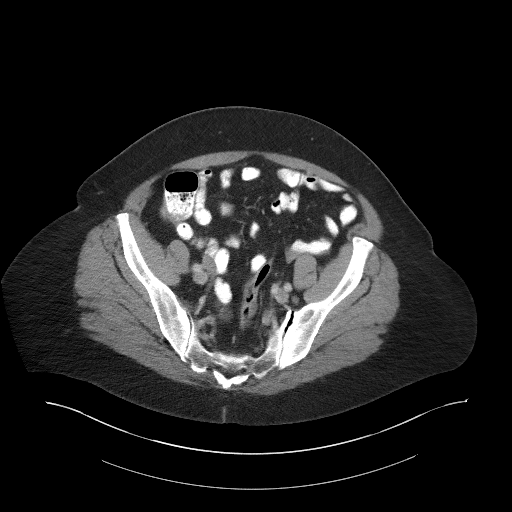
[im 42/99  soft-tissue]
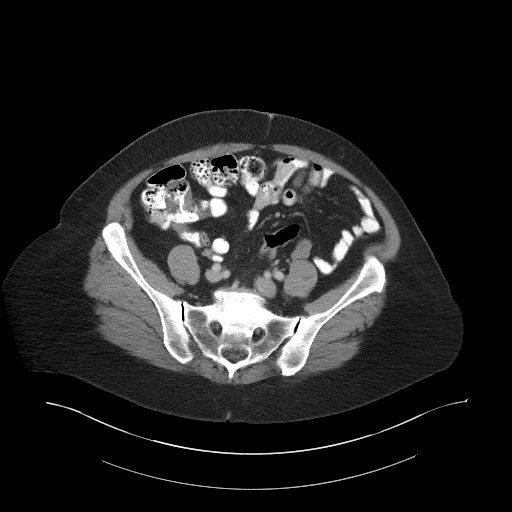
[im 52/99  soft-tissue]
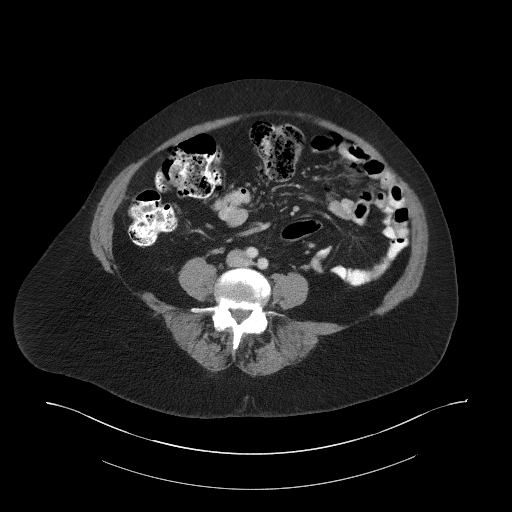
[im 57/99  soft-tissue]
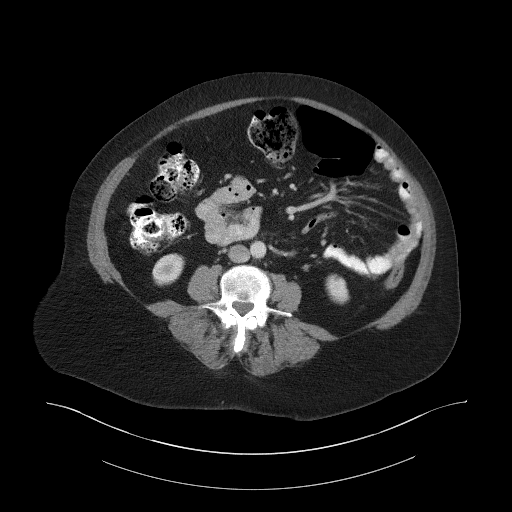
[im 62/99  soft-tissue]
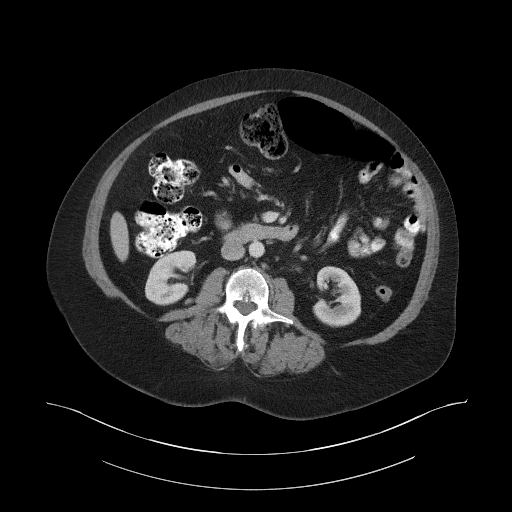
[im 62/99  bone]
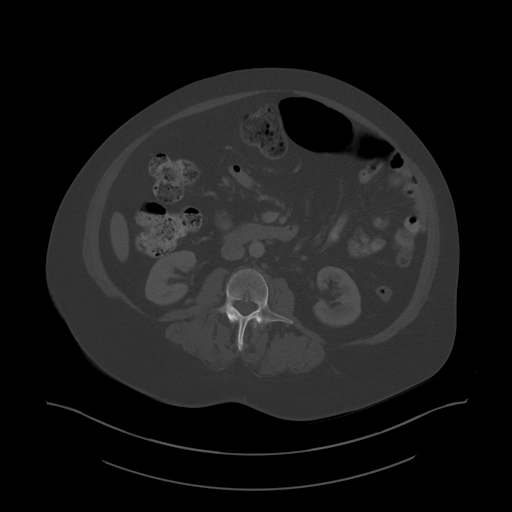
[im 73/99  soft-tissue]
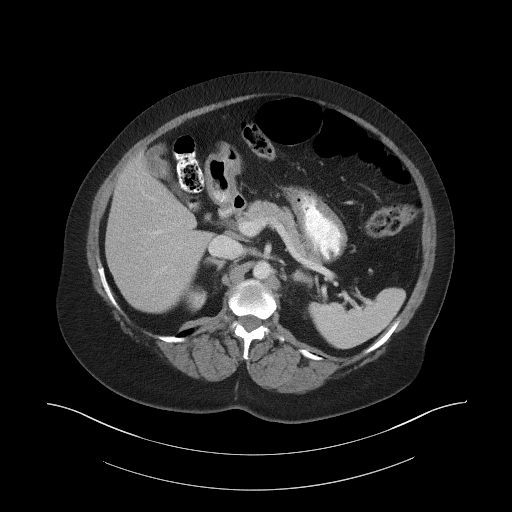
[im 78/99  soft-tissue]
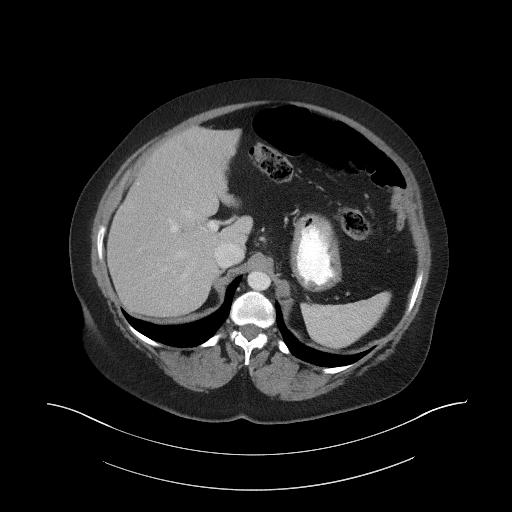
[im 83/99  soft-tissue]
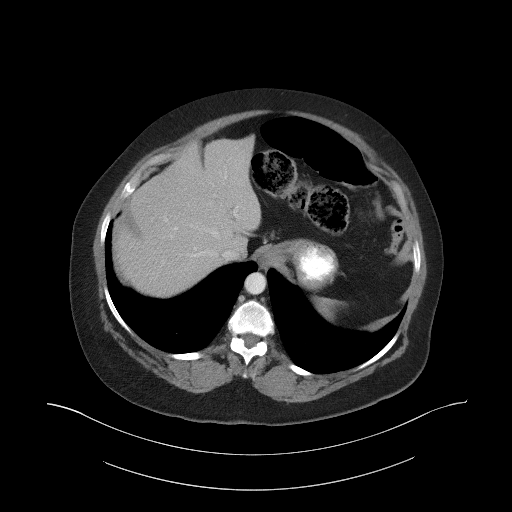
[im 93/99  soft-tissue]
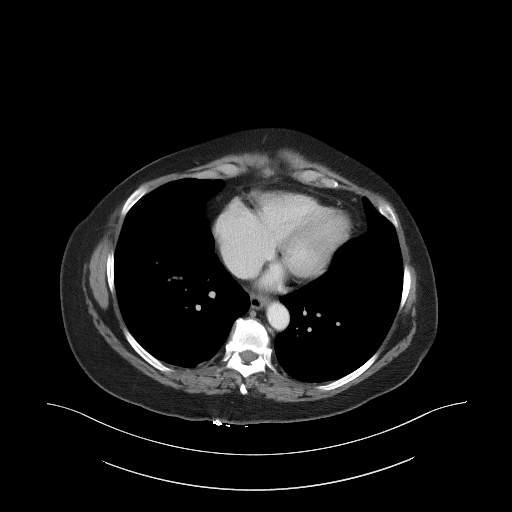

[Series 5: coronal st · coronal · 0.94mm/px · 3 of 118 slices shown]
[im 40/118  soft-tissue]
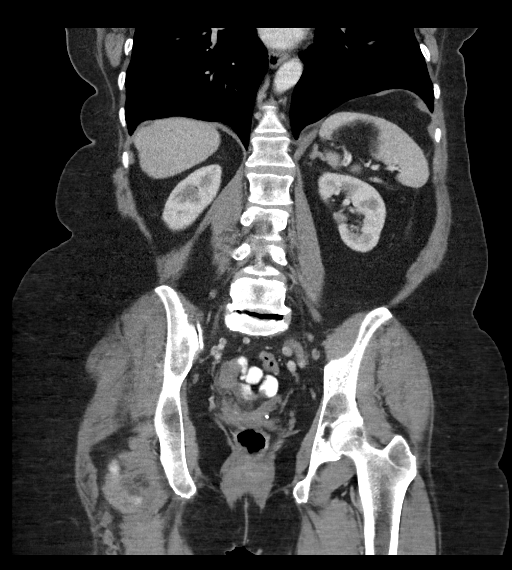
[im 53/118  soft-tissue]
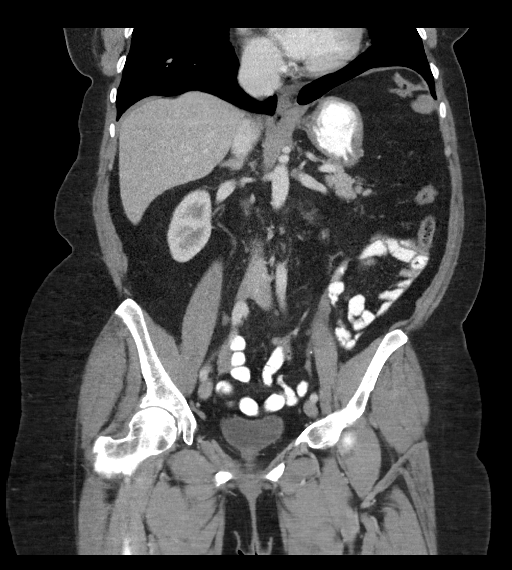
[im 66/118  soft-tissue]
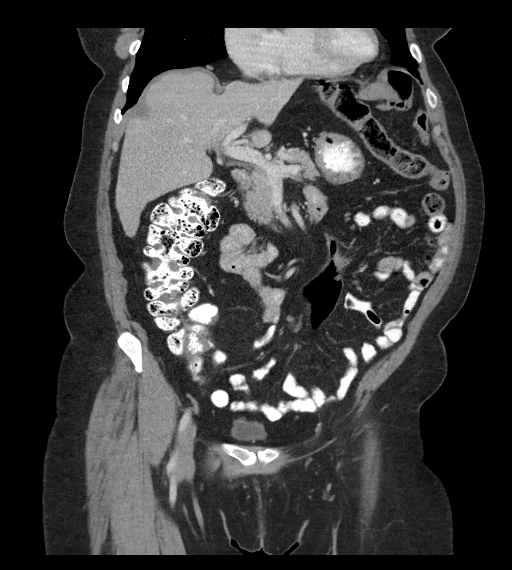

[16 of 46 positions shown; findings below may reference images not displayed]

FINDINGS: Lower Chest: No acute findings.

Hepatobiliary: No hepatic masses identified. Gallbladder is
unremarkable. No evidence of biliary ductal dilatation.

Pancreas:  No mass or inflammatory changes.

Spleen: Within normal limits in size and appearance.

Adrenals/Urinary Tract: No masses identified. No evidence of
ureteral calculi or hydronephrosis.

Stomach/Bowel: No evidence of obstruction, inflammatory process or
abnormal fluid collections.

Vascular/Lymphatic: No pathologically enlarged lymph nodes. No acute
vascular findings.

Reproductive: Previous hysterectomy. A simple appearing cyst is seen
in the right adnexa measuring 5.2 x 3.8 cm. A smaller simple
appearing cyst is seen in the left adnexa measuring 2.1 x 1.9 cm. No
evidence of inflammatory process or abnormal fluid collections.

Other:  Small left inguinal hernia is seen which contains fat.

Musculoskeletal:  No suspicious bone lesions identified.
IMPRESSION: Small left inguinal hernia, which contains only fat.

Bilateral benign-appearing adnexal cysts, largest measuring 5.2 cm
on the right. Recommend follow-up by pelvic US in 6-12 months. Note:
This recommendation does not apply to premenarchal patients and to
those with increased risk (genetic, family history, elevated tumor
markers or other high-risk factors) of ovarian cancer. Reference:
JACR [DATE]):[id number]

## 2022-11-28 NOTE — Telephone Encounter (Signed)
Contacted Karina Ferguson to schedule their annual wellness visit. Appointment made for 11/29/2022.  Sherol Dade; Care Guide Ambulatory Clinical [facility] Direct Dial: [phone]

## 2022-11-29 ENCOUNTER — Encounter: Payer: Self-pay | Admitting: *Deleted

## 2022-11-29 ENCOUNTER — Ambulatory Visit (INDEPENDENT_AMBULATORY_CARE_PROVIDER_SITE_OTHER): Payer: Medicare Other | Admitting: *Deleted

## 2022-11-29 VITALS — BP 130/85 | HR 75 | Ht 69.0 in | Wt 227.0 lb

## 2022-11-29 DIAGNOSIS — Z0000 Encounter for general adult medical examination without abnormal findings: Secondary | ICD-10-CM | POA: Diagnosis not present

## 2022-11-29 NOTE — Progress Notes (Signed)
Subjective:  Pt completed ADLs, Fall risk, and SDOH during e-check in on 11/28/22.  Answers verified with pt.    Karina Ferguson is a 67 y.o. female who presents for an Initial Medicare Annual Wellness Visit.  I connected with  Otilio Miu on 11/29/22 by a audio enabled telemedicine application and verified that I am speaking with the correct person using two identifiers.  Patient Location: Home  Provider Location: Office/Clinic  I discussed the limitations of evaluation and management by telemedicine. The patient expressed understanding and agreed to proceed.   Review of Systems     Cardiac Risk Factors include: advanced age (>2men, >72 women);dyslipidemia;hypertension;obesity (BMI >30kg/m2)     Objective:    Today's Vitals   11/29/22 1100  BP: 130/85  Pulse: 75  Weight: 227 lb (103 kg)  Height: 5\' 9"  (1.753 m)   Body mass index is 33.52 kg/m.     11/29/2022   11:03 AM 10/11/2022    1:13 PM 09/28/2022   11:25 AM 09/14/2022    1:28 PM 09/08/2022    1:12 PM 09/07/2022    1:40 PM 08/10/2022   10:16 AM  Advanced Directives  Does Patient Have a Medical Advance Directive? Yes Yes No No No No No  Type of Paramedic of [place];Living will Living will;Healthcare Power of Attorney       Does patient want to make changes to medical advance directive? No - Patient declined        Copy of [place] in Chart? No - copy requested        Would patient like information on creating a medical advance directive?   No - Patient declined No - Patient declined No - Patient declined No - Patient declined No - Patient declined    Current Medications (verified) Outpatient Encounter Medications as of 11/29/2022  Medication Sig   amLODipine (NORVASC) 10 MG tablet Take 10 mg by mouth daily.   gabapentin (NEURONTIN) 100 MG capsule Take 100 mg by mouth daily as needed (pain). PRN for back pain   hydrochlorothiazide (HYDRODIURIL) 25 MG tablet Take 25 mg by  mouth daily.   hydrOXYzine (ATARAX) 25 MG tablet Take 1 tablet (25 mg total) by mouth 3 (three) times daily as needed.   metoprolol succinate (TOPROL-XL) 50 MG 24 hr tablet Take 1 tablet (50 mg total) by mouth daily.   montelukast (SINGULAIR) 10 MG tablet Take 1 tablet (10 mg total) by mouth daily.   Multiple Vitamin (MULTI-VITAMIN) tablet Take 1 tablet by mouth daily.   Omega-3 Fatty Acids (FISH OIL) 1000 MG CAPS Take 1,000 mg by mouth daily.   omeprazole (PRILOSEC) 20 MG capsule Take 1 capsule (20 mg total) by mouth daily.   rosuvastatin (CRESTOR) 5 MG tablet Take 1 tablet (5 mg total) by mouth daily.   vitamin B-12 (CYANOCOBALAMIN) 500 MCG tablet Take 500 mcg by mouth daily.   No facility-administered encounter medications on file as of 11/29/2022.    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   Chronic idiopathic constipation    GERD (gastroesophageal reflux disease)    Hyperlipidemia    Hypertension    Insomnia    Sleep apnea    wears CPAP nightly  Stage 3 chronic kidney disease    Thyroid disease    Vertigo    Past Surgical History:  Procedure Laterality Date   ABDOMINAL HYSTERECTOMY     BREAST BIOPSY  09/13/2022   MM RT RADIOACTIVE SEED LOC MAMMO GUIDE 09/13/2022 [facility]   BREAST BIOPSY  09/13/2022   MM RT RADIOACTIVE SEED EA ADD LESION LOC MAMMO GUIDE 09/13/2022 [facility]   BREAST LUMPECTOMY WITH RADIOACTIVE SEED AND SENTINEL LYMPH NODE BIOPSY Right 09/14/2022   Procedure: RIGHT BREAST LUMPECTOMY WITH RADIOACTIVE SEED;  Surgeon: Rolm Bookbinder, MD;  Location[facility];  Service: General;  Laterality: Right;   Left breast cancer     20 years ago   PORT-A-CATH REMOVAL N/A 09/14/2022   Procedure: REMOVAL PORT-A-CATH;  Surgeon: Rolm Bookbinder, MD;  Location[facility] OR;  Service: General;  Laterality: N/A;   PORTACATH PLACEMENT Right 06/19/2022   Procedure: PORT PLACEMENT WITH ULTRASOUND GUIDANCE;  Surgeon: Rolm Bookbinder, MD;  Location: [place];  Service: General;  Laterality: Right;   RADIOACTIVE SEED GUIDED AXILLARY SENTINEL LYMPH NODE Right 09/14/2022   Procedure: Right AXILLARY SENTINEL LYMPH NODE;  Surgeon: Rolm Bookbinder, MD;  Location: [place];  Service: General;  Laterality: Right;   RADIOACTIVE SEED GUIDED EXCISIONAL BREAST BIOPSY Right 09/14/2022   Procedure: RADIOACTIVE SEED GUIDED EXCISIONAL BREAST BIOPSY;  Surgeon: Rolm Bookbinder, MD;  Location: [place];  Service: General;  Laterality: Right;   THYROIDECTOMY, PARTIAL     Family History  Problem Relation Age of Onset   Breast cancer Paternal Aunt    Lung cancer Paternal Uncle        he smoked   Lung cancer Paternal Uncle        he smoked   Colon cancer Neg Hx    Pancreatic cancer Neg Hx    Esophageal cancer Neg Hx    Stomach cancer Neg Hx    Rectal cancer Neg Hx    Social History   Socioeconomic History   Marital status: Married    Spouse name: Not on file   Number of children: Not on file   Years of education: Not on file   Highest education level: Not on file  Occupational History   Not on file  Tobacco Use   Smoking status: Never   Smokeless tobacco: Never  Vaping Use   Vaping Use: Never used  Substance and Sexual Activity   Alcohol use: Yes    Comment: occasional wine   Drug use: Never   Sexual activity: Yes    Birth control/protection: Surgical    Comment: hyst  Other Topics Concern   Not on file  Social History Narrative   She is married, she has 1 son and 1 daughter they are twins and 4 grandchildren.      She is retired from [employer] having done many jobs there.      No alcohol 1 caffeinated beverage a day never smoker and no tobacco.   Social Determinants of Health   Financial  Resource Strain: Low Risk  (11/28/2022)   Overall Financial Resource Strain (CARDIA)    Difficulty of Paying Living Expenses: Not hard at all  Food Insecurity: No Food Insecurity (11/28/2022)   Hunger Vital Sign    Worried About Running Out of Food in the Last Year: Never true    Ran Out of Food in the Last Year: Never true  Transportation Needs: No Transportation Needs (11/28/2022)   PRAPARE - Transportation  Lack of Transportation (Medical): No    Lack of Transportation (Non-Medical): No  Physical Activity: Insufficiently Active (11/28/2022)   Exercise Vital Sign    Days of Exercise per Week: 5 days    Minutes of Exercise per Session: 20 min  Stress: No Stress Concern Present (11/28/2022)   [place]    Feeling of Stress : Only a little  Social Connections: Unknown (11/28/2022)   Social Connection and Isolation Panel [NHANES]    Frequency of Communication with Friends and Family: More than three times a week    Frequency of Social Gatherings with Friends and Family: More than three times a week    Attends Religious Services: Not on Advertising copywriter or Organizations: Yes    Attends Music therapist: More than 4 times per year    Marital Status: Married    Tobacco Counseling Counseling given: Not Answered   Clinical Intake:  Pre-visit preparation completed: Yes  Pain : No/denies pain  BMI - recorded: 33.52 Nutritional Status: BMI > 30  Obese Nutritional Risks: None Diabetes: No  How often do you need to have someone help you when you read instructions, pamphlets, or other written materials from your doctor or pharmacy?: 1 - Never   Activities of Daily Living    11/28/2022    2:42 PM 09/14/2022   10:30 PM  In your present state of health, do you have any difficulty performing the following activities:  Hearing? 0 0  Vision? 1 0  Difficulty concentrating or making decisions? 0 0   Walking or climbing stairs? 0 0  Dressing or bathing? 0 0  Doing errands, shopping? 0 0  Preparing Food and eating ? N   Using the Toilet? N   In the past six months, have you accidently leaked urine? N   Do you have problems with loss of bowel control? N   Managing your Medications? N   Managing your Finances? N   Housekeeping or managing your Housekeeping? N     Patient Care Team: Terrilyn Saver, NP as PCP - General (Family Medicine) Surgery, [facility] (General Surgery)  Indicate any recent Medical Services you may have received from other than [facility] providers in the past year (date may be approximate).     Assessment:   This is a routine wellness examination for [place].  Hearing/Vision screen No results found.  Dietary issues and exercise activities discussed: Current Exercise Habits: Home exercise routine, Type of exercise: walking, Time (Minutes): 20, Frequency (Times/Week): 7, Weekly Exercise (Minutes/Week): 140, Intensity: Mild, Exercise limited by: None identified   Goals Addressed   None    Depression Screen    11/29/2022   11:04 AM 10/25/2022    3:43 PM  PHQ 2/9 Scores  PHQ - 2 Score 1 1  PHQ- 9 Score  4    Fall Risk    11/28/2022    2:42 PM 10/25/2022    4:08 PM  Fall Risk   Falls in the past year? 0 0  Number falls in past yr: 0 0  Injury with Fall? 0 0  Risk for fall due to : No Fall Risks   Follow up Falls evaluation completed     [place]:  Any stairs in or around the home? Yes  If so, are there any without handrails? No  Home free of loose throw rugs in walkways, pet  beds, electrical cords, etc? Yes  Adequate lighting in your home to reduce risk of falls? Yes   ASSISTIVE DEVICES UTILIZED TO PREVENT FALLS:  Life alert? No  Use of a cane, walker or w/c? No  Grab bars in the bathroom? Yes  Shower chair or bench in shower?  Has one but does not use it Elevated toilet seat or a handicapped toilet? No    TIMED UP AND GO:  Was the test performed?  No, audio visit .    Cognitive Function:        11/29/2022   11:12 AM  6CIT Screen  What Year? 0 points  What month? 0 points  What time? 0 points  Count back from 20 0 points  Months in reverse 0 points  Repeat phrase 0 points  Total Score 0 points    Immunizations Immunization History  Administered Date(s) Administered   PFIZER Comirnaty(Gray Top)Covid-19 Tri-Sucrose Vaccine 07/19/2020   PFIZER(Purple Top)SARS-COV-2 Vaccination 11/22/2019, 12/13/2019, 07/19/2020   Pneumococcal Conjugate-13 09/16/2020   Pneumococcal Polysaccharide-23 10/04/2021   Tdap 01/11/2012    TDAP status: Due, Education has been provided regarding the importance of this vaccine. Advised may receive this vaccine at local pharmacy or Health Dept. Aware to provide a copy of the vaccination record if obtained from local pharmacy or Health Dept. Verbalized acceptance and understanding.  Flu Vaccine status: Up to date  Pneumococcal vaccine status: Up to date  Covid-19 vaccine status: Information provided on how to obtain vaccines.   Qualifies for Shingles Vaccine? Yes   Zostavax completed No   Shingrix Completed?: No.    Education has been provided regarding the importance of this vaccine. Patient has been advised to call insurance company to determine out of pocket expense if they have not yet received this vaccine. Advised may also receive vaccine at local pharmacy or Health Dept. Verbalized acceptance and understanding.  Screening Tests Health Maintenance  Topic Date Due   Medicare Annual Wellness (AWV)  Never done   Zoster Vaccines- Shingrix (1 of 2) Never done   COLONOSCOPY (Pts 45-78yrs Insurance coverage will need to be confirmed)  Never done   DEXA SCAN  Never done   DTaP/Tdap/Td (2 - Td or Tdap) 01/10/2022   COVID-19 Vaccine (5 - 2023-24 season) 04/28/2022   INFLUENZA VACCINE  03/29/2023   MAMMOGRAM  08/16/2024   Pneumonia Vaccine 65+ Years  old  Completed   Hepatitis C Screening  Completed   HPV VACCINES  Aged Out    Health Maintenance  Health Maintenance Due  Topic Date Due   Medicare Annual Wellness (AWV)  Never done   Zoster Vaccines- Shingrix (1 of 2) Never done   COLONOSCOPY (Pts 45-7yrs Insurance coverage will need to be confirmed)  Never done   DEXA SCAN  Never done   DTaP/Tdap/Td (2 - Td or Tdap) 01/10/2022   COVID-19 Vaccine (5 - 2023-24 season) 04/28/2022    Colorectal cancer screening: Type of screening: Colonoscopy. Completed 2022 per pt. Repeat every ? years.  Will request report.  Mammogram status: Completed 08/16/22. Repeat every year  Bone Density status: Pt declined at this time.  Lung Cancer Screening: (Low Dose CT Chest recommended if Age 54-80 years, 30 pack-year currently smoking OR have quit w/in 15years.) does not qualify.   Additional Screening:  Hepatitis C Screening: does qualify; Completed 10/30/22  Vision Screening: Recommended annual ophthalmology exams for early detection of glaucoma and other disorders of the eye. Is the patient up to date with their  annual eye exam?  Yes  Who is the provider or what is the name of the office in which the patient attends annual eye exams? Dr. Trinna Post If pt is not established with a provider, would they like to be referred to a provider to establish care? No .   Dental Screening: Recommended annual dental exams for proper oral hygiene  Community Resource Referral / Chronic Care Management: CRR required this visit?  No   CCM required this visit?  No      Plan:     I have personally reviewed and noted the following in the patient's chart:   Medical and social history Use of alcohol, tobacco or illicit drugs  Current medications and supplements including opioid prescriptions. Patient is not currently taking opioid prescriptions. Functional ability and status Nutritional status Physical activity Advanced directives List of other  physicians Hospitalizations, surgeries, and ER visits in previous 12 months Vitals Screenings to include cognitive, depression, and falls Referrals and appointments  In addition, I have reviewed and discussed with patient certain preventive protocols, quality metrics, and best practice recommendations. A written personalized care plan for preventive services as well as general preventive health recommendations were provided to patient.   Due to this being a telephonic visit, the after visit summary with patients personalized plan was offered to patient via mail or my-chart. Patient would like to access on my-chart.  Beatris Ship, [place]   11/29/2022   Nurse Notes: None

## 2022-11-29 NOTE — Patient Instructions (Signed)
Karina Ferguson , Thank you for taking time to come for your Medicare Wellness Visit. I appreciate your ongoing commitment to your health goals. Please review the following plan we discussed and let me know if I can assist you in the future.   These are the goals we discussed:  Goals   None     This is a list of the screening recommended for you and due dates:  Health Maintenance  Topic Date Due   Zoster (Shingles) Vaccine (1 of 2) Never done   Colon Cancer Screening  Never done   DEXA scan (bone density measurement)  Never done   DTaP/Tdap/Td vaccine (2 - Td or Tdap) 01/10/2022   COVID-19 Vaccine (5 - 2023-24 season) 04/28/2022   Flu Shot  03/29/2023   Medicare Annual Wellness Visit  11/29/2023   Mammogram  08/16/2024   Pneumonia Vaccine  Completed   Hepatitis C Screening: USPSTF Recommendation to screen - Ages 18-79 yo.  Completed   HPV Vaccine  Aged Out     Next appointment: Follow up in one year for your annual wellness visit.   Preventive Care 65 Years and Older, Female Preventive care refers to lifestyle choices and visits with your health care provider that can promote health and wellness. What does preventive care include? A yearly physical exam. This is also called an annual well check. Dental exams once or twice a year. Routine eye exams. Ask your health care provider how often you should have your eyes checked. Personal lifestyle choices, including: Daily care of your teeth and gums. Regular physical activity. Eating a healthy diet. Avoiding tobacco and drug use. Limiting alcohol use. Practicing safe sex. Taking low-dose aspirin every day. Taking vitamin and mineral supplements as recommended by your health care provider. What happens during an annual well check? The services and screenings done by your health care provider during your annual well check will depend on your age, overall health, lifestyle risk factors, and family history of disease. Counseling  Your  health care provider may ask you questions about your: Alcohol use. Tobacco use. Drug use. Emotional well-being. Home and relationship well-being. Sexual activity. Eating habits. History of falls. Memory and ability to understand (cognition). Work and work Statistician. Reproductive health. Screening  You may have the following tests or measurements: Height, weight, and BMI. Blood pressure. Lipid and cholesterol levels. These may be checked every 5 years, or more frequently if you are over 48 years old. Skin check. Lung cancer screening. You may have this screening every year starting at age 50 if you have a 30-pack-year history of smoking and currently smoke or have quit within the past 15 years. Fecal occult blood test (FOBT) of the stool. You may have this test every year starting at age 32. Flexible sigmoidoscopy or colonoscopy. You may have a sigmoidoscopy every 5 years or a colonoscopy every 10 years starting at age 53. Hepatitis C blood test. Hepatitis B blood test. Sexually transmitted disease (STD) testing. Diabetes screening. This is done by checking your blood sugar (glucose) after you have not eaten for a while (fasting). You may have this done every 1-3 years. Bone density scan. This is done to screen for osteoporosis. You may have this done starting at age 2. Mammogram. This may be done every 1-2 years. Talk to your health care provider about how often you should have regular mammograms. Talk with your health care provider about your test results, treatment options, and if necessary, the need for more tests.  Vaccines  Your health care provider may recommend certain vaccines, such as: Influenza vaccine. This is recommended every year. Tetanus, diphtheria, and acellular pertussis (Tdap, Td) vaccine. You may need a Td booster every 10 years. Zoster vaccine. You may need this after age 45. Pneumococcal 13-valent conjugate (PCV13) vaccine. One dose is recommended after age  45. Pneumococcal polysaccharide (PPSV23) vaccine. One dose is recommended after age 89. Talk to your health care provider about which screenings and vaccines you need and how often you need them. This information is not intended to replace advice given to you by your health care provider. Make sure you discuss any questions you have with your health care provider. Document Released: 09/10/2015 Document Revised: 05/03/2016 Document Reviewed: 06/15/2015 Elsevier Interactive Patient Education  2017 [place] Prevention in the Home Falls can cause injuries. They can happen to people of all ages. There are many things you can do to make your home safe and to help prevent falls. What can I do on the outside of my home? Regularly fix the edges of walkways and driveways and fix any cracks. Remove anything that might make you trip as you walk through a door, such as a raised step or threshold. Trim any bushes or trees on the path to your home. Use bright outdoor lighting. Clear any walking paths of anything that might make someone trip, such as rocks or tools. Regularly check to see if handrails are loose or broken. Make sure that both sides of any steps have handrails. Any raised decks and porches should have guardrails on the edges. Have any leaves, snow, or ice cleared regularly. Use sand or salt on walking paths during winter. Clean up any spills in your garage right away. This includes oil or grease spills. What can I do in the bathroom? Use night lights. Install grab bars by the toilet and in the tub and shower. Do not use towel bars as grab bars. Use non-skid mats or decals in the tub or shower. If you need to sit down in the shower, use a plastic, non-slip stool. Keep the floor dry. Clean up any water that spills on the floor as soon as it happens. Remove soap buildup in the tub or shower regularly. Attach bath mats securely with double-sided non-slip rug tape. Do not have throw  rugs and other things on the floor that can make you trip. What can I do in the bedroom? Use night lights. Make sure that you have a light by your bed that is easy to reach. Do not use any sheets or blankets that are too big for your bed. They should not hang down onto the floor. Have a firm chair that has side arms. You can use this for support while you get dressed. Do not have throw rugs and other things on the floor that can make you trip. What can I do in the kitchen? Clean up any spills right away. Avoid walking on wet floors. Keep items that you use a lot in easy-to-reach places. If you need to reach something above you, use a strong step stool that has a grab bar. Keep electrical cords out of the way. Do not use floor polish or wax that makes floors slippery. If you must use wax, use non-skid floor wax. Do not have throw rugs and other things on the floor that can make you trip. What can I do with my stairs? Do not leave any items on the stairs. Make sure that  there are handrails on both sides of the stairs and use them. Fix handrails that are broken or loose. Make sure that handrails are as long as the stairways. Check any carpeting to make sure that it is firmly attached to the stairs. Fix any carpet that is loose or worn. Avoid having throw rugs at the top or bottom of the stairs. If you do have throw rugs, attach them to the floor with carpet tape. Make sure that you have a light switch at the top of the stairs and the bottom of the stairs. If you do not have them, ask someone to add them for you. What else can I do to help prevent falls? Wear shoes that: Do not have high heels. Have rubber bottoms. Are comfortable and fit you well. Are closed at the toe. Do not wear sandals. If you use a stepladder: Make sure that it is fully opened. Do not climb a closed stepladder. Make sure that both sides of the stepladder are locked into place. Ask someone to hold it for you, if  possible. Clearly mark and make sure that you can see: Any grab bars or handrails. First and last steps. Where the edge of each step is. Use tools that help you move around (mobility aids) if they are needed. These include: Canes. Walkers. Scooters. Crutches. Turn on the lights when you go into a dark area. Replace any light bulbs as soon as they burn out. Set up your furniture so you have a clear path. Avoid moving your furniture around. If any of your floors are uneven, fix them. If there are any pets around you, be aware of where they are. Review your medicines with your doctor. Some medicines can make you feel dizzy. This can increase your chance of falling. Ask your doctor what other things that you can do to help prevent falls. This information is not intended to replace advice given to you by your health care provider. Make sure you discuss any questions you have with your health care provider. Document Released: 06/10/2009 Document Revised: 01/20/2016 Document Reviewed: 09/18/2014 Elsevier Interactive Patient Education  2017 [employer].

## 2022-12-18 ENCOUNTER — Ambulatory Visit: Payer: Medicare Other | Attending: General Surgery

## 2022-12-18 VITALS — Wt 228.2 lb

## 2022-12-18 DIAGNOSIS — Z483 Aftercare following surgery for neoplasm: Secondary | ICD-10-CM | POA: Insufficient documentation

## 2022-12-18 NOTE — Therapy (Signed)
OUTPATIENT PHYSICAL THERAPY SOZO SCREENING NOTE   Patient Name: Karina Ferguson MRN: [id number] DOB:07-01-1956, 67 y.o., female Today's Date: 12/18/2022  PCP: Clayborne Dana, NP REFERRING PROVIDER: Emelia Loron, MD   PT End of Session - 12/18/22 1018     Visit Number 2   # unchanged due to screen only   PT Start Time 1016    PT Stop Time 1022    PT Time Calculation (min) 6 min    Activity Tolerance Patient tolerated treatment well    Behavior During Therapy [facility] for tasks assessed/performed             Past Medical History:  Diagnosis Date   Allergic rhinitis    Allergy    Anemia    Anxiety    Arthritis    Breast cancer 06/05/2022   Cancer 04/2022   right breast IDC/DCIS   Chronic idiopathic constipation    GERD (gastroesophageal reflux disease)    Hyperlipidemia    Hypertension    Insomnia    Sleep apnea    wears CPAP nightly   Stage 3 chronic kidney disease    Thyroid disease    Vertigo    Past Surgical History:  Procedure Laterality Date   ABDOMINAL HYSTERECTOMY     BREAST BIOPSY  09/13/2022   MM RT RADIOACTIVE SEED LOC MAMMO GUIDE 09/13/2022 [facility]   BREAST BIOPSY  09/13/2022   MM RT RADIOACTIVE SEED EA ADD LESION LOC MAMMO GUIDE 09/13/2022 [facility]MO[facility]   BREAST LUMPECTOMY WITH RADIOACTIVE SEED AND SENTINEL LYMPH NODE BIOPSY Right 09/14/2022   Procedure: RIGHT BREAST LUMPECTOMY WITH RADIOACTIVE SEED;  Surgeon: Emelia Loron, MD;  Location: [facility] OR;  Service: General;  Laterality: Right;   Left breast cancer     20 years ago   PORT-A-CATH REMOVAL N/A 09/14/2022   Procedure: REMOVAL PORT-A-CATH;  Surgeon: Emelia Loron, MD;  Location: [facility] OR;  Service: General;  Laterality: N/A;   PORTACATH PLACEMENT Right 06/19/2022   Procedure: PORT PLACEMENT WITH ULTRASOUND GUIDANCE;  Surgeon: Emelia Loron, MD;  Location: [facility];  Service: General;  Laterality: Right;   RADIOACTIVE SEED GUIDED AXILLARY SENTINEL LYMPH NODE  Right 09/14/2022   Procedure: Right AXILLARY SENTINEL LYMPH NODE;  Surgeon: Emelia Loron, MD;  Location: [facility] OR;  Service: General;  Laterality: Right;   RADIOACTIVE SEED GUIDED EXCISIONAL BREAST BIOPSY Right 09/14/2022   Procedure: RADIOACTIVE SEED GUIDED EXCISIONAL BREAST BIOPSY;  Surgeon: Emelia Loron, MD;  Location: [facility] OR;  Service: General;  Laterality: Right;   THYROIDECTOMY, PARTIAL     Patient Active Problem List   Diagnosis Date Noted   Status post right breast lumpectomy 09/14/2022   Port-A-Cath in place 07/27/2022   Genetic testing 07/14/2022   Malignant neoplasm of upper-outer quadrant of right breast in female, estrogen receptor negative 06/15/2022   Moderate obstructive sleep apnea 02/17/2022   Snoring 10/04/2021   Allergic rhinitis 10/04/2021   Acute bacterial rhinosinusitis 08/31/2021   Upper airway cough syndrome 08/31/2021   Irritable bowel syndrome 04/11/2021   Impaired fasting glucose 02/12/2020   Keratoconjunctivitis sicca of both eyes not specified as Sjogren's 01/19/2020   Nuclear sclerotic cataract of both eyes 01/19/2020   Posterior vitreous detachment of both eyes 01/19/2020   Class 2 obesity 11/06/2017   Constipation 10/03/2015   Esophageal reflux 10/03/2015   Essential hypertension 10/03/2015   History of breast cancer 10/03/2015   History of IBS 10/03/2015   History of orthostatic hypotension 10/03/2015   Hyperlipemia 10/03/2015  Insomnia 10/03/2015   Pain, joint, multiple sites 10/03/2015   S/P partial thyroidectomy 10/03/2015   Situational anxiety 10/03/2015   Stage 3 chronic kidney disease 10/03/2015   Left lumbar radiculopathy 06/30/2014   Neurogenic claudication 06/30/2014   Other intervertebral disc degeneration, lumbar region 06/30/2014    REFERRING DIAG: right breast cancer at risk for lymphedema  THERAPY DIAG:  Aftercare following surgery for neoplasm  PERTINENT HISTORY: Patient was diagnosed on 06/05/2022 with right grade 3  IDC. It measures 1.9 cm and is located in the upper-outer quadrant. 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 She will be having neoadjuvant chemotherapy starting next week. She had a routine mammogram which was negative, but requested an MRI because of C density breasts. She had a prior left Lumpectomy for DCIS in 2004 . On 09/14/2022 she had a right Lumpectomy with SLNB with 0/1 LN. She will have radiation consult Feb. 14,2024   PRECAUTIONS: right UE Lymphedema risk, None  SUBJECTIVE: Pt returns for her 3 month L-Dex screen.   PAIN:  Are you having pain? No  SOZO SCREENING: Patient was assessed today using the SOZO machine to determine the lymphedema index score. This was compared to her baseline score. It was determined that she is within the recommended range when compared to her baseline and no further action is needed at this time. She will continue SOZO screenings. These are done every 3 months for 2 years post operatively followed by every 6 months for 2 years, and then annually.   L-DEX FLOWSHEETS - 12/18/22 1000       L-DEX LYMPHEDEMA SCREENING   Measurement Type Unilateral    L-DEX MEASUREMENT EXTREMITY Upper Extremity    POSITION  Standing    DOMINANT SIDE Right    At Risk Side Right    BASELINE SCORE (UNILATERAL) 3.6    L-DEX SCORE (UNILATERAL) 8.3    VALUE CHANGE (UNILAT) 4.7              Hermenia Bers, PTA 12/18/2022, 10:22 AM

## 2022-12-19 ENCOUNTER — Other Ambulatory Visit: Payer: Self-pay

## 2022-12-21 ENCOUNTER — Inpatient Hospital Stay: Payer: Medicare Other | Attending: Genetic Counselor | Admitting: Adult Health

## 2022-12-21 ENCOUNTER — Encounter: Payer: Self-pay | Admitting: Adult Health

## 2022-12-21 ENCOUNTER — Other Ambulatory Visit: Payer: Self-pay

## 2022-12-21 VITALS — BP 137/68 | HR 91 | Temp 97.9°F | Resp 16 | Ht 69.0 in | Wt 230.2 lb

## 2022-12-21 DIAGNOSIS — Z171 Estrogen receptor negative status [ER-]: Secondary | ICD-10-CM

## 2022-12-21 DIAGNOSIS — C50411 Malignant neoplasm of upper-outer quadrant of right female breast: Secondary | ICD-10-CM | POA: Diagnosis not present

## 2022-12-21 NOTE — Progress Notes (Signed)
SURVIVORSHIP VISIT:  BRIEF ONCOLOGIC HISTORY:  Oncology History  Malignant neoplasm of upper-outer quadrant of right breast in female, estrogen receptor negative  06/05/2022 Initial Diagnosis   Breast MRI detected 1.9 cm right breast linear enhancement at 12 o'clock position: Biopsy: Grade 3 IDC with high-grade DCIS ER 0% PR 0% Ki-67 60%, HER2 negative, 8 mm periareolar mass: Biopsy fibrocystic change (Prior history 2004 left breast DCIS ER/PR negative)   06/15/2022 Cancer Staging   Staging form: Breast, AJCC 8th Edition - Clinical: Stage IB (cT1c, cN0, cM0, G3, ER-, PR-, HER2-) - Signed by Serena Croissant, MD on 06/15/2022 Stage prefix: Initial diagnosis Histologic grading system: 3 grade system   06/29/2022 -  Chemotherapy   Patient is on Treatment Plan : BREAST Dose Dense AC q14d / CARBOplatin D1 + PACLitaxel D1,8,15 q21d      Genetic Testing   [facility] Common Cancer Panel was Negative. 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 Report date is 06/28/2022.   The Common Hereditary Cancers + RNA Panel offered by [facility] includes sequencing, deletion/duplication, and RNA testing of the following 47 genes: APC, ATM, AXIN2, BARD1, BMPR1A, BRCA1, BRCA2, BRIP1, CDH1, CDK4*, CDKN2A (p14ARF)*, CDKN2A (p16INK4a)*, CHEK2, CTNNA1, DICER1, EPCAM (Deletion/duplication testing only), GREM1 (promoter region deletion/duplication testing only), KIT, MEN1, MLH1, MSH2, MSH3, MSH6, MUTYH, NBN, NF1, NHTL1, PALB2, PDGFRA*, PMS2, POLD1, POLE, PTEN, RAD50, RAD51C, RAD51D, SDHB, SDHC, SDHD, SMAD4, SMARCA4. STK11, TP53, TSC1, TSC2, and VHL.  The following genes were evaluated for sequence changes only: SDHA and HOXB13 c.251G>A variant only.  RNA analysis is not performed for the * genes.     09/15/2022 Surgery   Right lumpectomy: Grade 3 IDC 2.5 mm DCIS grade 3, margins negative, 0/1 lymph nodes negative   10/26/2022 - 11/22/2022 Radiation Therapy   Right Breast: 42.56 Gy in  16 treatments "Boost": 8 Gy in 4 treatments      INTERVAL HISTORY:  [name] to review her survivorship care plan detailing her treatment course for breast cancer, as well as monitoring long-term side effects of that treatment, education regarding health maintenance, screening, and overall wellness and health promotion.     Overall, [name] reports feeling quite well   REVIEW OF SYSTEMS:  Review of Systems  Constitutional:  Negative for appetite change, chills, fatigue, fever and unexpected weight change.  HENT:   Negative for hearing loss, lump/mass and trouble swallowing.   Eyes:  Negative for eye problems and icterus.  Respiratory:  Negative for chest tightness, cough and shortness of breath.   Cardiovascular:  Negative for chest pain, leg swelling and palpitations.  Gastrointestinal:  Negative for abdominal distention, abdominal pain, constipation, diarrhea, nausea and vomiting.  Endocrine: Negative for hot flashes.  Genitourinary:  Negative for difficulty urinating.   Musculoskeletal:  Negative for arthralgias.  Skin:  Negative for itching and rash.  Neurological:  Negative for dizziness, extremity weakness, headaches and numbness.  Hematological:  Negative for adenopathy. Does not bruise/bleed easily.  Psychiatric/Behavioral:  Negative for depression. The patient is not nervous/anxious.    Breast: Denies any new nodularity, masses, tenderness, nipple changes, or nipple discharge.    PAST MEDICAL/SURGICAL HISTORY:  Past Medical History:  Diagnosis Date   Allergic rhinitis    Allergy    Anemia    Anxiety    Arthritis    Breast cancer 06/05/2022   Cancer 04/2022   right breast IDC/DCIS   Chronic idiopathic constipation    GERD (gastroesophageal reflux disease)  Hyperlipidemia    Hypertension    Insomnia    Sleep apnea    wears CPAP nightly   Stage 3 chronic kidney disease    Thyroid disease    Vertigo    Past Surgical History:  Procedure Laterality Date    ABDOMINAL HYSTERECTOMY     BREAST BIOPSY  09/13/2022   MM RT RADIOACTIVE SEED LOC MAMMO GUIDE 09/13/2022 [facility] MAMMOGRAP[facility]   BREAST BIOPSY  09/13/2022   MM RT RADIOACTIVE SEED EA ADD LESION LOC MAMMO GUIDE 09/13/2022 [facility] MAMMOGRAP[facility]   BREAST LUMPECTOMY WITH RADIOACTIVE SEED AND SENTINEL LYMPH NODE BIOPSY Right 09/14/2022   Procedure: RIGHT BREAST LUMPECTOMY WITH RADIOACTIVE SEED;  Surgeon: Emelia Loron, MD;  Location: [facility] OR;  Service: General;  Laterality: Right;   Left breast cancer     20 years ago   PORT-A-CATH REMOVAL N/A 09/14/2022   Procedure: REMOVAL PORT-A-CATH;  Surgeon: Emelia Loron, MD;  Location: [facility] OR;  Service: General;  Laterality: N/A;   PORTACATH PLACEMENT Right 06/19/2022   Procedure: PORT PLACEMENT WITH ULTRASOUND GUIDANCE;  Surgeon: Emelia Loron, MD;  Location: [facility];  Service: General;  Laterality: Right;   RADIOACTIVE SEED GUIDED AXILLARY SENTINEL LYMPH NODE Right 09/14/2022   Procedure: Right AXILLARY SENTINEL LYMPH NODE;  Surgeon: Emelia Loron, MD;  Location: [facility] OR;  Service: General;  Laterality: Right;   RADIOACTIVE SEED GUIDED EXCISIONAL BREAST BIOPSY Right 09/14/2022   Procedure: RADIOACTIVE SEED GUIDED EXCISIONAL BREAST BIOPSY;  Surgeon: Emelia Loron, MD;  Location: [facility] OR;  Service: General;  Laterality: Right;   THYROIDECTOMY, PARTIAL       ALLERGIES:  Allergies  Allergen Reactions   Amoxicillin Shortness Of Breath   Esomeprazole Magnesium Diarrhea   Rabeprazole Diarrhea     CURRENT MEDICATIONS:  Outpatient Encounter Medications as of 12/21/2022  Medication Sig   amLODipine (NORVASC) 10 MG tablet Take 10 mg by mouth daily.   gabapentin (NEURONTIN) 100 MG capsule Take 100 mg by mouth daily as needed (pain). PRN for back pain   hydrochlorothiazide (HYDRODIURIL) 25 MG tablet Take 25 mg by mouth daily.   hydrOXYzine (ATARAX) 25 MG tablet Take 1 tablet (25 mg total) by mouth 3 (three) times daily as needed.    metoprolol succinate (TOPROL-XL) 50 MG 24 hr tablet Take 1 tablet (50 mg total) by mouth daily.   montelukast (SINGULAIR) 10 MG tablet Take 1 tablet (10 mg total) by mouth daily.   Multiple Vitamin (MULTI-VITAMIN) tablet Take 1 tablet by mouth daily.   Omega-3 Fatty Acids (FISH OIL) 1000 MG CAPS Take 1,000 mg by mouth daily.   omeprazole (PRILOSEC) 20 MG capsule Take 1 capsule (20 mg total) by mouth daily.   rosuvastatin (CRESTOR) 5 MG tablet Take 1 tablet (5 mg total) by mouth daily.   vitamin B-12 (CYANOCOBALAMIN) 500 MCG tablet Take 500 mcg by mouth daily.   No facility-administered encounter medications on file as of 12/21/2022.     ONCOLOGIC FAMILY HISTORY:  Family History  Problem Relation Age of Onset   Breast cancer Paternal Aunt    Lung cancer Paternal Uncle        he smoked   Lung cancer Paternal Uncle        he smoked   Colon cancer Neg Hx    Pancreatic cancer Neg Hx    Esophageal cancer Neg Hx    Stomach cancer Neg Hx    Rectal cancer Neg Hx     SOCIAL HISTORY:  Social History  Socioeconomic History   Marital status: Married    Spouse name: Not on file   Number of children: Not on file   Years of education: Not on file   Highest education level: Not on file  Occupational History   Not on file  Tobacco Use   Smoking status: Never   Smokeless tobacco: Never  Vaping Use   Vaping Use: Never used  Substance and Sexual Activity   Alcohol use: Yes    Comment: occasional wine   Drug use: Never   Sexual activity: Yes    Birth control/protection: Surgical    Comment: hyst  Other Topics Concern   Not on file  Social History Narrative   She is married, she has 1 son and 1 daughter they are twins and 4 grandchildren.      She is retired from [employer] having done many jobs there.      No alcohol 1 caffeinated beverage a day never smoker and no tobacco.   Social Determinants of Health   Financial Resource Strain: Low Risk  (11/28/2022)    Overall Financial Resource Strain (CARDIA)    Difficulty of Paying Living Expenses: Not hard at all  Food Insecurity: No Food Insecurity (11/28/2022)   Hunger Vital Sign    Worried About Running Out of Food in the Last Year: Never true    Ran Out of Food in the Last Year: Never true  Transportation Needs: No Transportation Needs (11/28/2022)   PRAPARE - Administrator, Civil Service (Medical): No    Lack of Transportation (Non-Medical): No  Physical Activity: Insufficiently Active (11/28/2022)   Exercise Vital Sign    Days of Exercise per Week: 5 days    Minutes of Exercise per Session: 20 min  Stress: No Stress Concern Present (11/28/2022)   [employer] of Occupational Health - Occupational Stress Questionnaire    Feeling of Stress : Only a little  Social Connections: Unknown (11/28/2022)   Social Connection and Isolation Panel [NHANES]    Frequency of Communication with Friends and Family: More than three times a week    Frequency of Social Gatherings with Friends and Family: More than three times a week    Attends Religious Services: Not on file    Active Member of Clubs or Organizations: Yes    Attends Banker Meetings: More than 4 times per year    Marital Status: Married  Catering manager Violence: Not At Risk (11/29/2022)   Humiliation, Afraid, Rape, and Kick questionnaire    Fear of Current or Ex-Partner: No    Emotionally Abused: No    Physically Abused: No    Sexually Abused: No     OBSERVATIONS/OBJECTIVE:  BP 137/68 (BP Location: Left Arm, Patient Position: Sitting)   Pulse 91   Temp 97.9 F (36.6 C) (Temporal)   Resp 16   Ht  (1.753 m)   Wt 230 lb 3.2 oz (104.4 kg)   SpO2 98%   BMI 33.99 kg/m  GENERAL: Patient is a well appearing female in no acute distress HEENT:  Sclerae anicteric.  Oropharynx clear and moist. No ulcerations or evidence of oropharyngeal candidiasis. Neck is supple.  NODES:  No cervical, supraclavicular, or axillary  lymphadenopathy palpated.  BREAST EXAM: Right breast postlumpectomy and radiation no sign of local recurrence left breast is benign. LUNGS:  Clear to auscultation bilaterally.  No wheezes or rhonchi. HEART:  Regular rate and rhythm. No murmur appreciated. ABDOMEN:  Soft, nontender.  Positive, normoactive bowel sounds. No organomegaly palpated. MSK:  No focal spinal tenderness to palpation. Full range of motion bilaterally in the upper extremities. EXTREMITIES:  No peripheral edema.   SKIN:  Clear with no obvious rashes or skin changes. No nail dyscrasia. NEURO:  Nonfocal. Well oriented.  Appropriate affect.  LABORATORY DATA:  None for this visit.  DIAGNOSTIC IMAGING:  None for this visit.      ASSESSMENT AND PLAN:  Karina Ferguson is a pleasant 67 y.o. female with Stage IB right breast invasive ductal carcinoma, ER-/PR-/HER2-, diagnosed in 05/2022, treated with neoadjuvant chemotherapy,  lumpectomy, and adjuvant radiation therapy.  She presents to the Survivorship Clinic for our initial meeting and routine follow-up post-completion of treatment for breast cancer.    1. Stage IB right breast cancer:  Karina Ferguson is continuing to recover from definitive treatment for breast cancer. She will follow-up with her medical oncologist, Dr. Pamelia Hoit in 6 months with history and physical exam per surveillance protocol.  Her mammogram is due 05/2023; orders placed today. Today, a comprehensive survivorship care plan and treatment summary was reviewed with the patient today detailing her breast cancer diagnosis, treatment course, potential late/long-term effects of treatment, appropriate follow-up care with recommendations for the future, and patient education resources.  A copy of this summary, along with a letter will be sent to the patient's primary care provider via mail/fax/In Basket message after today's visit.    2. Anxiety: She and I discussed anti anxiety medications, their risks, and benefits.  She is  interested in a PRN medication and will f/u with her PCP about this.  I recommended that she exercise daily as that can a lot of times help with anxiety.    3. Bone health:  She was given education on specific activities to promote bone health.  4. Cancer screening:  Due to Ms. Couvillon's history and her age, she should receive screening for skin cancers, colon cancer.  The information and recommendations are listed on the patient's comprehensive care plan/treatment summary and were reviewed in detail with the patient.    5. Health maintenance and wellness promotion: Ms. Vieyra was encouraged to consume 5-7 servings of fruits and vegetables per day. We reviewed the "Nutrition Rainbow" handout.  She was also encouraged to engage in moderate to vigorous exercise for 30 minutes per day most days of the week.  She was instructed to limit her alcohol consumption and continue to abstain from tobacco use.     6. Support services/counseling: It is not uncommon for this period of the patient's cancer care trajectory to be one of many emotions and stressors.  She was given information regarding our available services and encouraged to contact me with any questions or for help enrolling in any of our support group/programs.    Follow up instructions:    -Return to cancer center in 6 months for f/u with Dr. Pamelia Hoit  -Mammogram due in 05/2022 -She is welcome to return back to the Survivorship Clinic at any time; no additional follow-up needed at this time.  -Consider referral back to survivorship as a long-term survivor for continued surveillance  The patient was provided an opportunity to ask questions and all were answered. The patient agreed with the plan and demonstrated an understanding of the instructions.   Total encounter time:45 minutes*in face-to-face visit time, chart review, lab review, care coordination, order entry, and documentation of the encounter time.    Lillard Anes, NP 12/21/22 1:52  PM Medical Oncology and Hematology  [facility] [address] [place], [place] [address] Tel. [phone]    Fax. [phone]  *Total Encounter Time as defined by the Centers for Medicare and Medicaid Services includes, in addition to the face-to-face time of a patient visit (documented in the note above) non-face-to-face time: obtaining and reviewing outside history, ordering and reviewing medications, tests or procedures, care coordination (communications with other health care professionals or caregivers) and documentation in the medical record.

## 2022-12-22 ENCOUNTER — Telehealth: Payer: Self-pay | Admitting: Adult Health

## 2022-12-22 NOTE — Telephone Encounter (Signed)
Scheduled appointment per 4/25 los. Patient is aware of the made appointment. 

## 2022-12-23 ENCOUNTER — Other Ambulatory Visit: Payer: Self-pay

## 2023-01-08 ENCOUNTER — Ambulatory Visit
Admission: RE | Admit: 2023-01-08 | Discharge: 2023-01-08 | Disposition: A | Discharge status: Home / Self Care | Payer: Medicare Other | Source: Ambulatory Visit | Attending: Radiation Oncology | Admitting: Radiation Oncology

## 2023-01-08 NOTE — Progress Notes (Signed)
  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   Symptoms of fatigue have improved since completing therapy.  Symptoms of skin changes have improved since completing therapy.  The patient was encouraged to avoid sun exposure in the area of prior treatment for up to one year following radiation with either sunscreen or by the style of clothing worn in the sun.  The patient has scheduled follow up with her medical oncologist Dr. Pamelia Hoit for ongoing surveillance, and was encouraged to call if she develops concerns or questions regarding radiation.  This concludes the interview.   Ruel Favors, LPN

## 2023-01-09 ENCOUNTER — Other Ambulatory Visit: Payer: Self-pay | Admitting: Gastroenterology

## 2023-01-09 DIAGNOSIS — R1084 Generalized abdominal pain: Secondary | ICD-10-CM

## 2023-01-12 ENCOUNTER — Other Ambulatory Visit (HOSPITAL_BASED_OUTPATIENT_CLINIC_OR_DEPARTMENT_OTHER): Payer: Self-pay | Admitting: Gastroenterology

## 2023-01-12 DIAGNOSIS — R1084 Generalized abdominal pain: Secondary | ICD-10-CM

## 2023-01-16 ENCOUNTER — Encounter: Payer: Self-pay | Admitting: Hematology and Oncology

## 2023-01-17 ENCOUNTER — Ambulatory Visit (HOSPITAL_BASED_OUTPATIENT_CLINIC_OR_DEPARTMENT_OTHER): Admission: RE | Admit: 2023-01-17 | Payer: Medicare Other | Source: Ambulatory Visit

## 2023-01-18 ENCOUNTER — Encounter: Payer: Self-pay | Admitting: Family Medicine

## 2023-01-18 ENCOUNTER — Ambulatory Visit (INDEPENDENT_AMBULATORY_CARE_PROVIDER_SITE_OTHER): Payer: Medicare Other | Admitting: Family Medicine

## 2023-01-18 VITALS — BP 130/87 | HR 82 | Temp 98.3°F | Ht 69.0 in | Wt 230.0 lb

## 2023-01-18 DIAGNOSIS — J40 Bronchitis, not specified as acute or chronic: Secondary | ICD-10-CM | POA: Diagnosis not present

## 2023-01-18 DIAGNOSIS — J329 Chronic sinusitis, unspecified: Secondary | ICD-10-CM

## 2023-01-18 MED ORDER — HYDROCOD POLI-CHLORPHE POLI ER 10-8 MG/5ML PO SUER
5.0000 mL | Freq: Two times a day (BID) | ORAL | 0 refills | Status: AC | PRN
Start: 2023-01-18 — End: 2023-01-23

## 2023-01-18 MED ORDER — AZITHROMYCIN 250 MG PO TABS
ORAL_TABLET | ORAL | 0 refills | Status: AC
Start: 2023-01-18 — End: 2023-01-23

## 2023-01-18 MED ORDER — ALBUTEROL SULFATE HFA 108 (90 BASE) MCG/ACT IN AERS
1.0000 | INHALATION_SPRAY | Freq: Four times a day (QID) | RESPIRATORY_TRACT | 3 refills | Status: AC | PRN
Start: 2023-01-18 — End: ?

## 2023-01-18 NOTE — Patient Instructions (Addendum)
Refilling your albuterol to use as needed for wheezing or shortness of breath Adding Zpak  and cough syrup.  Continue supportive measures including rest, hydration, humidifier use, steam showers, warm compresses to sinuses, warm liquids with lemon and honey, and over-the-counter cough, cold, and analgesics as needed.   The following information is provided as a Counsellor for ADULT patients only and does NOT take into account PREGNANCY, ALLERGIES, LIVER CONDITIONS, KIDNEY CONDITIONS, GASTROINTESTINAL CONDITIONS, OR PRESCRIPTION MEDICATION INTERACTIONS. Please be sure to ask your provider if the following are safe to take with your specific medical history, conditions, or current medication regimen if you are unsure.   Adult Basic Symptom Management for Sinusitis  Congestion: Guaifenesin (Mucinex)- follow directions on packaging with a maximum dose of 2400mg  in a 24 hour period.  Pain/Fever: Ibuprofen 200mg  - 400mg  every 4-6 hours as needed (MAX 1200mg  in a 24 hour period) Pain/Fever: Tylenol 500mg  -1000mg  every 6-8 hours as needed (MAX 3000mg  in a 24 hour period)  Cough: Dextromethorphan (Delsym)- follow directions on packing with a maximum dose of 120mg  in a 24 hour period.  Nasal Stuffiness: Saline nasal spray and/or Nettie Pot with sterile saline solution  Runny Nose: Fluticasone nasal spray (Flonase) OR Mometasone nasal spray (Nasonex) OR Triamcinolone Acetonide nasal spray (Nasacort)- follow directions on the packaging  Pain/Pressure: Warm washcloth to the face  Sore Throat: Warm salt water gargles  If you have allergies, you may also consider taking an oral antihistamine (like Zyrtec or Claritin) as these may also help with your symptoms.  **Many medications will have more than one ingredient, be sure you are reading the packaging carefully and not taking more than one dose of the same kind of medication at the same time or too close together. It is OK to use formulas that have  all of the ingredients you want, but do not take them in a combined medication and as separate dose too close together. If you have any questions, the pharmacist will be happy to help you decide what is safe.

## 2023-01-18 NOTE — Progress Notes (Signed)
Acute Office Visit  Subjective:     Patient ID: Karina Ferguson, female    DOB: Nov 13, 1955, 67 y.o.   MRN: [id number]  Chief Complaint  Patient presents with   Cough     Patient is in today for upper respiratory symptoms.    Patient states that she has been having URI symptoms for the past 2 weeks. She started feeling better a few days ago, but then symptoms worsened again. Symptoms have included sore throat, rhinorrhea, productive cough, sinus pressure, headache, body aches, occasional wheezing. States several of her close contacts have had similar symptoms as well. She denies fevers, chills, chest pain, dyspnea, nausea, vomiting, diarrhea.       All review of systems negative except what is listed in the HPI      Objective:    BP 130/87   Pulse 82   Temp 98.3 F (36.8 C) (Oral)   Ht 5\' 9"  (1.753 m)   Wt 230 lb (104.3 kg)   SpO2 100%   BMI 33.97 kg/m    Physical Exam Vitals reviewed.  Constitutional:      Appearance: Normal appearance.  Cardiovascular:     Rate and Rhythm: Normal rate and regular rhythm.     Pulses: Normal pulses.     Heart sounds: Normal heart sounds.  Pulmonary:     Effort: Pulmonary effort is normal.     Breath sounds: Normal breath sounds.  Skin:    General: Skin is warm and dry.  Neurological:     Mental Status: She is alert and oriented to person, place, and time.  Psychiatric:        Mood and Affect: Mood normal.        Behavior: Behavior normal.        Thought Content: Thought content normal.        Judgment: Judgment normal.     No results found for any visits on 01/18/23.      Assessment & Plan:   Problem List Items Addressed This Visit   None Visit Diagnoses     Sinobronchitis    -  Primary   Relevant Medications   albuterol (VENTOLIN HFA) 108 (90 Base) MCG/ACT inhaler   azithromycin (ZITHROMAX) 250 MG tablet   chlorpheniramine-HYDROcodone (TUSSIONEX) 10-8 MG/5ML     Refilling your albuterol to use as needed for  wheezing or shortness of breath Adding Zpak  and cough syrup.  Continue supportive measures including rest, hydration, humidifier use, steam showers, warm compresses to sinuses, warm liquids with lemon and honey, and over-the-counter cough, cold, and analgesics as needed.        Meds ordered this encounter  Medications   albuterol (VENTOLIN HFA) 108 (90 Base) MCG/ACT inhaler    Sig: Inhale 1-2 puffs into the lungs every 6 (six) hours as needed.    Dispense:  18 g    Refill:  3    Order Specific Question:   Supervising Provider    Answer:   Danise Edge A [[id number]]   azithromycin (ZITHROMAX) 250 MG tablet    Sig: Take 2 tablets on day 1, then 1 tablet daily on days 2 through 5    Dispense:  6 tablet    Refill:  0    Order Specific Question:   Supervising Provider    Answer:   Danise Edge A [[id number]]   chlorpheniramine-HYDROcodone (TUSSIONEX) 10-8 MG/5ML    Sig: Take 5 mLs by mouth every 12 (twelve) hours as needed for up to  5 days.    Dispense:  50 mL    Refill:  0    Order Specific Question:   Supervising Provider    Answer:   Danise Edge A [[id number]]    Return if symptoms worsen or fail to improve.  Clayborne Dana, NP

## 2023-01-24 ENCOUNTER — Ambulatory Visit (HOSPITAL_BASED_OUTPATIENT_CLINIC_OR_DEPARTMENT_OTHER)
Admission: RE | Admit: 2023-01-24 | Discharge: 2023-01-24 | Disposition: A | Discharge status: Home / Self Care | Payer: Medicare Other | Source: Ambulatory Visit | Attending: Gastroenterology | Admitting: Gastroenterology

## 2023-01-24 ENCOUNTER — Encounter (HOSPITAL_BASED_OUTPATIENT_CLINIC_OR_DEPARTMENT_OTHER): Payer: Self-pay

## 2023-01-24 DIAGNOSIS — R1084 Generalized abdominal pain: Secondary | ICD-10-CM | POA: Insufficient documentation

## 2023-01-24 MED ORDER — IOHEXOL 300 MG/ML  SOLN
100.0000 mL | Freq: Once | INTRAMUSCULAR | Status: AC | PRN
  Administered 2023-01-24: 100 mL via INTRAVENOUS

## 2023-03-06 ENCOUNTER — Emergency Department (HOSPITAL_BASED_OUTPATIENT_CLINIC_OR_DEPARTMENT_OTHER)
Admission: EM | Admit: 2023-03-06 | Discharge: 2023-03-06 | Disposition: A | Discharge status: Home / Self Care | Payer: Medicare Other | Source: Home / Self Care | Attending: Emergency Medicine | Admitting: Emergency Medicine

## 2023-03-06 ENCOUNTER — Encounter (HOSPITAL_BASED_OUTPATIENT_CLINIC_OR_DEPARTMENT_OTHER): Payer: Self-pay

## 2023-03-06 ENCOUNTER — Other Ambulatory Visit: Payer: Self-pay

## 2023-03-06 DIAGNOSIS — R059 Cough, unspecified: Secondary | ICD-10-CM | POA: Diagnosis present

## 2023-03-06 DIAGNOSIS — U071 COVID-19: Secondary | ICD-10-CM | POA: Insufficient documentation

## 2023-03-06 LAB — RESP PANEL BY RT-PCR (RSV, FLU A&B, COVID)  RVPGX2
Influenza A by PCR: NEGATIVE
Influenza B by PCR: NEGATIVE
Resp Syncytial Virus by PCR: NEGATIVE
SARS Coronavirus 2 by RT PCR: POSITIVE — AB

## 2023-03-06 LAB — GROUP A STREP BY PCR: Group A Strep by PCR: NOT DETECTED

## 2023-03-06 MED ORDER — ONDANSETRON 4 MG PO TBDP: ORAL_TABLET | ORAL | 0 refills | Status: DC

## 2023-03-06 MED ORDER — ACETAMINOPHEN 325 MG PO TABS
650.0000 mg | ORAL_TABLET | Freq: Once | ORAL | Status: AC | PRN
  Administered 2023-03-06: 650 mg via ORAL
  Filled 2023-03-06: qty 2

## 2023-03-06 MED ORDER — HYDROCODONE BIT-HOMATROP MBR 5-1.5 MG/5ML PO SOLN: 5.0000 mL | Freq: Four times a day (QID) | ORAL | 0 refills | Status: DC | PRN

## 2023-03-06 NOTE — ED Provider Notes (Signed)
[facility] Provider Note   CSN: [id number] Arrival date & time: 03/06/23  2136     History  Chief Complaint  Patient presents with   Cough    Karina Ferguson is a 67 y.o. female.  67 yo F with a chief complaints of fever cough and congestion.  This started just today.  She thinks she really noticed that about lunchtime.  Her husband has been sick for the better part of the week.  She took a home COVID test and it was positive.  She had him then take 1 which was also positive.  They then came here to be formally tested.   Cough      Home Medications Prior to Admission medications   Medication Sig Start Date End Date Taking? Authorizing Provider  HYDROcodone bit-homatropine (HYCODAN) 5-1.5 MG/5ML syrup Take 5 mLs by mouth every 6 (six) hours as needed for cough. 03/06/23  Yes Melene Plan, DO  ondansetron (ZOFRAN-ODT) 4 MG disintegrating tablet 4mg  ODT q4 hours prn nausea/vomit 03/06/23  Yes Melene Plan, DO  albuterol (VENTOLIN HFA) 108 (90 Base) MCG/ACT inhaler Inhale 1-2 puffs into the lungs every 6 (six) hours as needed. 01/18/23   Clayborne Dana, NP  amLODipine (NORVASC) 10 MG tablet Take 10 mg by mouth daily. 02/12/20   [provider]  cholecalciferol (VITAMIN D3) 25 MCG (1000 UNIT) tablet Take 1,000 Units by mouth daily. 10/03/15   [provider]  gabapentin (NEURONTIN) 100 MG capsule Take 100 mg by mouth daily as needed (pain). PRN for back pain 02/13/20   [provider]  hydrochlorothiazide (HYDRODIURIL) 25 MG tablet Take 25 mg by mouth daily. 11/03/20   [provider]  metoprolol succinate (TOPROL-XL) 50 MG 24 hr tablet Take 1 tablet (50 mg total) by mouth daily. 09/28/22   Clayborne Dana, NP  montelukast (SINGULAIR) 10 MG tablet Take 1 tablet (10 mg total) by mouth daily. 09/28/22   Clayborne Dana, NP  Multiple Vitamin (MULTI-VITAMIN) tablet Take 1 tablet by mouth daily.    [provider]  Omega-3  Fatty Acids (FISH OIL) 1000 MG CAPS Take 1,000 mg by mouth daily.    [provider]  omeprazole (PRILOSEC) 20 MG capsule Take 1 capsule (20 mg total) by mouth daily. 09/28/22   Clayborne Dana, NP  rosuvastatin (CRESTOR) 5 MG tablet Take 1 tablet (5 mg total) by mouth daily. 10/30/22   Clayborne Dana, NP  vitamin B-12 (CYANOCOBALAMIN) 500 MCG tablet Take 500 mcg by mouth daily.    [provider]      Allergies    Amoxicillin, Esomeprazole magnesium, and Rabeprazole    Review of Systems   Review of Systems  Respiratory:  Positive for cough.     Physical Exam Updated Vital Signs BP (!) 167/85 (BP Location: Right Arm)   Pulse (!) 109   Temp (!) 102.3 F (39.1 C) (Oral)   Resp 19   Ht 5\' 9"  (1.753 m)   Wt 102.1 kg   SpO2 100%   BMI 33.23 kg/m  Physical Exam Vitals and nursing note reviewed.  Constitutional:      General: She is not in acute distress.    Appearance: She is well-developed. She is not diaphoretic.  HENT:     Head: Normocephalic and atraumatic.     Comments: Swollen turbinates, posterior nasal drip, no noted sinus ttp, tm normal bilaterally.   Eyes:     Pupils:  Pupils are equal, round, and reactive to light.  Cardiovascular:     Rate and Rhythm: Normal rate and regular rhythm.     Heart sounds: No murmur heard.    No friction rub. No gallop.  Pulmonary:     Effort: Pulmonary effort is normal.     Breath sounds: No wheezing or rales.  Abdominal:     General: There is no distension.     Palpations: Abdomen is soft.     Tenderness: There is no abdominal tenderness.  Musculoskeletal:        General: No tenderness.     Cervical back: Normal range of motion and neck supple.  Skin:    General: Skin is warm and dry.  Neurological:     Mental Status: She is alert and oriented to person, place, and time.  Psychiatric:        Behavior: Behavior normal.     ED Results / Procedures / Treatments   Labs (all labs ordered are listed, but only  abnormal results are displayed) Labs Reviewed  RESP PANEL BY RT-PCR (RSV, FLU A&B, COVID)  [id number]  GROUP A STREP BY PCR    EKG None  Radiology No results found.  Procedures Procedures    Medications Ordered in ED Medications  acetaminophen (TYLENOL) tablet 650 mg (650 mg Oral Given 03/06/23 2205)    ED Course/ Medical Decision Making/ A&P                             Medical Decision Making Risk OTC drugs. Prescription drug management.   67 yo F with a chief complaint of cough and congestion and fever.  Going on for about 12 hours.  She took a home COVID test that was positive.  I had a long discussion with her about how that means that she has COVID but she is insisting on having a test done here.  I discussed risks and benefits of Paxlovid therapy and she is currently declining.   She is well-appearing nontoxic.  No bacterial source of infection found on exam.  Clear lung sounds without tachypnea feel no benefit in chest imaging.  She is asking for narcotic cough syrup.  Will have her follow-up with her family doctor in the office.  10:14 PM:  I have discussed the diagnosis/risks/treatment options with the patient.  Evaluation and diagnostic testing in the emergency department does not suggest an emergent condition requiring admission or immediate intervention beyond what has been performed at this time.  They will follow up with PCP. We also discussed returning to the ED immediately if new or worsening sx occur. We discussed the sx which are most concerning (e.g., sudden worsening pain, fever, inability to tolerate by mouth) that necessitate immediate return. Medications administered to the patient during their visit and any new prescriptions provided to the patient are listed below.  Medications given during this visit Medications  acetaminophen (TYLENOL) tablet 650 mg (650 mg Oral Given 03/06/23 2205)     The patient appears reasonably screen and/or stabilized for  discharge and I doubt any other medical condition or other [facility] requiring further screening, evaluation, or treatment in the ED at this time prior to discharge.          Final Clinical Impression(s) / ED Diagnoses Final diagnoses:  COVID-19 virus infection    Rx / DC Orders ED Discharge Orders          Ordered  HYDROcodone bit-homatropine (HYCODAN) 5-1.5 MG/5ML syrup  Every 6 hours PRN        03/06/23 2210    ondansetron (ZOFRAN-ODT) 4 MG disintegrating tablet        03/06/23 2210              Melene Plan, DO 03/06/23 2214

## 2023-03-06 NOTE — Discharge Instructions (Signed)
Take tylenol 2 pills 4 times a day and motrin 4 pills 3 times a day.  Drink plenty of fluids.  Return for worsening shortness of breath, headache, confusion. Follow up with your family doctor.   

## 2023-03-06 NOTE — ED Triage Notes (Signed)
Pt reports she started feeling bad today and reports nonproductive cough, chills, sneezing, sore throat. Denies CP/SOB.

## 2023-03-07 ENCOUNTER — Telehealth (INDEPENDENT_AMBULATORY_CARE_PROVIDER_SITE_OTHER): Payer: Medicare Other | Admitting: Family Medicine

## 2023-03-07 ENCOUNTER — Telehealth: Payer: Self-pay | Admitting: Family Medicine

## 2023-03-07 ENCOUNTER — Encounter: Payer: Self-pay | Admitting: Family Medicine

## 2023-03-07 DIAGNOSIS — U071 COVID-19: Secondary | ICD-10-CM | POA: Diagnosis not present

## 2023-03-07 MED ORDER — PREDNISONE 20 MG PO TABS
20.0000 mg | ORAL_TABLET | Freq: Every day | ORAL | 0 refills | Status: AC
Start: 2023-03-07 — End: 2023-03-12

## 2023-03-07 NOTE — Telephone Encounter (Signed)
Please advise 

## 2023-03-07 NOTE — Telephone Encounter (Signed)
Gave patient advise. Was not happy with this answer. Wants something to treat Covid. Declined Paxlovid at appt yesterday. Appt made to discuss with Ladona Ridgel (telephone visit).

## 2023-03-07 NOTE — Telephone Encounter (Signed)
Recommend Mucinex twice a day and staying well hydrated to thin/break up the mucus. Continue supportive measures including rest, hydration, humidifier use, steam showers, warm liquids with lemon and honey

## 2023-03-07 NOTE — Telephone Encounter (Signed)
Pt called stating that she had a visit to the ED yesterday (7.10.24) and they had determined that she was COVID+. Pt stated she was prescribed Hydrocodone to help with the cough and asked to take tylenol as an anti inflammatory but she was not prescribed anything to help with breaking up the respiratory symptoms she is having. Pt would like to look into seeing if there was anything else that Ladona Ridgel would recommend to help with this.

## 2023-03-07 NOTE — Progress Notes (Signed)
Virtual Video Visit via MyChart Note  I connected with  Karina Ferguson on 03/07/23 at  2:20 PM EDT by the video enabled telemedicine application for MyChart, and verified that I am speaking with the correct person using two identifiers.   I introduced myself as a Publishing rights manager with the practice. We discussed the limitations of evaluation and management by telemedicine and the availability of in person appointments. The patient expressed understanding and agreed to proceed.  Participating parties in this visit include: The patient and the nurse practitioner listed.  The patient is: At home I am: In the office - [facility]  Subjective:    CC:  Chief Complaint  Patient presents with   Covid Positive    HPI: Karina Ferguson is a 67 y.o. year old female presenting today via MyChart today for COVID.  Discussed the use of AI scribe software for clinical note transcription with the patient, who gave verbal consent to proceed.  History of Present Illness   The patient, recently diagnosed with COVID-19, presented with symptoms starting the day prior to the consultation. They initially experienced symptoms similar to a cold, which prompted a home COVID-19 test that returned positive. The patient's symptoms include a mild headache, nasal congestion, and a feeling of weakness. They also reported a runny nose and compared their current symptoms to a previous respiratory infection they had in May.  The patient's spouse also started feeling unwell a few days prior, but their initial COVID-19 test was negative. However, upon the patient's insistence, they both took another test which confirmed they were both positive for COVID-19.  The patient expressed concerns about their symptoms being indicative of a bacterial respiratory infection, similar to their previous experience. They were interested in starting antibiotics as a precautionary measure, but understood the need to  wait and see if symptoms persist beyond a week.  The patient was not experiencing any breathing difficulties or wheezing. They were managing their symptoms with over-the-counter medications, including Mucinex Max for sinus and congestion relief. They also had Flonase nasal spray at home.  The patient's blood pressure was slightly elevated during their recent visit to the emergency department, which they attributed to not feeling well. Their kidney function was last checked in March and was reported to be normal.  The patient had plans to travel but was considering rescheduling due to their current health condition.         Past medical history, Surgical history, Family history not pertinant except as noted below, Social history, Allergies, and medications have been entered into the medical record, reviewed, and corrections made.   Review of Systems:  All review of systems negative except what is listed in the HPI   Objective:    General:  Speaking clearly in complete sentences. Absent shortness of breath noted.   Alert and oriented x3.   Normal judgment.  Absent acute distress.   Impression and Recommendations:    1. COVID-19 - predniSONE (DELTASONE) 20 MG tablet; Take 1 tablet (20 mg total) by mouth daily with breakfast for 5 days.  Dispense: 5 tablet; Refill: 0   COVID-19: Positive test result with symptoms of headache, congestion, and weakness. No respiratory distress. Discussed the use of antivirals (Paxlovid, molnupiravir) but decided against it due to mild symptoms and potential side effects. Also discussed the possibility of secondary bacterial infection if symptoms persist. -Continue supportive measures (fluids, rest, over-the-counter medications). -Start Prednisone 20mg  for 5 days to alleviate sinus pressure. -  Contact provider if symptoms persist or worsen by 03/12/2023 for potential antibiotic treatment. -Advised to seek immediate medical attention if experiencing  trouble breathing, chest pain, or high fevers.         Follow-up if symptoms worsen or fail to improve.    I discussed the assessment and treatment plan with the patient. The patient was provided an opportunity to ask questions and all were answered. The patient agreed with the plan and demonstrated an understanding of the instructions.   The patient was advised to call back or seek an in-person evaluation if the symptoms worsen or if the condition fails to improve as anticipated.   Clayborne Dana, NP

## 2023-03-09 ENCOUNTER — Other Ambulatory Visit: Payer: Self-pay | Admitting: Family Medicine

## 2023-03-09 DIAGNOSIS — I10 Essential (primary) hypertension: Secondary | ICD-10-CM

## 2023-03-12 ENCOUNTER — Telehealth: Payer: Self-pay | Admitting: Family Medicine

## 2023-03-12 NOTE — Telephone Encounter (Signed)
Pt called stating that she is still congested and would like a z pak sent to her pharmacy when possible

## 2023-03-12 NOTE — Telephone Encounter (Signed)
Patient seen 03/07/2023 - Covid positive. Was told numerous times she did not need an antibiotic. Karina Ferguson - please review.

## 2023-03-12 NOTE — Telephone Encounter (Signed)
Tried to call to check on her, but no answer. We had previous discussion about COVID being a viral infection and needing to wait 8-10 days to determine if we had a secondary bacterial infection contributing (or sooner if symptoms became very severe or signs of "double sickening"). I believe today is currently day 7. If she is not noticing any improvement at all, let me know and we can do a Zpak, but if this is still all viral, it will not be beneficial.

## 2023-03-15 NOTE — Telephone Encounter (Signed)
Never returned call to see how she was doing.

## 2023-03-19 ENCOUNTER — Ambulatory Visit: Payer: Medicare Other | Attending: General Surgery | Admitting: Rehabilitation

## 2023-03-19 DIAGNOSIS — C50411 Malignant neoplasm of upper-outer quadrant of right female breast: Secondary | ICD-10-CM | POA: Insufficient documentation

## 2023-03-19 DIAGNOSIS — Z483 Aftercare following surgery for neoplasm: Secondary | ICD-10-CM | POA: Insufficient documentation

## 2023-03-19 NOTE — Therapy (Signed)
OUTPATIENT PHYSICAL THERAPY SOZO SCREENING NOTE   Patient Name: Karina Ferguson MRN: [id number] DOB:[birth year], 67 y.o., female Today's Date: 03/19/2023  PCP: Clayborne Dana, NP REFERRING PROVIDER: Emelia Loron, MD   PT End of Session - 03/19/23 1041     Visit Number 2   screen   PT Start Time 1037    PT Stop Time 1041    PT Time Calculation (min) 4 min    Activity Tolerance Patient tolerated treatment well    Behavior During Therapy [facility] for tasks assessed/performed             Past Medical History:  Diagnosis Date   Allergic rhinitis    Allergy    Anemia    Anxiety    Arthritis    Breast cancer (HCC) 06/05/2022   Cancer (HCC) 04/2022   right breast IDC/DCIS   Chronic idiopathic constipation    GERD (gastroesophageal reflux disease)    Hyperlipidemia    Hypertension    Insomnia    Sleep apnea    wears CPAP nightly   Stage 3 chronic kidney disease (HCC)    Thyroid disease    Vertigo    Past Surgical History:  Procedure Laterality Date   ABDOMINAL HYSTERECTOMY     BREAST BIOPSY  09/13/2022   MM RT RADIOACTIVE SEED LOC MAMMO GUIDE 09/13/2022 [facility]   BREAST BIOPSY  09/13/2022   MM RT RADIOACTIVE SEED EA ADD LESION LOC MAMMO GUIDE 09/13/2022 [facility]   BREAST LUMPECTOMY WITH RADIOACTIVE SEED AND SENTINEL LYMPH NODE BIOPSY Right 09/14/2022   Procedure: RIGHT BREAST LUMPECTOMY WITH RADIOACTIVE SEED;  Surgeon: Emelia Loron, MD;  Location: [facility] OR;  Service: General;  Laterality: Right;   Left breast cancer     20 years ago   PORT-A-CATH REMOVAL N/A 09/14/2022   Procedure: REMOVAL PORT-A-CATH;  Surgeon: Emelia Loron, MD;  Location: [facility] OR;  Service: General;  Laterality: N/A;   PORTACATH PLACEMENT Right 06/19/2022   Procedure: PORT PLACEMENT WITH ULTRASOUND GUIDANCE;  Surgeon: Emelia Loron, MD;  Location: [facility];  Service: General;  Laterality: Right;   RADIOACTIVE SEED GUIDED AXILLARY SENTINEL LYMPH NODE Right  09/14/2022   Procedure: Right AXILLARY SENTINEL LYMPH NODE;  Surgeon: Emelia Loron, MD;  Location: [facility] OR;  Service: General;  Laterality: Right;   RADIOACTIVE SEED GUIDED EXCISIONAL BREAST BIOPSY Right 09/14/2022   Procedure: RADIOACTIVE SEED GUIDED EXCISIONAL BREAST BIOPSY;  Surgeon: Emelia Loron, MD;  Location[facility] OR;  Service: General;  Laterality: Right;   THYROIDECTOMY, PARTIAL     Patient Active Problem List   Diagnosis Date Noted   Status post right breast lumpectomy 09/14/2022   Port-A-Cath in place 07/27/2022   Genetic testing 07/14/2022   Malignant neoplasm of upper-outer quadrant of right breast in female, estrogen receptor negative (HCC) 06/15/2022   Moderate obstructive sleep apnea 02/17/2022   Snoring 10/04/2021   Allergic rhinitis 10/04/2021   Acute bacterial rhinosinusitis 08/31/2021   Upper airway cough syndrome 08/31/2021   Irritable bowel syndrome 04/11/2021   Impaired fasting glucose 02/12/2020   Keratoconjunctivitis sicca of both eyes not specified as Sjogren's 01/19/2020   Nuclear sclerotic cataract of both eyes 01/19/2020   Posterior vitreous detachment of both eyes 01/19/2020   Class 2 obesity 11/06/2017   Constipation 10/03/2015   Esophageal reflux 10/03/2015   Essential hypertension 10/03/2015   History of breast cancer 10/03/2015   History of IBS 10/03/2015   History of orthostatic hypotension 10/03/2015   Hyperlipemia 10/03/2015  Insomnia 10/03/2015   Pain, joint, multiple sites 10/03/2015   S/P partial thyroidectomy 10/03/2015   Situational anxiety 10/03/2015   Stage 3 chronic kidney disease (HCC) 10/03/2015   Left lumbar radiculopathy 06/30/2014   Neurogenic claudication 06/30/2014   Other intervertebral disc degeneration, lumbar region 06/30/2014    REFERRING DIAG: right breast cancer at risk for lymphedema  THERAPY DIAG:  Aftercare following surgery for neoplasm  Malignant neoplasm of upper-outer quadrant of right female breast,  unspecified estrogen receptor status (HCC)  PERTINENT HISTORY: 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 It measures 1.9 cm and is located in the upper-outer quadrant. 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 She will be having neoadjuvant chemotherapy starting next week. She had a routine mammogram which was negative, but requested an MRI because of C density breasts. She had a prior left Lumpectomy for DCIS in 2004 . On 09/14/2022 she had a right Lumpectomy with SLNB with 0/1 LN. She will have radiation consult Feb. 14,2024   PRECAUTIONS: right UE Lymphedema risk, None  SUBJECTIVE: Pt returns for her 3 month L-Dex screen.   PAIN:  Are you having pain? No  SOZO SCREENING: Patient was assessed today using the SOZO machine to determine the lymphedema index score. This was compared to her baseline score. It was determined that she is within the recommended range when compared to her baseline and no further action is needed at this time. She will continue SOZO screenings. These are done every 3 months for 2 years post operatively followed by every 6 months for 2 years, and then annually.   L-DEX FLOWSHEETS - 03/19/23 1000       L-DEX LYMPHEDEMA SCREENING   Measurement Type Unilateral    L-DEX MEASUREMENT EXTREMITY Upper Extremity    POSITION  Standing    DOMINANT SIDE Right    At Risk Side Right    BASELINE SCORE (UNILATERAL) 3.6    L-DEX SCORE (UNILATERAL) 3.3    VALUE CHANGE (UNILAT) -0.3              Addylynn Balin, Julieanne Manson, PT 03/19/2023, 10:41 AM

## 2023-03-23 ENCOUNTER — Other Ambulatory Visit: Payer: Self-pay

## 2023-04-15 IMAGING — DX DG CHEST 2V
2 series · 2 of 2 positions shown · non-contrast
Comparison: 11/05/2020

CLINICAL DATA: Cough.

EXAM:
CHEST - 2 VIEW

[chest pa]
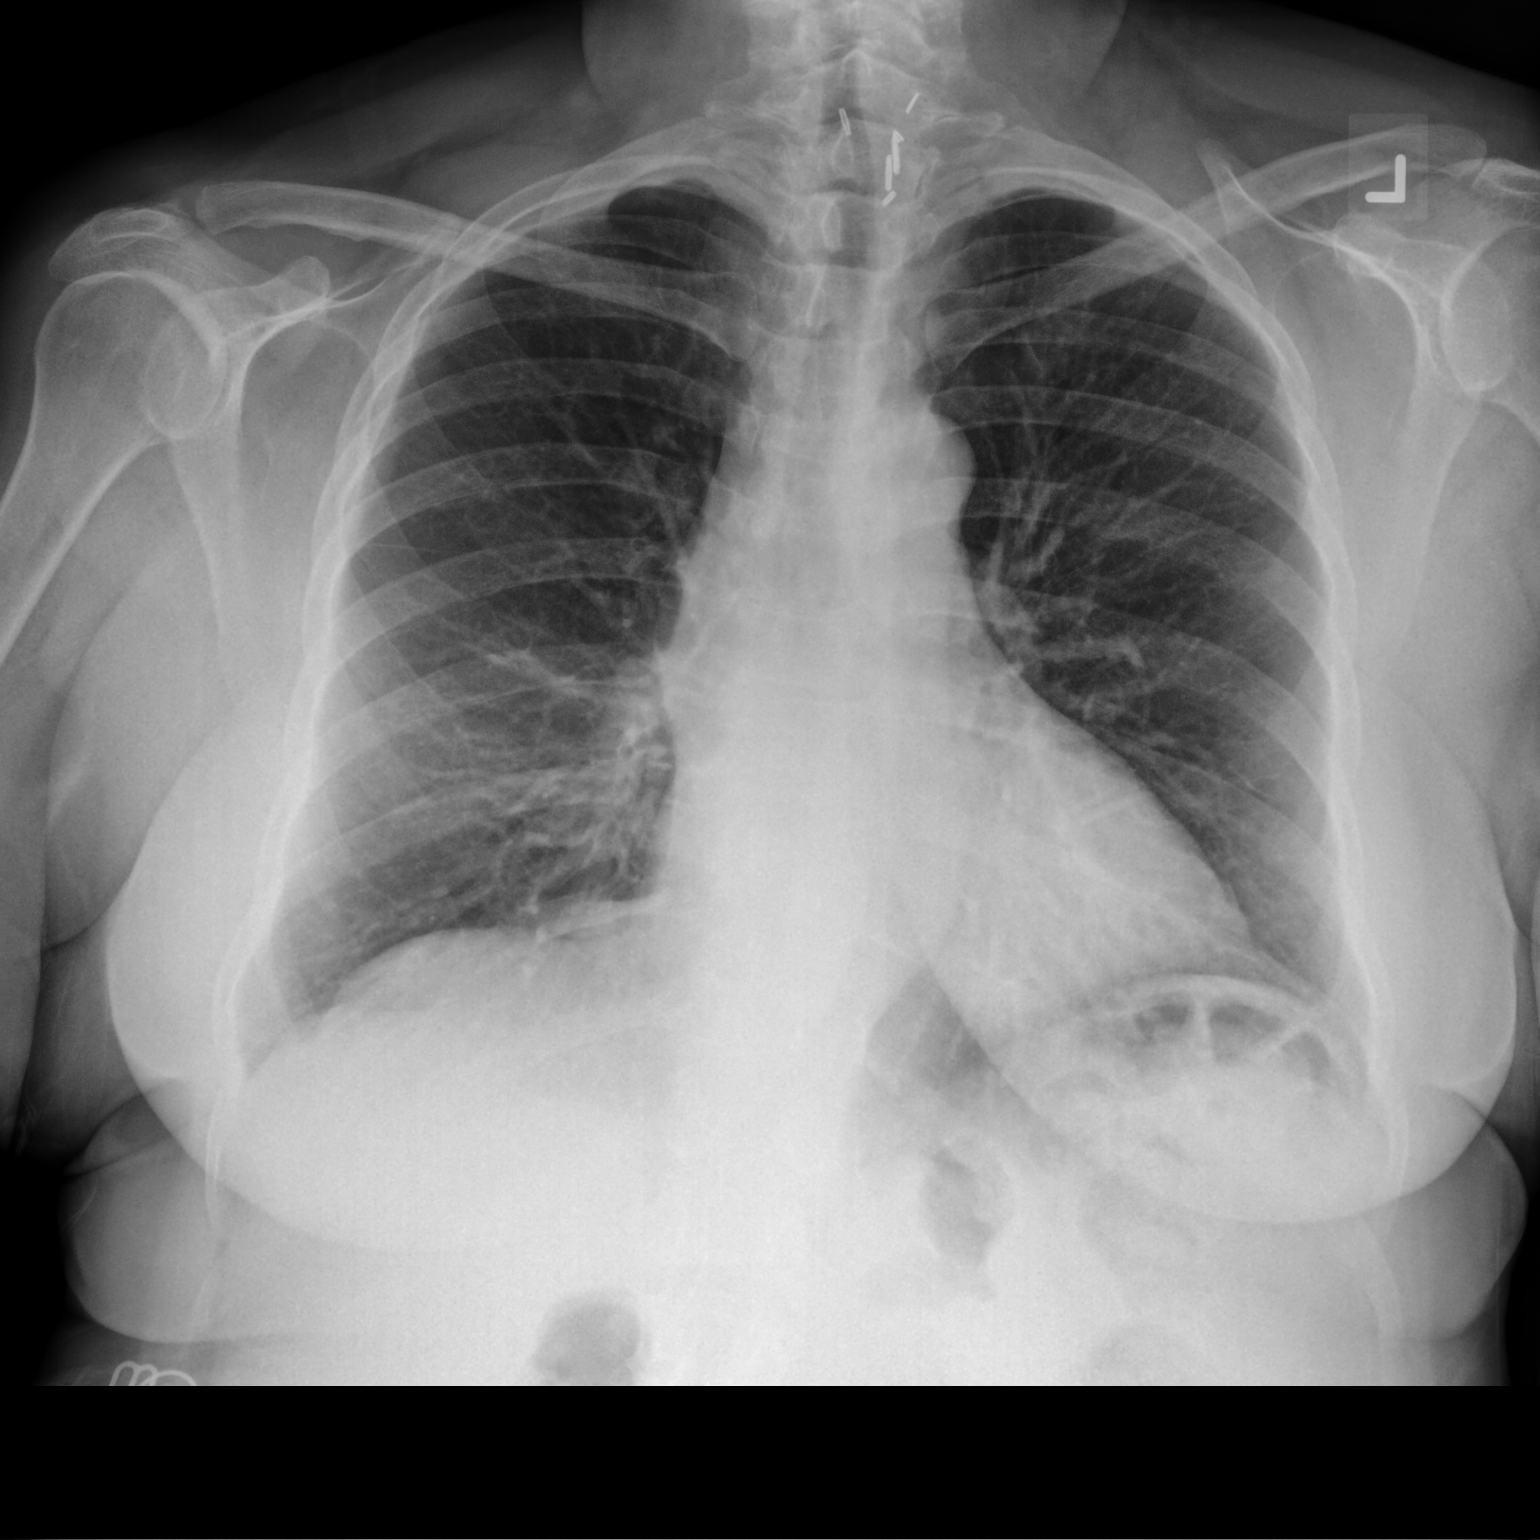

[chest lat]
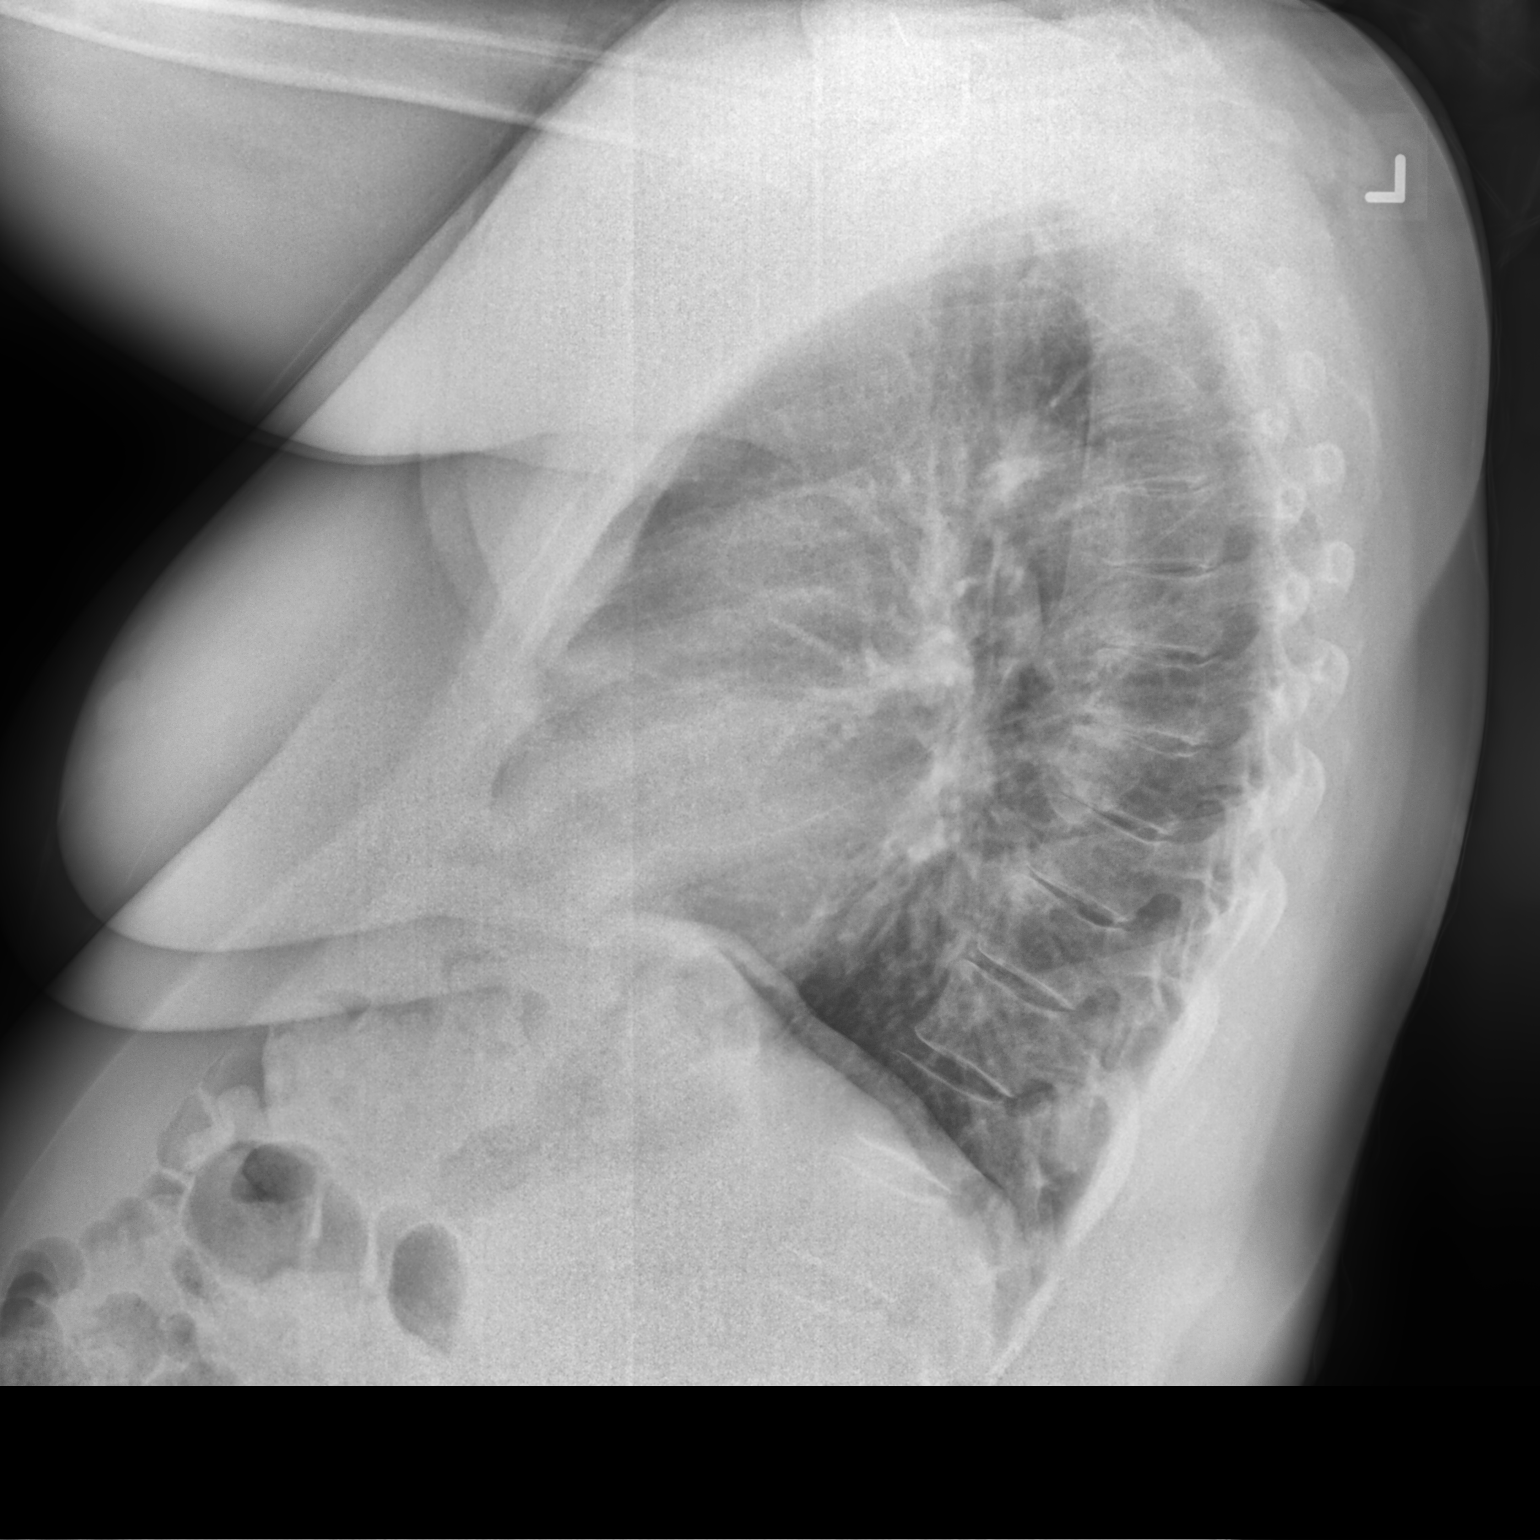

[2 of 2 positions shown; findings below may reference images not displayed]

FINDINGS: The heart size and mediastinal contours are within normal limits.
Both lungs are clear. The visualized skeletal structures are
unremarkable.
IMPRESSION: No active cardiopulmonary disease.

## 2023-05-15 ENCOUNTER — Ambulatory Visit (INDEPENDENT_AMBULATORY_CARE_PROVIDER_SITE_OTHER): Payer: Medicare Other | Admitting: Family Medicine

## 2023-05-15 ENCOUNTER — Encounter: Payer: Self-pay | Admitting: Family Medicine

## 2023-05-15 VITALS — BP 125/73 | HR 83 | Temp 98.1°F | Ht 69.0 in | Wt 236.0 lb

## 2023-05-15 DIAGNOSIS — E669 Obesity, unspecified: Secondary | ICD-10-CM

## 2023-05-15 DIAGNOSIS — G629 Polyneuropathy, unspecified: Secondary | ICD-10-CM | POA: Diagnosis not present

## 2023-05-15 DIAGNOSIS — J0140 Acute pansinusitis, unspecified: Secondary | ICD-10-CM

## 2023-05-15 LAB — COMPREHENSIVE METABOLIC PANEL
ALT: 16 U/L (ref 0–35)
AST: 18 U/L (ref 0–37)
Albumin: 4 g/dL (ref 3.5–5.2)
Alkaline Phosphatase: 54 U/L (ref 39–117)
BUN: 12 mg/dL (ref 6–23)
CO2: 28 meq/L (ref 19–32)
Calcium: 9.9 mg/dL (ref 8.4–10.5)
Chloride: 101 meq/L (ref 96–112)
Creatinine, Ser: 0.96 mg/dL (ref 0.40–1.20)
GFR: 60.46 mL/min (ref >60.00)
Glucose, Bld: 104 mg/dL — ABNORMAL HIGH (ref 70–99)
Potassium: 4 meq/L (ref 3.5–5.1)
Sodium: 137 meq/L (ref 135–145)
Total Bilirubin: 0.5 mg/dL (ref 0.2–1.2)
Total Protein: 6.6 g/dL (ref 6.0–8.3)

## 2023-05-15 LAB — CBC WITH DIFFERENTIAL/PLATELET
Basophils Absolute: 0 10*3/uL (ref 0.0–0.1)
Basophils Relative: 0.5 % (ref 0.0–3.0)
Eosinophils Absolute: 0.2 10*3/uL (ref 0.0–0.7)
Eosinophils Relative: 3.6 % (ref 0.0–5.0)
HCT: 39.1 % (ref 36.0–46.0)
Hemoglobin: 12.8 g/dL (ref 12.0–15.0)
Lymphocytes Relative: 21.2 % (ref 12.0–46.0)
Lymphs Abs: 1.3 10*3/uL (ref 0.7–4.0)
MCHC: 32.8 g/dL (ref 30.0–36.0)
MCV: 91 fl (ref 78.0–100.0)
Monocytes Absolute: 0.6 10*3/uL (ref 0.1–1.0)
Monocytes Relative: 9.3 % (ref 3.0–12.0)
Neutro Abs: 4 10*3/uL (ref 1.4–7.7)
Neutrophils Relative %: 65.4 % (ref 43.0–77.0)
Platelets: 219 10*3/uL (ref 150.0–400.0)
RBC: 4.29 Mil/uL (ref 3.87–5.11)
RDW: 14 % (ref 11.5–15.5)
WBC: 6.2 10*3/uL (ref 4.0–10.5)

## 2023-05-15 LAB — B12 AND FOLATE PANEL
Folate: >24.2 ng/mL (ref >5.9)
Vitamin B-12: 1322 pg/mL — ABNORMAL HIGH (ref 211–911)

## 2023-05-15 LAB — HEMOGLOBIN A1C: Hgb A1c MFr Bld: 5.9 % (ref 4.6–6.5)

## 2023-05-15 LAB — TSH: TSH: 1.48 u[IU]/mL (ref 0.35–5.50)

## 2023-05-15 MED ORDER — AZITHROMYCIN 250 MG PO TABS
ORAL_TABLET | ORAL | 0 refills | Status: AC
Start: 2023-05-15 — End: 2023-05-20

## 2023-05-15 MED ORDER — METHYLPREDNISOLONE ACETATE 40 MG/ML IJ SUSP
40.0000 mg | Freq: Once | INTRAMUSCULAR | Status: AC
Start: 2023-05-15 — End: 2023-05-15
  Administered 2023-05-15: 40 mg via INTRAMUSCULAR

## 2023-05-15 NOTE — Progress Notes (Signed)
Acute Office Visit  Subjective:     Patient ID: Karina Ferguson, female    DOB: 11-25-1955, 67 y.o.   MRN: [id number]  Chief Complaint  Patient presents with   Headache   Cough   Sinus Problem     Patient is in today for sinusitis.   Discussed the use of AI scribe software for clinical note transcription with the patient, who gave verbal consent to proceed.  History of Present Illness   The patient, with a history of hypertension and neuropathy, presents with upper respiratory symptoms and body aches for over a week. They report constant nasal congestion, sinus pressure, and intermittent sharp pain in the temple. They also describe a sensation of pressure in the ears, leading to difficulty hearing. Despite self-medication with Mucinex, Tylenol, Coricidin, and Nyquil, the symptoms have not improved.  In addition to the upper respiratory symptoms, the patient reports a new onset of body aches, particularly in the arms and legs. They describe the arms as feeling sore and the legs as achy.  The patient also reports an increase in neuropathic symptoms, with tingling in both feet. They have been taking gabapentin 100mg  daily, but it no longer seems to provide relief. They have tried increasing the dose to twice daily without significant side effects, but with limited improvement in symptoms.  The patient has a history of COVID-19 infection several months ago but denies recent exposure to sick individuals. They also have a history of partial thyroidectomy and chemotherapy, which they believe initially triggered their neuropathy, but she would like to make sure A1c is stable.             All review of systems negative except what is listed in the HPI      Objective:    BP 125/73   Pulse 83   Temp 98.1 F (36.7 C) (Oral)   Ht 5\' 9"  (1.753 m)   Wt 236 lb (107 kg)   SpO2 100%   BMI 34.85 kg/m    Physical Exam Vitals reviewed.  Constitutional:      Appearance: Normal  appearance.  HENT:     Head: Normocephalic and atraumatic.     Comments: Frontal and maxillary sinuses tender to palpation bilaterally     Right Ear: Tympanic membrane normal.     Left Ear: Tympanic membrane normal.     Nose: Congestion and rhinorrhea present.     Mouth/Throat:     Mouth: Mucous membranes are moist.     Pharynx: Oropharynx is clear.  Eyes:     Extraocular Movements: Extraocular movements intact.  Cardiovascular:     Rate and Rhythm: Normal rate and regular rhythm.     Pulses: Normal pulses.     Heart sounds: Normal heart sounds.  Pulmonary:     Effort: Pulmonary effort is normal.     Breath sounds: Normal breath sounds. No wheezing, rhonchi or rales.  Musculoskeletal:     Cervical back: Normal range of motion and neck supple.  Lymphadenopathy:     Cervical: No cervical adenopathy.  Skin:    General: Skin is warm and dry.  Neurological:     Mental Status: She is alert and oriented to person, place, and time.  Psychiatric:        Mood and Affect: Mood normal.        Behavior: Behavior normal.        Thought Content: Thought content normal.        Judgment: Judgment normal.  No results found for any visits on 05/15/23.      Assessment & Plan:   Problem List Items Addressed This Visit   None Visit Diagnoses     Acute non-recurrent pansinusitis    -  Primary   Relevant Medications   azithromycin (ZITHROMAX) 250 MG tablet   methylPREDNISolone acetate (DEPO-MEDROL) injection 40 mg (Completed)   Neuropathy       Relevant Orders   HgB A1c   B12 and Folate Panel   Obesity (BMI 30-39.9)       Relevant Orders   HgB A1c   TSH   B12 and Folate Panel   CBC with Differential/Platelet   Comprehensive metabolic panel           Sinusitis Persistent upper respiratory symptoms for over a week with sinus pressure, nasal congestion, and facial pain. No relief with over-the-counter medications. -Administer low-dose cortisone shot today for immediate  relief. -Prescribe Z-Pak (Azithromycin) to be sent to [facility] on [address].  Peripheral Neuropathy Reports of tingling in both feet, possibly related to previous chemotherapy. Currently on low-dose Gabapentin (100mg ) with inadequate relief. -Increase Gabapentin to 300mg  daily. -Order labs to check A1C, thyroid function, and B12 levels.      Meds ordered this encounter  Medications   azithromycin (ZITHROMAX) 250 MG tablet    Sig: Take 2 tablets on day 1, then 1 tablet daily on days 2 through 5    Dispense:  6 tablet    Refill:  0    Order Specific Question:   Supervising Provider    Answer:   Danise Edge A [[id number]]   methylPREDNISolone acetate (DEPO-MEDROL) injection 40 mg    Return if symptoms worsen or fail to improve.  Clayborne Dana, NP

## 2023-05-17 ENCOUNTER — Telehealth: Payer: Self-pay

## 2023-05-17 NOTE — Telephone Encounter (Signed)
The medical records department for this patient insurance [employer] was calling looking for medical records for patient when she saw Celso Amy PA. Informed them that patient has never been seen at office since Celso Amy PA  has been our office. She use to be at another location but has recently moved to our location. They said they would let [employer] know this

## 2023-06-18 ENCOUNTER — Ambulatory Visit
Admission: RE | Admit: 2023-06-18 | Discharge: 2023-06-18 | Disposition: A | Discharge status: Home / Self Care | Payer: Medicare Other | Source: Ambulatory Visit | Attending: Adult Health | Admitting: Adult Health

## 2023-06-18 DIAGNOSIS — C50411 Malignant neoplasm of upper-outer quadrant of right female breast: Secondary | ICD-10-CM

## 2023-06-18 HISTORY — DX: Personal history of antineoplastic chemotherapy: Z92.21

## 2023-06-18 HISTORY — DX: Personal history of irradiation: Z92.3

## 2023-06-25 ENCOUNTER — Inpatient Hospital Stay: Payer: Medicare Other | Attending: Hematology and Oncology | Admitting: Hematology and Oncology

## 2023-06-25 ENCOUNTER — Ambulatory Visit: Payer: Medicare Other | Attending: General Surgery

## 2023-06-25 ENCOUNTER — Encounter: Payer: Self-pay | Admitting: *Deleted

## 2023-06-25 VITALS — BP 140/79 | HR 79 | Temp 97.2°F | Resp 18 | Ht 69.0 in | Wt 236.2 lb

## 2023-06-25 VITALS — Wt 235.4 lb

## 2023-06-25 DIAGNOSIS — Z08 Encounter for follow-up examination after completed treatment for malignant neoplasm: Secondary | ICD-10-CM | POA: Diagnosis present

## 2023-06-25 DIAGNOSIS — Z853 Personal history of malignant neoplasm of breast: Secondary | ICD-10-CM | POA: Insufficient documentation

## 2023-06-25 DIAGNOSIS — C50411 Malignant neoplasm of upper-outer quadrant of right female breast: Secondary | ICD-10-CM

## 2023-06-25 DIAGNOSIS — Z171 Estrogen receptor negative status [ER-]: Secondary | ICD-10-CM | POA: Diagnosis not present

## 2023-06-25 DIAGNOSIS — Z483 Aftercare following surgery for neoplasm: Secondary | ICD-10-CM | POA: Insufficient documentation

## 2023-06-25 NOTE — Progress Notes (Signed)
Patient Care Team: Clayborne Dana, NP as PCP - General (Family Medicine) Dorothy Puffer, MD as Consulting Physician (Radiation Oncology) Serena Croissant, MD as Consulting Physician (Hematology and Oncology) Emelia Loron, MD as Consulting Physician (General Surgery)  DIAGNOSIS:  Encounter Diagnosis  Name Primary?   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 Report date is 06/28/2022.   The Common Hereditary Cancers + RNA Panel offered by [facility] includes sequencing, deletion/duplication, and RNA testing of the following 47 genes: APC, ATM, AXIN2, BARD1, BMPR1A, BRCA1, BRCA2, BRIP1, CDH1, CDK4*, CDKN2A (p14ARF)*, CDKN2A (p16INK4a)*, CHEK2, CTNNA1, DICER1, EPCAM (Deletion/duplication testing only), GREM1 (promoter region deletion/duplication testing only), KIT, MEN1, MLH1, MSH2, MSH3, MSH6, MUTYH, NBN, NF1, NHTL1, PALB2, PDGFRA*, PMS2, POLD1, POLE, PTEN,  RAD50, RAD51C, RAD51D, SDHB, SDHC, SDHD, SMAD4, SMARCA4. STK11, TP53, TSC1, TSC2, and VHL.  The following genes were evaluated for sequence changes only: SDHA and HOXB13 c.251G>A variant only.  RNA analysis is not performed for the * genes.     09/15/2022 Surgery   Right lumpectomy: Grade 3 IDC 2.5 mm DCIS grade 3, margins negative, 0/1 lymph nodes negative   10/26/2022 - 11/22/2022 Radiation Therapy   Right Breast: 42.56 Gy in 16 treatments "Boost": 8 Gy in 4 treatments      CHIEF COMPLIANT: Surveillance of breast cancer  Discussed the use of AI scribe software for clinical note transcription with the patient, who gave verbal consent to proceed.  History of Present Illness   The patient, with a history of  breast cancer, presents for follow-up  . She reports residual discomfort in the breast after radiation therapy, which has since resolved. She also reports a sensation of heaviness in the breast, which has also resolved. The patient denies any ongoing issues with energy levels, nausea, or mouth sores from chemotherapy. She expresses concern about lymphedema in the arm after a recent sleeve procedure and is worried about potential swelling. She admits to not consistently performing recommended exercises for lymphedema management. The patient is also considering a future trip and expresses concern about potential arm swelling during air travel.        ALLERGIES:  is allergic to amoxicillin, esomeprazole magnesium, and rabeprazole.  MEDICATIONS:  Current Outpatient Medications  Medication Sig Dispense Refill   albuterol (VENTOLIN HFA) 108 (90 Base) MCG/ACT inhaler Inhale 1-2 puffs into the lungs every 6 (six) hours as needed. 18 g 3   amLODipine (NORVASC) 10 MG tablet Take 10 mg by mouth  daily.     cholecalciferol (VITAMIN D3) 25 MCG (1000 UNIT) tablet Take 1,000 Units by mouth daily.     gabapentin (NEURONTIN) 100 MG capsule Take 100 mg by mouth daily as needed (pain). PRN for back pain      hydrochlorothiazide (HYDRODIURIL) 25 MG tablet Take 25 mg by mouth daily.     HYDROcodone bit-homatropine (HYCODAN) 5-1.5 MG/5ML syrup Take 5 mLs by mouth every 6 (six) hours as needed for cough. 50 mL 0   metoprolol succinate (TOPROL-XL) 50 MG 24 hr tablet TAKE 1 TABLET DAILY 90 tablet 3   montelukast (SINGULAIR) 10 MG tablet Take 1 tablet (10 mg total) by mouth daily. 90 tablet 1   Multiple Vitamin (MULTI-VITAMIN) tablet Take 1 tablet by mouth daily.     Omega-3 Fatty Acids (FISH OIL) 1000 MG CAPS Take 1,000 mg by mouth daily.     omeprazole (PRILOSEC) 20 MG capsule Take 1 capsule (20 mg total) by mouth daily. 90 capsule 1   ondansetron (ZOFRAN-ODT) 4 MG disintegrating tablet 4mg  ODT q4 hours prn nausea/vomit 20 tablet 0   rosuvastatin (CRESTOR) 5 MG tablet Take 1 tablet (5 mg total) by mouth daily. 90 tablet 1   vitamin B-12 (CYANOCOBALAMIN) 500 MCG tablet Take 500 mcg by mouth daily.     No current facility-administered medications for this visit.    PHYSICAL EXAMINATION: ECOG PERFORMANCE STATUS: 1 - Symptomatic but completely ambulatory  Vitals:   06/25/23 1106  BP: (!) 140/79  Pulse: 79  Resp: 18  Temp: (!) 97.2 F (36.2 C)  SpO2: 100%   Filed Weights   06/25/23 1106  Weight: 236 lb 3.2 oz (107.1 kg)      LABORATORY DATA:  I have reviewed the data as listed    Latest Ref Rng & Units 05/15/2023   11:10 AM 10/30/2022    9:12 AM 09/08/2022    1:40 PM  CMP  Glucose 70 - 99 mg/dL 161  97  98   BUN 6 - 23 mg/dL 12  11  10    Creatinine 0.40 - 1.20 mg/dL 0.96  0.45  4.09   Sodium 135 - 145 mEq/L 137  138  136   Potassium 3.5 - 5.1 mEq/L 4.0  4.0  3.8   Chloride 96 - 112 mEq/L 101  101  102   CO2 19 - 32 mEq/L 28  28  25    Calcium 8.4 - 10.5 mg/dL 9.9  81.1  91.4   Total Protein 6.0 - 8.3 g/dL 6.6  6.8    Total Bilirubin 0.2 - 1.2 mg/dL 0.5  0.5    Alkaline Phos 39 - 117 U/L 54  64    AST 0 - 37 U/L 18  21    ALT 0 - 35 U/L 16  19      Lab Results  Component Value  Date   WBC 6.2 05/15/2023   HGB 12.8 05/15/2023   HCT 39.1 05/15/2023   MCV 91.0 05/15/2023   PLT 219.0 05/15/2023   NEUTROABS 4.0 05/15/2023    ASSESSMENT & PLAN:  Malignant neoplasm of upper-outer quadrant of right breast in female, estrogen receptor negative (HCC) Breast MRI performed because of high breast density 05/10/2022: Right breast linear enhancement 1.9 cm at 12 o'clock position, indeterminate mass 8 mm in the lateral periareolar region right breast. 06/05/2022: Right breast biopsy 12 o'clock position: Grade 3 IDC with high-grade DCIS ER 0%, PR 0%, Ki-67 60%, HER2 1+ negative, second biopsy was fibrocystic  change   (2004 left breast DCIS ER/PR negative).   Treatment plan: 1.  06/29/2022 neoadjuvant with dose dense Adriamycin and Cytoxan x4 (Taxol not given because of neuropathy) 2. 09/15/2022: Right lumpectomy: Grade 3 IDC 2.5 mm DCIS grade 3, margins negative, 0/1 lymph nodes negative 3.  11/22/2022: Completed adjuvant radiation ------------------------------------------------------------------------------------------------------------------------------------------------------- Current treatment: Surveillance   Breast cancer surveillance:   Breast exam 06/17/2023: Benign Mammogram 06/18/2023: Benign breast density category B  Guardant for minimal residual disease monitoring in 6 months and then once a year   General Health Maintenance Discussed the importance of regular exercise, a plant-based diet, and limiting alcohol consumption. -Encouraged to continue these lifestyle modifications for overall health and wellness.  Follow-up Plan to see the patient annually for check-ups and follow-ups post-mammogram.      Return to clinic in 1 year for follow-up  No orders of the defined types were placed in this encounter.  The patient has a good understanding of the overall plan. she agrees with it. she will call with any problems that may develop before the next visit  here. Total time spent: 30 mins including face to face time and time spent for planning, charting and co-ordination of care   Tamsen Meek, MD 06/25/23

## 2023-06-25 NOTE — Assessment & Plan Note (Addendum)
Breast MRI performed because of high breast density 05/10/2022: Right breast linear enhancement 1.9 cm at 12 o'clock position, indeterminate mass 8 mm in the lateral periareolar region right breast. 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   Treatment plan: 1.  06/29/2022 neoadjuvant with dose dense Adriamycin and Cytoxan x4 (Taxol not given because of neuropathy) 2. 09/15/2022: Right lumpectomy: Grade 3 IDC 2.5 mm DCIS grade 3, margins negative, 0/1 lymph nodes negative 3.  11/22/2022: Completed adjuvant radiation ------------------------------------------------------------------------------------------------------------------------------------------------------- Current treatment: Surveillance   Breast cancer surveillance:   Breast exam 06/17/2023: Benign Mammogram 06/18/2023: Benign breast density category B  Guardant for minimal residual disease monitoring in 6 months and then once a year  Return to clinic in 1 year for follow-up

## 2023-06-25 NOTE — Therapy (Signed)
OUTPATIENT PHYSICAL THERAPY SOZO SCREENING NOTE   Patient Name: Karina Ferguson MRN: [id number] DOB:Oct 25, 1955, 68 y.o., female Today's Date: 06/25/2023  PCP: Clayborne Dana, NP REFERRING PROVIDER: Emelia Loron, MD   PT End of Session - 06/25/23 1019     Visit Number 2   # unchanged due to screen only   PT Start Time 1017    PT Stop Time 1040    PT Time Calculation (min) 23 min    Activity Tolerance Patient tolerated treatment well    Behavior During Therapy [facility] for tasks assessed/performed             Past Medical History:  Diagnosis Date   Allergic rhinitis    Allergy    Anemia    Anxiety    Arthritis    Breast cancer (HCC) 06/05/2022   Cancer (HCC) 04/2022   right breast IDC/DCIS   Chronic idiopathic constipation    GERD (gastroesophageal reflux disease)    Hyperlipidemia    Hypertension    Insomnia    Personal history of chemotherapy    Personal history of radiation therapy    Sleep apnea    wears CPAP nightly   Stage 3 chronic kidney disease (HCC)    Thyroid disease    Vertigo    Past Surgical History:  Procedure Laterality Date   ABDOMINAL HYSTERECTOMY     BREAST BIOPSY  09/13/2022   MM RT RADIOACTIVE SEED LOC MAMMO GUIDE 09/13/2022 [facility]   BREAST BIOPSY  09/13/2022   MM RT RADIOACTIVE SEED EA ADD LESION LOC MAMMO GUIDE 09/13/2022 [facility]   BREAST LUMPECTOMY Right 09/14/2022   BREAST LUMPECTOMY Left 2004   BREAST LUMPECTOMY WITH RADIOACTIVE SEED AND SENTINEL LYMPH NODE BIOPSY Right 09/14/2022   Procedure: RIGHT BREAST LUMPECTOMY WITH RADIOACTIVE SEED;  Surgeon: Emelia Loron, MD;  Location: [facility] OR;  Service: General;  Laterality: Right;   Left breast cancer     20 years ago   PORT-A-CATH REMOVAL N/A 09/14/2022   Procedure: REMOVAL PORT-A-CATH;  Surgeon: Emelia Loron, MD;  Location: [facility] OR;  Service: General;  Laterality: N/A;   PORTACATH PLACEMENT Right 06/19/2022   Procedure: PORT PLACEMENT WITH ULTRASOUND  GUIDANCE;  Surgeon: Emelia Loron, MD;  Location: [facility];  Service: General;  Laterality: Right;   RADIOACTIVE SEED GUIDED AXILLARY SENTINEL LYMPH NODE Right 09/14/2022   Procedure: Right AXILLARY SENTINEL LYMPH NODE;  Surgeon: Emelia Loron, MD;  Location[facility] OR;  Service: General;  Laterality: Right;   RADIOACTIVE SEED GUIDED EXCISIONAL BREAST BIOPSY Right 09/14/2022   Procedure: RADIOACTIVE SEED GUIDED EXCISIONAL BREAST BIOPSY;  Surgeon: Emelia Loron, MD;  Location: [facility] OR;  Service: General;  Laterality: Right;   THYROIDECTOMY, PARTIAL     Patient Active Problem List   Diagnosis Date Noted   Status post right breast lumpectomy 09/14/2022   Port-A-Cath in place 07/27/2022   Genetic testing 07/14/2022   Malignant neoplasm of upper-outer quadrant of right breast in female, estrogen receptor negative (HCC) 06/15/2022   Moderate obstructive sleep apnea 02/17/2022   Snoring 10/04/2021   Allergic rhinitis 10/04/2021   Acute bacterial rhinosinusitis 08/31/2021   Upper airway cough syndrome 08/31/2021   Irritable bowel syndrome 04/11/2021   Impaired fasting glucose 02/12/2020   Keratoconjunctivitis sicca of both eyes not specified as Sjogren's 01/19/2020   Nuclear sclerotic cataract of both eyes 01/19/2020   Posterior vitreous detachment of both eyes 01/19/2020   Class 2 obesity 11/06/2017   Constipation 10/03/2015   Esophageal reflux  10/03/2015   Essential hypertension 10/03/2015   History of breast cancer 10/03/2015   History of IBS 10/03/2015   History of orthostatic hypotension 10/03/2015   Hyperlipemia 10/03/2015   Insomnia 10/03/2015   Pain, joint, multiple sites 10/03/2015   S/P partial thyroidectomy 10/03/2015   Situational anxiety 10/03/2015   Stage 3 chronic kidney disease (HCC) 10/03/2015   Left lumbar radiculopathy 06/30/2014   Neurogenic claudication 06/30/2014   Other intervertebral disc degeneration, lumbar region 06/30/2014     REFERRING DIAG: right breast cancer at risk for lymphedema  THERAPY DIAG: Aftercare following surgery for neoplasm  PERTINENT HISTORY: 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 It measures 1.9 cm and is located in the upper-outer quadrant. 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 She will be having neoadjuvant chemotherapy starting next week. She had a routine mammogram which was negative, but requested an MRI because of C density breasts. She had a prior left Lumpectomy for DCIS in 2004 . On 09/14/2022 she had a right Lumpectomy with SLNB with 0/1 LN. She will have radiation consult Feb. 14,2024   PRECAUTIONS: right UE Lymphedema risk, None  SUBJECTIVE: Pt returns for her 3 month L-Dex screen.   PAIN:  Are you having pain? No  SOZO SCREENING: Patient was assessed today using the SOZO machine to determine the lymphedema index score. This was compared to her baseline score. It was determined that she is NOT within the recommended range when compared to her baseline and so she was fitted for a compression garment while in the clinic today (size V, EW, black). It is recommended she return in 1 month to be reassessed. If she continues to measure outside the recommended range, physical therapy treatment will be recommended at that time and a referral requested.   L-DEX FLOWSHEETS - 06/25/23 1000       L-DEX LYMPHEDEMA SCREENING   Measurement Type Unilateral    L-DEX MEASUREMENT EXTREMITY Upper Extremity    POSITION  Standing    DOMINANT SIDE Right    At Risk Side Right    BASELINE SCORE (UNILATERAL) 3.6    L-DEX SCORE (UNILATERAL) 10.6    VALUE CHANGE (UNILAT) 7            P: Pt to return in 1 month for reassess.    Hermenia Bers, PTA 06/25/2023, 11:01 AM   Pt was educated in the following:    PLEASE KEEP YOUR COMPRESSION GARMENT ON DURING THE DAY TO GET THE BEST SWELLING REDUCTION. HERE ARE SOME ADDITIONAL TIPS: Do not sleep in your  garment. If you have pain or notice swelling in your hand or at the top of your shoulder, call your therapist. This may be a sign that you need a different garment. 3.  Take good care of your garment so it lasts longer: Follow washing instructions on your garment label or box. Wash periodically using a mild detergent in warm water.  Do not use fabric softener or bleach.  Place garment in a mesh lingerie bag and use the gentle cycle of the washing machine or hand wash. Tumble dry low or lay flat to dry. TAKE CARE OF YOUR SKIN Apply a low pH moisturizing lotion to your skin daily Avoid scratching your skin Treat skin irritations quickly  Know the 5 warning signs of infection: redness, pain, warmth to touch, fever and increased swelling.  Call your physician immediately if you notice any of these signs of a possible infection.

## 2023-06-25 NOTE — Progress Notes (Signed)
Per MD request RN successfully faxed [employer] orders with expected draw date to be April 2025.

## 2023-06-26 ENCOUNTER — Other Ambulatory Visit: Payer: Self-pay

## 2023-06-29 ENCOUNTER — Ambulatory Visit (INDEPENDENT_AMBULATORY_CARE_PROVIDER_SITE_OTHER): Payer: Medicare Other | Admitting: Physician Assistant

## 2023-06-29 ENCOUNTER — Ambulatory Visit (HOSPITAL_BASED_OUTPATIENT_CLINIC_OR_DEPARTMENT_OTHER)
Admission: RE | Admit: 2023-06-29 | Discharge: 2023-06-29 | Disposition: A | Discharge status: Home / Self Care | Payer: Medicare Other | Source: Ambulatory Visit | Attending: Physician Assistant | Admitting: Physician Assistant

## 2023-06-29 ENCOUNTER — Encounter: Payer: Self-pay | Admitting: Physician Assistant

## 2023-06-29 VITALS — BP 128/87 | HR 96 | Temp 98.5°F | Ht 69.0 in | Wt 235.0 lb

## 2023-06-29 DIAGNOSIS — G6289 Other specified polyneuropathies: Secondary | ICD-10-CM

## 2023-06-29 DIAGNOSIS — R29898 Other symptoms and signs involving the musculoskeletal system: Secondary | ICD-10-CM | POA: Diagnosis present

## 2023-06-29 NOTE — Progress Notes (Signed)
Established patient visit   Patient: Karina Ferguson   DOB: 27-Aug-1956   67 y.o. Female  MRN: [id number] Visit Date: 06/29/2023  Today's healthcare provider: Alfredia Ferguson, PA-C   Chief Complaint  Patient presents with   Leg Pain    States pain is mainly left leg sometimes right- knee feels weak. Happens randomly- little swollen. No injury or fall. Nagging, throbbing, stabbing, ache.   Subjective     Patient reports for the last few weeks persistent bilateral leg weakness and pain, more prevalent on the left than the right.  She already has some neuropathy in her left foot, she thinks this is worsening.  The pain is intermittent and she appreciates it as a stabbing or shooting pain, occurs anywhere in her lower extremity.  The weakness is more persistent she notices it the most when she goes from sitting to standing.  She does recall some "bulging disks" in her low back and has intermittent low back pain but no recent injury or worsening of this pain.  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  Medications: Outpatient Medications Prior to Visit  Medication Sig   albuterol (VENTOLIN HFA) 108 (90 Base) MCG/ACT inhaler Inhale 1-2 puffs into the lungs every 6 (six) hours as needed.   amLODipine (NORVASC) 10 MG tablet Take 10 mg by mouth daily.   cholecalciferol (VITAMIN D3) 25 MCG (1000 UNIT) tablet Take 1,000 Units by mouth daily.   gabapentin (NEURONTIN) 100 MG capsule Take 100 mg by mouth daily as needed (pain). PRN for back pain   hydrochlorothiazide (HYDRODIURIL) 25 MG tablet Take 25 mg by mouth daily.   HYDROcodone bit-homatropine (HYCODAN) 5-1.5 MG/5ML syrup Take 5 mLs by mouth every 6 (six) hours as needed for cough.   metoprolol succinate (TOPROL-XL) 50 MG 24 hr tablet TAKE 1 TABLET DAILY   montelukast (SINGULAIR) 10 MG tablet Take 1 tablet (10 mg total) by mouth daily.   Multiple Vitamin (MULTI-VITAMIN)  tablet Take 1 tablet by mouth daily.   Omega-3 Fatty Acids (FISH OIL) 1000 MG CAPS Take 1,000 mg by mouth daily.   omeprazole (PRILOSEC) 20 MG capsule Take 1 capsule (20 mg total) by mouth daily.   ondansetron (ZOFRAN-ODT) 4 MG disintegrating tablet 4mg  ODT q4 hours prn nausea/vomit   rosuvastatin (CRESTOR) 5 MG tablet Take 1 tablet (5 mg total) by mouth daily.   vitamin B-12 (CYANOCOBALAMIN) 500 MCG tablet Take 500 mcg by mouth daily.   No facility-administered medications prior to visit.    Review of Systems  Constitutional:  Negative for fatigue and fever.  Respiratory:  Negative for cough and shortness of breath.   Cardiovascular:  Negative for chest pain and leg swelling.  Gastrointestinal:  Negative for abdominal pain.  Musculoskeletal:  Positive for arthralgias.  Neurological:  Positive for weakness. Negative for dizziness and headaches.       Objective    BP 128/87   Pulse 96   Temp 98.5 F (36.9 C) (Oral)   Ht 5\' 9"  (1.753 m)   Wt 235 lb (106.6 kg)   SpO2 100%   BMI 34.70 kg/m    Physical Exam Vitals reviewed.  Constitutional:      Appearance: She is not ill-appearing.  HENT:     Head: Normocephalic.  Eyes:     Conjunctiva/sclera: Conjunctivae normal.  Cardiovascular:     Rate and Rhythm: Normal rate.  Pulmonary:     Effort: Pulmonary  effort is normal. No respiratory distress.  Musculoskeletal:     Comments: Bilateral lower extremities are nontender.  No edema is present Left lower extremity has appropriate pulses  Skin:    Comments: Appropriately warm and not dry no skin changes--no hyperpigmentation or ulcerations  Neurological:     General: No focal deficit present.     Mental Status: She is alert and oriented to person, place, and time.     Comments: Left lower extremity strength overall is about a 4 out of 5.  Calf strength is more diminished than ankle strength Right lower extremity strength is 5 out of 5  No notable tremors at rest   Psychiatric:        Mood and Affect: Mood normal.        Behavior: Behavior normal.      No results found for any visits on 06/29/23.  Assessment & Plan    Bilateral leg weakness -     Ambulatory referral to Neurology -     US Venous Img Lower Bilateral (DVT)  Other polyneuropathy -     Ambulatory referral to Neurology   Patient has concern over bilateral lower extremity blood clots.  Reassured she has no significant swelling redness or pain currently.  She is at higher risk as she has been more immobile recently than in the past and underwent chemotherapy for breast cancer Will check ultrasound bilateral to rule out DVTs.  Lower suspicion for vascular disease as there are no skin changes, no edema. Last LDL was 138.  She started Crestor 5 mg 10/2022 when these labs were checked.  Suspicious for polyneuropathy and weakness postchemotherapy referring to neurology  Return if symptoms worsen or fail to improve.       Alfredia Ferguson, PA-C  [facility] at [facility] [phone] (phone) [phone] (fax)  [facility]

## 2023-07-09 ENCOUNTER — Telehealth: Payer: Self-pay

## 2023-07-09 ENCOUNTER — Other Ambulatory Visit: Payer: Self-pay

## 2023-07-09 DIAGNOSIS — C50411 Malignant neoplasm of upper-outer quadrant of right female breast: Secondary | ICD-10-CM

## 2023-07-09 DIAGNOSIS — I10 Essential (primary) hypertension: Secondary | ICD-10-CM

## 2023-07-09 NOTE — Telephone Encounter (Signed)
Returned her call regarding referral to cardiology. Told her Roslynn Amble, DNP sent a referral to cardiology to Dr. Carolan Clines. Ask her to call the office back for questions.

## 2023-07-09 NOTE — Telephone Encounter (Signed)
Returned her call and left a message asking her to call the office back. She left a message earlier regarding what cardiologist was mentioned at 4/25 visit with Lillard Anes, DNP.

## 2023-07-12 ENCOUNTER — Encounter: Payer: Self-pay | Admitting: Hematology and Oncology

## 2023-07-13 ENCOUNTER — Telehealth: Payer: Self-pay | Admitting: Family Medicine

## 2023-07-13 NOTE — Telephone Encounter (Signed)
Called and let patient know

## 2023-07-13 NOTE — Telephone Encounter (Signed)
Pt called and stated that she would like her Neurology referral sent to a different neurology office. She saw Lillia Abed in Early November. She explained that she was not impressed with the reviews. Additionally, she mentioned she also doesn't want to go to [facility]. Patient stated that if she can have it sent to [facility] it would work for her. Please call and advise with patient to discuss options.

## 2023-07-13 NOTE — Telephone Encounter (Signed)
Patient called back and stated that she wants it sent to [facility] on [address] Please advise.

## 2023-07-23 ENCOUNTER — Ambulatory Visit: Payer: Medicare Other | Attending: General Surgery

## 2023-07-23 VITALS — Wt 236.4 lb

## 2023-07-23 DIAGNOSIS — Z483 Aftercare following surgery for neoplasm: Secondary | ICD-10-CM | POA: Insufficient documentation

## 2023-07-23 NOTE — Therapy (Addendum)
OUTPATIENT PHYSICAL THERAPY SOZO SCREENING NOTE   Patient Name: Karina Ferguson MRN: [id number] DOB:Dec 30, 1955, 67 y.o., female Today's Date: 07/23/2023  PCP: Clayborne Dana, NP REFERRING PROVIDER: Emelia Loron, MD   PT End of Session - 07/23/23 1603     Visit Number 2   # unchanged due to screen only   PT Start Time 1602    PT Stop Time 1606    PT Time Calculation (min) 4 min    Activity Tolerance Patient tolerated treatment well    Behavior During Therapy [facility] for tasks assessed/performed             Past Medical History:  Diagnosis Date   Allergic rhinitis    Allergy    Anemia    Anxiety    Arthritis    Breast cancer (HCC) 06/05/2022   Cancer (HCC) 04/2022   right breast IDC/DCIS   Chronic idiopathic constipation    GERD (gastroesophageal reflux disease)    Hyperlipidemia    Hypertension    Insomnia    Personal history of chemotherapy    Personal history of radiation therapy    Sleep apnea    wears CPAP nightly   Stage 3 chronic kidney disease (HCC)    Thyroid disease    Vertigo    Past Surgical History:  Procedure Laterality Date   ABDOMINAL HYSTERECTOMY     BREAST BIOPSY  09/13/2022   MM RT RADIOACTIVE SEED LOC MAMMO GUIDE 09/13/2022 [facility]   BREAST BIOPSY  09/13/2022   MM RT RADIOACTIVE SEED EA ADD LESION LOC MAMMO GUIDE 09/13/2022 [facility]   BREAST LUMPECTOMY Right 09/14/2022   BREAST LUMPECTOMY Left 2004   BREAST LUMPECTOMY WITH RADIOACTIVE SEED AND SENTINEL LYMPH NODE BIOPSY Right 09/14/2022   Procedure: RIGHT BREAST LUMPECTOMY WITH RADIOACTIVE SEED;  Surgeon: Emelia Loron, MD;  Location: [facility] OR;  Service: General;  Laterality: Right;   Left breast cancer     20 years ago   PORT-A-CATH REMOVAL N/A 09/14/2022   Procedure: REMOVAL PORT-A-CATH;  Surgeon: Emelia Loron, MD;  Location: [facility] OR;  Service: General;  Laterality: N/A;   PORTACATH PLACEMENT Right 06/19/2022   Procedure: PORT PLACEMENT WITH ULTRASOUND  GUIDANCE;  Surgeon: Emelia Loron, MD;  Location:  SURGERY CENTER;  Service: General;  Laterality: Right;   RADIOACTIVE SEED GUIDED AXILLARY SENTINEL LYMPH NODE Right 09/14/2022   Procedure: Right AXILLARY SENTINEL LYMPH NODE;  Surgeon: Emelia Loron, MD;  Location: [facility] OR;  Service: General;  Laterality: Right;   RADIOACTIVE SEED GUIDED EXCISIONAL BREAST BIOPSY Right 09/14/2022   Procedure: RADIOACTIVE SEED GUIDED EXCISIONAL BREAST BIOPSY;  Surgeon: Emelia Loron, MD;  Location: [facility] OR;  Service: General;  Laterality: Right;   THYROIDECTOMY, PARTIAL     Patient Active Problem List   Diagnosis Date Noted   Status post right breast lumpectomy 09/14/2022   Port-A-Cath in place 07/27/2022   Genetic testing 07/14/2022   Malignant neoplasm of upper-outer quadrant of right breast in female, estrogen receptor negative (HCC) 06/15/2022   Moderate obstructive sleep apnea 02/17/2022   Snoring 10/04/2021   Allergic rhinitis 10/04/2021   Acute bacterial rhinosinusitis 08/31/2021   Upper airway cough syndrome 08/31/2021   Irritable bowel syndrome 04/11/2021   Impaired fasting glucose 02/12/2020   Keratoconjunctivitis sicca of both eyes not specified as Sjogren's 01/19/2020   Nuclear sclerotic cataract of both eyes 01/19/2020   Posterior vitreous detachment of both eyes 01/19/2020   Class 2 obesity 11/06/2017   Constipation 10/03/2015   Esophageal reflux  10/03/2015   Essential hypertension 10/03/2015   History of breast cancer 10/03/2015   History of IBS 10/03/2015   History of orthostatic hypotension 10/03/2015   Hyperlipemia 10/03/2015   Insomnia 10/03/2015   Pain, joint, multiple sites 10/03/2015   S/P partial thyroidectomy 10/03/2015   Situational anxiety 10/03/2015   Stage 3 chronic kidney disease (HCC) 10/03/2015   Left lumbar radiculopathy 06/30/2014   Neurogenic claudication 06/30/2014   Other intervertebral disc degeneration, lumbar region 06/30/2014     REFERRING DIAG: right breast cancer at risk for lymphedema  THERAPY DIAG:  Aftercare following surgery for neoplasm  PERTINENT HISTORY: 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 It measures 1.9 cm and is located in the upper-outer quadrant. 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 She will be having neoadjuvant chemotherapy starting next week. She had a routine mammogram which was negative, but requested an MRI because of C density breasts. She had a prior left Lumpectomy for DCIS in 2004 . On 09/14/2022 she had a right Lumpectomy with SLNB with 0/1 LN. She will have radiation consult Feb. 14,2024   PRECAUTIONS: right UE Lymphedema risk, None  SUBJECTIVE: Pt returns for her 1 month L-Dex screen after having a high change from baseline indicating subclinical lymphedema. "I wore that sleeve every day."  PAIN:  Are you having pain? No  SOZO SCREENING: Patient was assessed today using the SOZO machine to determine the lymphedema index score. This was compared to her baseline score. It was determined that she is within the recommended range when compared to her baseline and no further action is needed at this time. She will continue SOZO screenings. These are done every 3 months for 2 years post operatively followed by every 6 months for 2 years, and then annually.   L-DEX FLOWSHEETS - 07/23/23 1600       L-DEX LYMPHEDEMA SCREENING   Measurement Type Unilateral    L-DEX MEASUREMENT EXTREMITY Upper Extremity    POSITION  Standing    DOMINANT SIDE Right    At Risk Side Right    BASELINE SCORE (UNILATERAL) 3.6    L-DEX SCORE (UNILATERAL) -3.2    VALUE CHANGE (UNILAT) -6.8            P: Pt to resume every 3 month L-Dex screens.   Hermenia Bers, PTA 07/23/2023, 4:06 PM

## 2023-08-16 ENCOUNTER — Telehealth: Payer: Self-pay | Admitting: Neurology

## 2023-08-16 NOTE — Telephone Encounter (Signed)
Spoke to [facility] and they stated they never received the faxed referral, I verified the number it was faxed to and then faxed it again. They asked for patient's insurance info and stated they would keep an eye on their faxes for the referral.  I followed up with the patient and she is aware that the referral was re-faxed and will follow up with them and/or [place] if she does not hear back from them.

## 2023-08-16 NOTE — Telephone Encounter (Signed)
Can we resend referral please?  Copied from CRM [id number]. Topic: Referral - Status >> Aug 16, 2023  9:07 AM [place] A wrote: Reason for CRM: Patient called to inquire about the referral sent to [facility]. Agent read notes to inform her that patient the referral was sent on 07/13/23. Patient said that she called [facility] and they said they they never received the referral. Please call patient back regarding this.

## 2023-08-25 ENCOUNTER — Encounter (HOSPITAL_BASED_OUTPATIENT_CLINIC_OR_DEPARTMENT_OTHER): Payer: Self-pay

## 2023-08-25 ENCOUNTER — Emergency Department (HOSPITAL_BASED_OUTPATIENT_CLINIC_OR_DEPARTMENT_OTHER)
Admission: EM | Admit: 2023-08-25 | Discharge: 2023-08-25 | Disposition: A | Discharge status: Home / Self Care | Payer: Medicare Other | Attending: Emergency Medicine | Admitting: Emergency Medicine

## 2023-08-25 ENCOUNTER — Other Ambulatory Visit: Payer: Self-pay

## 2023-08-25 DIAGNOSIS — K649 Unspecified hemorrhoids: Secondary | ICD-10-CM | POA: Insufficient documentation

## 2023-08-25 DIAGNOSIS — R1032 Left lower quadrant pain: Secondary | ICD-10-CM

## 2023-08-25 DIAGNOSIS — Z853 Personal history of malignant neoplasm of breast: Secondary | ICD-10-CM | POA: Diagnosis not present

## 2023-08-25 LAB — CBC
HCT: 37.6 % (ref 36.0–46.0)
Hemoglobin: 12.6 g/dL (ref 12.0–15.0)
MCH: 29.9 pg (ref 26.0–34.0)
MCHC: 33.5 g/dL (ref 30.0–36.0)
MCV: 89.1 fL (ref 80.0–100.0)
Platelets: 213 10*3/uL (ref 150–400)
RBC: 4.22 MIL/uL (ref 3.87–5.11)
RDW: 13.2 % (ref 11.5–15.5)
WBC: 5.4 10*3/uL (ref 4.0–10.5)
nRBC: 0 % (ref 0.0–0.2)

## 2023-08-25 LAB — COMPREHENSIVE METABOLIC PANEL
ALT: 21 U/L (ref 0–44)
AST: 23 U/L (ref 15–41)
Albumin: 4.2 g/dL (ref 3.5–5.0)
Alkaline Phosphatase: 51 U/L (ref 38–126)
Anion gap: 9 (ref 5–15)
BUN: 13 mg/dL (ref 8–23)
CO2: 23 mmol/L (ref 22–32)
Calcium: 9.7 mg/dL (ref 8.9–10.3)
Chloride: 105 mmol/L (ref 98–111)
Creatinine, Ser: 0.94 mg/dL (ref 0.44–1.00)
GFR, Estimated: >60 mL/min (ref >60)
Glucose, Bld: 127 mg/dL — ABNORMAL HIGH (ref 70–99)
Potassium: 3 mmol/L — ABNORMAL LOW (ref 3.5–5.1)
Sodium: 137 mmol/L (ref 135–145)
Total Bilirubin: 0.6 mg/dL (ref <1.2)
Total Protein: 7.3 g/dL (ref 6.5–8.1)

## 2023-08-25 LAB — URINALYSIS, ROUTINE W REFLEX MICROSCOPIC
Bilirubin Urine: NEGATIVE
Glucose, UA: NEGATIVE mg/dL
Hgb urine dipstick: NEGATIVE
Ketones, ur: NEGATIVE mg/dL
Nitrite: NEGATIVE
Protein, ur: NEGATIVE mg/dL
Specific Gravity, Urine: <=1.005 (ref 1.005–1.030)
pH: 6 (ref 5.0–8.0)

## 2023-08-25 LAB — LIPASE, BLOOD: Lipase: 37 U/L (ref 11–51)

## 2023-08-25 LAB — URINALYSIS, MICROSCOPIC (REFLEX)

## 2023-08-25 LAB — OCCULT BLOOD X 1 CARD TO LAB, STOOL: Fecal Occult Bld: NEGATIVE

## 2023-08-25 MED ORDER — HYDROCORTISONE (PERIANAL) 2.5 % EX CREA: 1.0000 | TOPICAL_CREAM | Freq: Two times a day (BID) | CUTANEOUS | 0 refills | Status: DC

## 2023-08-25 MED ORDER — POTASSIUM CHLORIDE ER 10 MEQ PO TBCR: 10.0000 meq | EXTENDED_RELEASE_TABLET | Freq: Every day | ORAL | 0 refills | Status: DC

## 2023-08-25 NOTE — ED Provider Notes (Signed)
[facility] Provider Note   CSN: [id number] Arrival date & time: 08/25/23  1154     History  Chief Complaint  Patient presents with   Abdominal Pain    Karina Ferguson is a 67 y.o. female with history of constipation, breast cancer, IBS, presents with concern for bleeding when she had a bowel movement this morning.  States when she wiped, she noticed the blood on the toilet paper.  Has not had any further bleeding.  Does report a history of hemorrhoids. Reports some left sided abdominal pain as well. Denies any dizziness, shortness of breath, black stools.  Reports she has been struggling with constipation and takes Metamucil for this.  Denies any fever, chills, nausea or vomiting.   Abdominal Pain      Home Medications Prior to Admission medications   Medication Sig Start Date End Date Taking? Authorizing Provider  hydrocortisone (ANUSOL-HC) 2.5 % rectal cream Place 1 Application rectally 2 (two) times daily for 7 days. 08/25/23 09/01/23 Yes Arabella Merles, PA-C  potassium chloride (KLOR-CON) 10 MEQ tablet Take 1 tablet (10 mEq total) by mouth daily for 5 days. 08/25/23 08/30/23 Yes Arabella Merles, PA-C  albuterol (VENTOLIN HFA) 108 (90 Base) MCG/ACT inhaler Inhale 1-2 puffs into the lungs every 6 (six) hours as needed. 01/18/23   Clayborne Dana, NP  amLODipine (NORVASC) 10 MG tablet Take 10 mg by mouth daily. 02/12/20   [provider]  cholecalciferol (VITAMIN D3) 25 MCG (1000 UNIT) tablet Take 1,000 Units by mouth daily. 10/03/15   [provider]  gabapentin (NEURONTIN) 100 MG capsule Take 100 mg by mouth daily as needed (pain). PRN for back pain 02/13/20   [provider]  hydrochlorothiazide (HYDRODIURIL) 25 MG tablet Take 25 mg by mouth daily. 11/03/20   [provider]  HYDROcodone bit-homatropine (HYCODAN) 5-1.5 MG/5ML syrup Take 5 mLs by mouth every 6 (six) hours as needed for cough. 03/06/23   Melene Plan, DO  metoprolol succinate (TOPROL-XL) 50 MG 24 hr tablet TAKE 1 TABLET DAILY 03/12/23   Hyman Hopes B, NP  montelukast (SINGULAIR) 10 MG tablet Take 1 tablet (10 mg total) by mouth daily. 09/28/22   Clayborne Dana, NP  Multiple Vitamin (MULTI-VITAMIN) tablet Take 1 tablet by mouth daily.    [provider]  Omega-3 Fatty Acids (FISH OIL) 1000 MG CAPS Take 1,000 mg by mouth daily.    [provider]  omeprazole (PRILOSEC) 20 MG capsule Take 1 capsule (20 mg total) by mouth daily. 09/28/22   Clayborne Dana, NP  ondansetron (ZOFRAN-ODT) 4 MG disintegrating tablet 4mg  ODT q4 hours prn nausea/vomit 03/06/23   Melene Plan, DO  rosuvastatin (CRESTOR) 5 MG tablet Take 1 tablet (5 mg total) by mouth daily. 10/30/22   Clayborne Dana, NP  vitamin B-12 (CYANOCOBALAMIN) 500 MCG tablet Take 500 mcg by mouth daily.    [provider]      Allergies    Amoxicillin, Esomeprazole magnesium, and Rabeprazole    Review of Systems   Review of Systems  Gastrointestinal:  Positive for abdominal pain.    Physical Exam Updated Vital Signs BP (!) 140/78   Pulse 77   Temp 98.1 F (36.7 C)   Resp 18   Ht 5\' 9"  (1.753 m)   Wt 99.8 kg   SpO2 100%   BMI 32.49 kg/m  Physical Exam Vitals and nursing note reviewed. Exam conducted with a chaperone present.  Constitutional:  General: She is not in acute distress.    Appearance: She is well-developed.     Comments: No emesis  HENT:     Head: Normocephalic and atraumatic.  Eyes:     Conjunctiva/sclera: Conjunctivae normal.  Cardiovascular:     Rate and Rhythm: Normal rate and regular rhythm.     Heart sounds: No murmur heard. Pulmonary:     Effort: Pulmonary effort is normal. No respiratory distress.     Breath sounds: Normal breath sounds.  Abdominal:     Palpations: Abdomen is soft.     Tenderness: There is no abdominal tenderness.     Comments: Abdomen is soft and nontender, no rebound or guarding  Genitourinary:     Comments: Nurse Zella Ball present for exam External nonthrombosed hemorrhoid present Stool brown appearing Musculoskeletal:        General: No swelling.     Cervical back: Neck supple.  Skin:    General: Skin is warm and dry.     Capillary Refill: Capillary refill takes less than 2 seconds.  Neurological:     Mental Status: She is alert.  Psychiatric:        Mood and Affect: Mood normal.     ED Results / Procedures / Treatments   Labs (all labs ordered are listed, but only abnormal results are displayed) Labs Reviewed  COMPREHENSIVE METABOLIC PANEL - Abnormal; Notable for the following components:      Result Value   Potassium 3.0 (*)    Glucose, Bld 127 (*)    All other components within normal limits  URINALYSIS, ROUTINE W REFLEX MICROSCOPIC - Abnormal; Notable for the following components:   Color, Urine STRAW (*)    Leukocytes,Ua SMALL (*)    All other components within normal limits  URINALYSIS, MICROSCOPIC (REFLEX) - Abnormal; Notable for the following components:   Bacteria, UA FEW (*)    All other components within normal limits  LIPASE, BLOOD  CBC  OCCULT BLOOD X 1 CARD TO LAB, STOOL    EKG None  Radiology No results found.  Procedures Procedures    Medications Ordered in ED Medications - No data to display  ED Course/ Medical Decision Making/ A&P                                 Medical Decision Making Amount and/or Complexity of Data Reviewed Labs: ordered.     Differential diagnosis includes but is not limited to Cholelithiasis, cholangitis, choledocholithiasis, peptic ulcer, gastritis, gastroenteritis, appendicitis, IBS, IBD, DKA, nephrolithiasis, UTI, pyelonephritis, pancreatitis, diverticulitis, mesenteric ischemia, abdominal aortic aneurysm, small bowel obstruction, volvulus, testicular torsion, ovarian torsion, and females of childbearing age pregnancy   ED Course:  Patient well-appearing, stable vital signs.  No emesis.  Reports an episode  of bright red blood when she wiped this morning.  Has a history of constipation and also reports she was told she had hemorrhoids previously.  On rectal exam, has a nonthrombosed external hemorrhoid.  On Hemoccult testing negative, stool brown appearing. Low concern for any GI bleed at this time. Abdomen soft and nontender to palpation, although patient reports some mild pain on the left side of her abdomen.   CBC without leukocytosis, CMP without any elevation LFTs, creatinine normal.  Slight hypokalemia at 3.0.  Lipase within normal limits.  Urinalysis with small leukocytes, but patient denies any dysuria, hematuria, increased frequency.  Low suspicion for UTI at this time. Given  exam, I feel hemorrhoids the most likely explanation as to her bleeding at this time.  However, could consider diverticulitis given left sided pain.  She does not have any leukocytosis, no abdominal tenderness on palpation, and hemorrhoids a likely cause to her bleeding she saw this morning, low concern for any acute abdominal pathology.  I discussed with patient dietary modification at home for treatment if there is a component of diverticulitis with clear liquid diet and progressing diet as tolerated.  Tylenol and ibuprofen at home as needed for pain.  Hydrocortisone cream to help treat hemorrhoid.  She states she has an appointment with her PCP next week, encouraged her to attend this appointment for recheck of symptoms. Patient is stable and appropriate for discharge home at this time.   Impression: External nonthrombosed hemorrhoid Left-sided abdominal pain  Disposition:  The patient was discharged home with instructions to use bowel regimen of her Metamucil and MiraLAX to maintain normal bowel movements.  Fiber filled diet and fluids.  Avoid straining when using the bathroom.  Hydrocortisone cream prescribed for her hemorrhoid.  Potassium supplement prescribed.  Clear liquid diet for the next 1 to 2 days, then progress as  tolerated. Return precautions given.  L            Final Clinical Impression(s) / ED Diagnoses Final diagnoses:  Acute hemorrhoid  Left lower quadrant abdominal pain    Rx / DC Orders ED Discharge Orders          Ordered    potassium chloride (KLOR-CON) 10 MEQ tablet  Daily        08/25/23 1555    hydrocortisone (ANUSOL-HC) 2.5 % rectal cream  2 times daily        08/25/23 1555              Arabella Merles, [place] 08/25/23 1600

## 2023-08-25 NOTE — ED Triage Notes (Signed)
The patient having left side abd pain and blood in stool today.

## 2023-08-25 NOTE — ED Notes (Signed)
Reviewed discharge papers and follow up with pt. Pt states understanding. Ambulatory at time of discharge with family

## 2023-08-25 NOTE — Discharge Instructions (Addendum)
We checked your blood counts today which are normal.  Your electrolytes are all normal aside from a slightly low potassium today at 3.0.  You have been prescribed a potassium supplement to take.  Your kidney, liver, and pancreas labs are normal today.  Your urine does not show any signs of infection.  We do not see any blood in the stool.  The bleeding you noticed this morning is likely due to your hemorrhoid.  You are being prescribed a cream to use on the area to help with symptoms.  Please take this as prescribed.  Drink plenty of fluids at home and eat a fiber fillet diet (lots of fruits and vegetables). Please engage in daily exercise as this also helps to maintain regular bowel movements.   Try to use the bathroom after eating a meal. Place your feet up on a small stepstool when trying to have a bowel movement.  Please avoid straining when using the restroom.  You may continue using your Metamucil at home to help with constipation. You may also take 1 scoop of Miralax in the morning to help you maintain normal bowel movements.   Please use a clear liquid diet for the next 1-2 days (soups, smoothies, tea, etc.) to allow your bowels to rest.  You may then advance your diet as tolerated.  Return the ER for any worsening abdominal pain, nausea or vomiting, fevers, any other new or concerning symptoms.

## 2023-08-30 ENCOUNTER — Ambulatory Visit (INDEPENDENT_AMBULATORY_CARE_PROVIDER_SITE_OTHER): Payer: Medicare Other | Admitting: Family Medicine

## 2023-08-30 ENCOUNTER — Ambulatory Visit (HOSPITAL_BASED_OUTPATIENT_CLINIC_OR_DEPARTMENT_OTHER)
Admission: RE | Admit: 2023-08-30 | Discharge: 2023-08-30 | Disposition: A | Discharge status: Home / Self Care | Payer: Medicare Other | Source: Ambulatory Visit | Attending: Family Medicine | Admitting: Family Medicine

## 2023-08-30 ENCOUNTER — Encounter: Payer: Self-pay | Admitting: Family Medicine

## 2023-08-30 VITALS — BP 132/65 | HR 83 | Ht 69.0 in | Wt 238.0 lb

## 2023-08-30 DIAGNOSIS — R200 Anesthesia of skin: Secondary | ICD-10-CM | POA: Insufficient documentation

## 2023-08-30 DIAGNOSIS — R202 Paresthesia of skin: Secondary | ICD-10-CM | POA: Insufficient documentation

## 2023-08-30 DIAGNOSIS — Z23 Encounter for immunization: Secondary | ICD-10-CM

## 2023-08-30 NOTE — Progress Notes (Signed)
 Acute Office Visit  Subjective:     Patient ID: Karina Ferguson, female    DOB: April 11, 1956, 68 y.o.   MRN: [id number]  Chief Complaint  Patient presents with   Foot Pain    Foot Pain   Patient is in today for foot/hand pain/tingling.   Discussed the use of AI scribe software for clinical note transcription with the patient, who gave verbal consent to proceed.  History of Present Illness   The patient presents with complaints of tingling in both hands and feet, more pronounced in the left hand. The sensation is described as 'a little tingly,' akin to the extremity being asleep and needing to be woken up. The patient denies any associated pain, rashes, or dropping of objects. They have an upcoming appointment with a neurologist for further evaluation.  In addition, the patient experienced an episode of severe stomach pain, prompting a visit to [facility] ED. They have a history of stomach issues and were diagnosed with a small hemorrhoid during a colonoscopy a few years ago. During the recent visit, the provider suspected diverticulitis. The patient reported seeing a small amount of blood on the tissue after using the bathroom, but no blood was found in the stool sample. The patient was prescribed a cream for the external hemorrhoid and advised to continue with Metamucil and supportive measures. Reports symptoms have mostly resolved. No acute concerns today.            ROS All review of systems negative except what is listed in the HPI      Objective:    BP 132/65   Pulse 83   Ht 5' 9 (1.753 m)   Wt 238 lb (108 kg)   SpO2 100%   BMI 35.15 kg/m    Physical Exam Vitals reviewed.  Constitutional:      Appearance: Normal appearance.  Cardiovascular:     Rate and Rhythm: Normal rate and regular rhythm.     Pulses: Normal pulses.     Heart sounds: Normal heart sounds.  Pulmonary:     Effort: Pulmonary effort is normal.     Breath sounds: Normal breath sounds.   Musculoskeletal:        General: No swelling or tenderness. Normal range of motion.     Comments: Normal grip strength  Skin:    General: Skin is warm and dry.  Neurological:     Mental Status: She is alert and oriented to person, place, and time.  Psychiatric:        Mood and Affect: Mood normal.        Behavior: Behavior normal.        Thought Content: Thought content normal.        Judgment: Judgment normal.     No results found for any visits on 08/30/23.      Assessment & Plan:   Problem List Items Addressed This Visit   None Visit Diagnoses       Paresthesias    -  Primary   Relevant Orders   B12 and Folate Panel   DG Cervical Spine Complete     Bilateral hand numbness       Relevant Orders   B12 and Folate Panel   DG Cervical Spine Complete     Flu vaccine need       Relevant Orders   Flu Vaccine Trivalent High Dose (Fluad) (Completed)      Bilateral hand numbness/tingling, worse on the left. No associated weakness or  dropping objects. Neurology consult scheduled for later this month. -Order cervical spine X-ray to rule out cervical radiculopathy. -Recheck B12 levels to rule out deficiency. Previously stopped supplement due to elevated B12 levels.       No orders of the defined types were placed in this encounter.   Return if symptoms worsen or fail to improve.  Waddell KATHEE Mon, NP

## 2023-08-31 LAB — B12 AND FOLATE PANEL
Folate: >24.2 ng/mL (ref >5.9)
Vitamin B-12: 1118 pg/mL — ABNORMAL HIGH (ref 211–911)

## 2023-10-09 ENCOUNTER — Other Ambulatory Visit: Payer: Self-pay | Admitting: Neurology

## 2023-10-09 DIAGNOSIS — R200 Anesthesia of skin: Secondary | ICD-10-CM

## 2023-10-16 ENCOUNTER — Telehealth: Payer: Self-pay | Admitting: Family Medicine

## 2023-10-16 NOTE — Telephone Encounter (Signed)
Copied from CRM [id number]. Topic: Medicare AWV >> Oct 16, 2023  3:08 PM Payton Doughty wrote: Reason for CRM: Called LVM 10/16/2023 to schedule AWV. Please schedule Virtual or Telehealth visits ONLY.   Verlee Rossetti; Care Guide Ambulatory Clinical Support [facility] Direct Dial: [phone]

## 2023-10-22 ENCOUNTER — Ambulatory Visit: Payer: Medicare Other | Attending: General Surgery | Admitting: Physical Therapy

## 2023-10-22 DIAGNOSIS — Z483 Aftercare following surgery for neoplasm: Secondary | ICD-10-CM | POA: Insufficient documentation

## 2023-10-22 NOTE — Therapy (Signed)
 OUTPATIENT PHYSICAL THERAPY SOZO SCREENING NOTE   Patient Name: Karina Ferguson MRN: [id number] DOB:1956/05/05, 68 y.o., female Today's Date: 10/22/2023  PCP: Clayborne Dana, NP REFERRING PROVIDER: Emelia Loron, MD   PT End of Session - 10/22/23 1611     Visit Number 2   unchanged due to screen only   PT Start Time 1611    PT Stop Time 1614    PT Time Calculation (min) 3 min             Past Medical History:  Diagnosis Date   Allergic rhinitis    Allergy    Anemia    Anxiety    Arthritis    Breast cancer (HCC) 06/05/2022   Cancer (HCC) 04/2022   right breast IDC/DCIS   Chronic idiopathic constipation    GERD (gastroesophageal reflux disease)    Hyperlipidemia    Hypertension    Insomnia    Personal history of chemotherapy    Personal history of radiation therapy    Sleep apnea    wears CPAP nightly   Stage 3 chronic kidney disease (HCC)    Thyroid disease    Vertigo    Past Surgical History:  Procedure Laterality Date   ABDOMINAL HYSTERECTOMY     BREAST BIOPSY  09/13/2022   MM RT RADIOACTIVE SEED LOC MAMMO GUIDE 09/13/2022 [facility]   BREAST BIOPSY  09/13/2022   MM RT RADIOACTIVE SEED EA ADD LESION LOC MAMMO GUIDE 09/13/2022 [facility]   BREAST LUMPECTOMY Right 09/14/2022   BREAST LUMPECTOMY Left 2004   BREAST LUMPECTOMY WITH RADIOACTIVE SEED AND SENTINEL LYMPH NODE BIOPSY Right 09/14/2022   Procedure: RIGHT BREAST LUMPECTOMY WITH RADIOACTIVE SEED;  Surgeon: Emelia Loron, MD;  Location: [facility] OR;  Service: General;  Laterality: Right;   Left breast cancer     20 years ago   PORT-A-CATH REMOVAL N/A 09/14/2022   Procedure: REMOVAL PORT-A-CATH;  Surgeon: Emelia Loron, MD;  Location: [facility] OR;  Service: General;  Laterality: N/A;   PORTACATH PLACEMENT Right 06/19/2022   Procedure: PORT PLACEMENT WITH ULTRASOUND GUIDANCE;  Surgeon: Emelia Loron, MD;  Location: [facility];  Service: General;  Laterality: Right;    RADIOACTIVE SEED GUIDED AXILLARY SENTINEL LYMPH NODE Right 09/14/2022   Procedure: Right AXILLARY SENTINEL LYMPH NODE;  Surgeon: Emelia Loron, MD;  Location: [facility] OR;  Service: General;  Laterality: Right;   RADIOACTIVE SEED GUIDED EXCISIONAL BREAST BIOPSY Right 09/14/2022   Procedure: RADIOACTIVE SEED GUIDED EXCISIONAL BREAST BIOPSY;  Surgeon: Emelia Loron, MD;  Location: [facility] OR;  Service: General;  Laterality: Right;   THYROIDECTOMY, PARTIAL     Patient Active Problem List   Diagnosis Date Noted   Status post right breast lumpectomy 09/14/2022   Port-A-Cath in place 07/27/2022   Genetic testing 07/14/2022   Malignant neoplasm of upper-outer quadrant of right breast in female, estrogen receptor negative (HCC) 06/15/2022   Moderate obstructive sleep apnea 02/17/2022   Snoring 10/04/2021   Allergic rhinitis 10/04/2021   Acute bacterial rhinosinusitis 08/31/2021   Upper airway cough syndrome 08/31/2021   Irritable bowel syndrome 04/11/2021   Impaired fasting glucose 02/12/2020   Keratoconjunctivitis sicca of both eyes not specified as Sjogren's 01/19/2020   Nuclear sclerotic cataract of both eyes 01/19/2020   Posterior vitreous detachment of both eyes 01/19/2020   Class 2 obesity 11/06/2017   Constipation 10/03/2015   Esophageal reflux 10/03/2015   Essential hypertension 10/03/2015   History of breast cancer 10/03/2015   History of IBS 10/03/2015  History of orthostatic hypotension 10/03/2015   Hyperlipemia 10/03/2015   Insomnia 10/03/2015   Pain, joint, multiple sites 10/03/2015   S/P partial thyroidectomy 10/03/2015   Situational anxiety 10/03/2015   Stage 3 chronic kidney disease (HCC) 10/03/2015   Left lumbar radiculopathy 06/30/2014   Neurogenic claudication 06/30/2014   Other intervertebral disc degeneration, lumbar region 06/30/2014    REFERRING DIAG: right breast cancer at risk for lymphedema  THERAPY DIAG:  Aftercare following surgery for  neoplasm  PERTINENT HISTORY: 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 It measures 1.9 cm and is located in the upper-outer quadrant. 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 She will be having neoadjuvant chemotherapy starting next week. She had a routine mammogram which was negative, but requested an MRI because of C density breasts. She had a prior left Lumpectomy for DCIS in 2004 . On 09/14/2022 she had a right Lumpectomy with SLNB with 0/1 LN. She will have radiation consult Feb. 14,2024   PRECAUTIONS: right UE Lymphedema risk, None  SUBJECTIVE: Pt returns for her 3 month SOZO  PAIN:  Are you having pain? No  SOZO SCREENING: Patient was assessed today using the SOZO machine to determine the lymphedema index score. This was compared to her baseline score. It was determined that she is within the recommended range when compared to her baseline and no further action is needed at this time. She will continue SOZO screenings. These are done every 3 months for 2 years post operatively followed by every 6 months for 2 years, and then annually.   L-DEX FLOWSHEETS - 10/22/23 1600       L-DEX LYMPHEDEMA SCREENING   Measurement Type Unilateral    L-DEX MEASUREMENT EXTREMITY Upper Extremity    POSITION  Standing    DOMINANT SIDE Right    At Risk Side Right    BASELINE SCORE (UNILATERAL) 3.6    L-DEX SCORE (UNILATERAL) 2.4    VALUE CHANGE (UNILAT) -1.2             P: Pt to resume every 3 month L-Dex screens.   [address] Tomas de Castro, PT 10/22/2023, 4:15 PM

## 2023-10-24 ENCOUNTER — Encounter: Payer: Self-pay | Admitting: Hematology and Oncology

## 2023-10-24 ENCOUNTER — Encounter: Payer: Self-pay | Admitting: Family Medicine

## 2023-10-24 ENCOUNTER — Ambulatory Visit (INDEPENDENT_AMBULATORY_CARE_PROVIDER_SITE_OTHER): Payer: Medicare Other | Admitting: Family Medicine

## 2023-10-24 ENCOUNTER — Other Ambulatory Visit (HOSPITAL_BASED_OUTPATIENT_CLINIC_OR_DEPARTMENT_OTHER): Payer: Self-pay

## 2023-10-24 VITALS — BP 130/71 | HR 84 | Temp 98.1°F | Ht 69.0 in | Wt 233.0 lb

## 2023-10-24 DIAGNOSIS — R051 Acute cough: Secondary | ICD-10-CM | POA: Diagnosis not present

## 2023-10-24 DIAGNOSIS — J0140 Acute pansinusitis, unspecified: Secondary | ICD-10-CM | POA: Diagnosis not present

## 2023-10-24 MED ORDER — HYDROCODONE BIT-HOMATROP MBR 5-1.5 MG/5ML PO SOLN
5.0000 mL | Freq: Three times a day (TID) | ORAL | 0 refills | Status: DC | PRN
  Filled 2023-10-24: qty 75, 5d supply, fill #0

## 2023-10-24 MED ORDER — AZITHROMYCIN 250 MG PO TABS
ORAL_TABLET | ORAL | 0 refills | Status: AC
  Filled 2023-10-24: qty 6, 5d supply, fill #0

## 2023-10-24 NOTE — Progress Notes (Signed)
 Acute Office Visit  Subjective:     Patient ID: Karina Ferguson, female    DOB: 1956/01/19, 68 y.o.   MRN: [id number]  Chief Complaint  Patient presents with   Cough     Patient is in today for cough, sinus pressure.    Discussed the use of AI scribe software for clinical note transcription with the patient, who gave verbal consent to proceed.  History of Present Illness The patient presents with a persistent cough and respiratory symptoms for one week.  Symptoms began with a sore throat and progressed to sneezing and a severe cough, described as 'coughing like crazy.' The cough is particularly bothersome at night, causing significant discomfort and pain in the throat. It is accompanied by a scratchy throat, watery eyes, sinus pressure behind the eyes, and a runny nose. No fever is present, but there is wheezing, audible to others.  The patient has been taking Mucinex, Nyquil, and Tessalon Perles without relief. They are allergic to penicillin and amoxicillin but tolerated azithromycin well in the past.  They attended a gathering on Saturday despite feeling slightly sick, where they hugged others who were not visibly ill. They have not been around anyone else who was sick prior to the onset of their symptoms.         All review of systems negative except what is listed in the HPI      Objective:    BP 130/71   Pulse 84   Temp 98.1 F (36.7 C) (Oral)   Ht 5\' 9"  (1.753 m)   Wt 233 lb (105.7 kg)   SpO2 99%   BMI 34.41 kg/m    Physical Exam Vitals reviewed.  Constitutional:      Appearance: Normal appearance.  HENT:     Head: Normocephalic and atraumatic.     Right Ear: Tympanic membrane normal.     Left Ear: Tympanic membrane normal.     Nose: Congestion and rhinorrhea present.     Mouth/Throat:     Mouth: Mucous membranes are moist.     Pharynx: Oropharynx is clear.  Eyes:     Extraocular Movements: Extraocular movements intact.  Cardiovascular:     Rate  and Rhythm: Normal rate and regular rhythm.  Pulmonary:     Effort: Pulmonary effort is normal.     Breath sounds: Normal breath sounds. No wheezing, rhonchi or rales.  Musculoskeletal:     Cervical back: Normal range of motion and neck supple.  Lymphadenopathy:     Cervical: No cervical adenopathy.  Skin:    General: Skin is warm and dry.  Neurological:     Mental Status: She is alert and oriented to person, place, and time.  Psychiatric:        Mood and Affect: Mood normal.        Behavior: Behavior normal.        Thought Content: Thought content normal.        Judgment: Judgment normal.     No results found for any visits on 10/24/23.      Assessment & Plan:   Problem List Items Addressed This Visit   None Visit Diagnoses       Acute non-recurrent pansinusitis    -  Primary   Relevant Medications   azithromycin (ZITHROMAX) 250 MG tablet   HYDROcodone bit-homatropine (HYCODAN) 5-1.5 MG/5ML syrup     Acute cough       Relevant Medications   azithromycin (ZITHROMAX) 250 MG tablet  HYDROcodone bit-homatropine (HYCODAN) 5-1.5 MG/5ML syrup        Assessment & Plan Upper Respiratory Infection: Persistent cough, sore throat, and nasal congestion for over a week. No fever. No improvement with Mucinex, Nyquil, and Tessalon Perles. -Start Zpak - allergic to amoxicillin -Continue Mucinex. -Prescribe cough syrup with codeine for symptomatic relief, to be used primarily at night.          Meds ordered this encounter  Medications   azithromycin (ZITHROMAX) 250 MG tablet    Sig: Take 2 tablets on day 1, then 1 tablet daily on days 2 through 5    Dispense:  6 tablet    Refill:  0    Supervising Provider:   Danise Edge A [[id number]]   HYDROcodone bit-homatropine (HYCODAN) 5-1.5 MG/5ML syrup    Sig: Take 5 mLs by mouth every 8 (eight) hours as needed for up to 5 days for cough.    Dispense:  75 mL    Refill:  0    Supervising Provider:   Danise Edge A [[id number]]     Return if symptoms worsen or fail to improve.  Clayborne Dana, NP

## 2023-10-31 ENCOUNTER — Telehealth: Payer: Self-pay

## 2023-10-31 DIAGNOSIS — R051 Acute cough: Secondary | ICD-10-CM

## 2023-10-31 DIAGNOSIS — J0140 Acute pansinusitis, unspecified: Secondary | ICD-10-CM

## 2023-10-31 MED ORDER — HYDROCODONE BIT-HOMATROP MBR 5-1.5 MG/5ML PO SOLN: 5.0000 mL | Freq: Three times a day (TID) | ORAL | 0 refills | Status: AC | PRN

## 2023-10-31 NOTE — Telephone Encounter (Signed)
 Copied from CRM [id number]. Topic: Clinical - Medication Question >> Oct 31, 2023 12:05 PM Almira Coaster wrote: Reason for CRM: Patient is calling because she was seen on 10/24/2023 and prescribed HYDROcodone bit-homatropine (HYCODAN) 5-1.5 MG/5ML syrup, Hyman Hopes advised patient if she needed more to call and we can send some to her pharmacy, patient would like a bit more due to still having a lingering cough, she would like for it to be sent to [id number] - [place], [place] - [address]

## 2023-10-31 NOTE — Telephone Encounter (Signed)
 Pt requesting refill on hycodan syrup

## 2023-11-26 ENCOUNTER — Telehealth: Payer: Self-pay

## 2023-11-26 MED ORDER — AMLODIPINE BESYLATE 10 MG PO TABS: 10.0000 mg | ORAL_TABLET | Freq: Every day | ORAL | 1 refills | Status: DC

## 2023-11-26 NOTE — Telephone Encounter (Signed)
 Called patient. She just needed refills of Amlodipine to [facility]. RX sent.

## 2023-11-26 NOTE — Addendum Note (Signed)
 Addended by: Silvio Pate on: 11/26/2023 02:37 PM   Modules accepted: Orders

## 2023-11-26 NOTE — Telephone Encounter (Signed)
 Copied from CRM [id number]. Topic: General - Other >> Nov 26, 2023 12:34 PM Rodman Pickle T wrote: Reason for CRM: patient is needing for dr beck to do a pa for her medication she needs for dr Reola Calkins to call her pharmacy

## 2023-12-26 ENCOUNTER — Telehealth: Payer: Self-pay

## 2023-12-26 NOTE — Telephone Encounter (Signed)
 Attempted to call pt regarding guardant results lvm for pt that results was negative and if any questions or concerns to return call back.

## 2023-12-27 ENCOUNTER — Encounter: Payer: Self-pay | Admitting: Hematology and Oncology

## 2023-12-28 ENCOUNTER — Telehealth: Payer: Self-pay | Admitting: Family Medicine

## 2023-12-28 NOTE — Telephone Encounter (Unsigned)
 Copied from CRM [id number]. Topic: Medicare AWV >> Dec 28, 2023 10:20 AM Juliana Ocean wrote: Reason for CRM: LVM 12/27/2023 to schedule AWV. Please schedule Virtual or Telehealth visits ONLY.   Rosalee Collins; Care Guide Ambulatory Clinical Support [place] [facility] Direct Dial: [phone]

## 2024-01-14 ENCOUNTER — Telehealth: Payer: Self-pay | Admitting: Family Medicine

## 2024-01-14 NOTE — Telephone Encounter (Unsigned)
 Copied from CRM [id number]. Topic: Clinical - Medical Advice >> Jan 14, 2024  1:32 PM Clyde Darling P wrote: Reason for CRM: Pt want to make sure it was ok for her to get the shingles vaccine , pt can be reached at [phone]

## 2024-02-12 ENCOUNTER — Telehealth: Payer: Self-pay

## 2024-02-12 NOTE — Telephone Encounter (Signed)
 Copied from CRM [id number]. Topic: Clinical - Red Word Triage >> Feb 12, 2024  2:21 PM Karina Ferguson wrote: Red Word that prompted transfer to Nurse Triage: Pt advise she experience dizziness, lightheaded, congestion, sinus- ask would she like to speak to NT, she advise she just wanted to set an appt, call had disconnected due to system froze   ----------------------------------------------------------------------- From previous Reason for Contact - Scheduling: Patient/patient representative is calling to schedule an appointment. Refer to attachments for appointment information.

## 2024-02-13 ENCOUNTER — Encounter: Payer: Self-pay | Admitting: Family Medicine

## 2024-02-13 ENCOUNTER — Ambulatory Visit (INDEPENDENT_AMBULATORY_CARE_PROVIDER_SITE_OTHER): Admitting: Family Medicine

## 2024-02-13 ENCOUNTER — Other Ambulatory Visit (HOSPITAL_BASED_OUTPATIENT_CLINIC_OR_DEPARTMENT_OTHER): Payer: Self-pay

## 2024-02-13 VITALS — Ht 69.0 in | Wt 239.0 lb

## 2024-02-13 DIAGNOSIS — R42 Dizziness and giddiness: Secondary | ICD-10-CM

## 2024-02-13 DIAGNOSIS — N83209 Unspecified ovarian cyst, unspecified side: Secondary | ICD-10-CM | POA: Insufficient documentation

## 2024-02-13 DIAGNOSIS — K648 Other hemorrhoids: Secondary | ICD-10-CM | POA: Insufficient documentation

## 2024-02-13 DIAGNOSIS — J0140 Acute pansinusitis, unspecified: Secondary | ICD-10-CM

## 2024-02-13 MED ORDER — AZITHROMYCIN 250 MG PO TABS
ORAL_TABLET | ORAL | 0 refills | Status: AC
Start: 2024-02-13 — End: 2024-02-18
  Filled 2024-02-13: qty 6, 5d supply, fill #0

## 2024-02-13 MED ORDER — MECLIZINE HCL 12.5 MG PO TABS
12.5000 mg | ORAL_TABLET | Freq: Three times a day (TID) | ORAL | 0 refills | Status: AC | PRN
  Filled 2024-02-13: qty 30, 10d supply, fill #0

## 2024-02-13 NOTE — Progress Notes (Signed)
 Acute Office Visit  Subjective:     Patient ID: Karina Ferguson, female    DOB: [birth year], 68 y.o.   MRN: [id number]  Chief Complaint  Patient presents with   Dizziness     Patient is in today for dizziness, sinus pain.  Discussed the use of AI scribe software for clinical note transcription with the patient, who gave verbal consent to proceed.  History of Present Illness Karina Ferguson is a 68 year old female who presents with dizziness and balance issues.  She has been experiencing dizziness and balance issues for approximately two weeks, beginning on her [age] birthday ([birth year]). She describes an episode where she lost her balance and felt the room spinning after standing up from a meal. The dizziness subsided once she was seated. The dizziness has been intermittent since then, with episodes occurring randomly, sometimes triggered by movement or changes in position. She experiences nausea associated with the dizziness but no vomiting. She also feels lightheaded when moving and experiences a sensation of imbalance, as if she might fall.  She reports a sensation of hearing her heartbeat in her ears, which comes and goes. She has a history of sinus issues and is currently experiencing nasal congestion and sinus pressure, with sharp, brief pains in her head, primarily in the frontal and occipital regions. Her sinus issues have been worsening with age. Her current medications include loratadine and Singulair  for allergies, and she uses Flonase nasal spray. These treatments have not been effective in alleviating her symptoms.        All review of systems negative except what is listed in the HPI      Objective:    Ht 5' 9 (1.753 m)   Wt 239 lb (108.4 kg)   SpO2 100%   BMI 35.29 kg/m    Orthostatic VS for the past 24 hrs:  BP- Lying Pulse- Lying BP- Sitting Pulse- Sitting BP- Standing at 0 minutes Pulse- Standing at 0 minutes  02/13/24 0926 142/71 72 135/69 72 133/73 80       Physical Exam Vitals reviewed.  Constitutional:      Appearance: Normal appearance.  HENT:     Head: Normocephalic and atraumatic.     Comments: + Dix-Hallpike (R>L)    Right Ear: A middle ear effusion is present.     Left Ear: Tympanic membrane normal.     Nose:     Right Sinus: Frontal sinus tenderness present.     Left Sinus: Frontal sinus tenderness present.     Mouth/Throat:     Mouth: Mucous membranes are moist.     Pharynx: Oropharynx is clear.   Eyes:     Extraocular Movements: Extraocular movements intact.     Pupils: Pupils are equal, round, and reactive to light.    Cardiovascular:     Rate and Rhythm: Normal rate.  Pulmonary:     Effort: Pulmonary effort is normal.   Skin:    General: Skin is warm and dry.   Neurological:     Mental Status: She is alert and oriented to person, place, and time.   Psychiatric:        Mood and Affect: Mood normal.        Behavior: Behavior normal.        Thought Content: Thought content normal.       No results found for any visits on 02/13/24.      Assessment & Plan:   Problem List Items Addressed This Visit  None Visit Diagnoses       Vertigo    -  Primary   Relevant Medications   meclizine (ANTIVERT) 12.5 MG tablet   Other Relevant Orders   Ambulatory referral to Physical Therapy     Acute non-recurrent pansinusitis       Relevant Medications   azithromycin  (ZITHROMAX ) 250 MG tablet        Assessment & Plan Vertigo - Prescribed meclizine 12.5 mg for symptom management. Start with one tablet, increase to two if needed. Advised potential side effects. Recommended avoiding driving while symptomatic or medicated. - Provided Epley maneuver handout for home exercises  - Referred to physical therapy for vestibular rehabilitation.  Acute non-recurrent pansinusitis Sinus congestion and pressure with stabbing pains due to sinus pressure. Fluid behind right ear. Symptoms consistent with sinusitis,  exacerbated by allergies. - Prescribed Z-Pak as she reports this is usually what works best for her - Continued Flonase nasal spray  Intermittent tinnitus likely related to sinus and allergy issues. - Addressed underlying sinus/allergy issues to alleviate tinnitus. - Follow-up if does not resolve with treatment.        Meds ordered this encounter  Medications   meclizine (ANTIVERT) 12.5 MG tablet    Sig: Take 1 tablet (12.5 mg total) by mouth 3 (three) times daily as needed for dizziness.    Dispense:  30 tablet    Refill:  0    Supervising Provider:   Randie Bustle A [[id number]]   azithromycin  (ZITHROMAX ) 250 MG tablet    Sig: Take 2 tablets on day 1, then 1 tablet daily on days 2 through 5    Dispense:  6 tablet    Refill:  0    Supervising Provider:   Randie Bustle A [[id number]]    Return if symptoms worsen or fail to improve.  Karina Hock, NP

## 2024-02-13 NOTE — Telephone Encounter (Signed)
 Pt has appointment with PCP today 02/13/2024.

## 2024-02-18 ENCOUNTER — Ambulatory Visit: Payer: Medicare Other | Attending: General Surgery

## 2024-02-18 VITALS — Wt 238.0 lb

## 2024-02-18 DIAGNOSIS — Z483 Aftercare following surgery for neoplasm: Secondary | ICD-10-CM | POA: Insufficient documentation

## 2024-02-18 NOTE — Therapy (Signed)
 OUTPATIENT PHYSICAL THERAPY SOZO SCREENING NOTE   Patient Name: Karina Ferguson MRN: [id number] DOB:1955/11/15, 68 y.o., female Today's Date: 02/18/2024  PCP: Almarie Waddell NOVAK, NP REFERRING PROVIDER: Ebbie Cough, MD   PT End of Session - 02/18/24 1605     Visit Number 2   # unchnaged due screen only   PT Start Time 1603    PT Stop Time 1607    PT Time Calculation (min) 4 min    Activity Tolerance Patient tolerated treatment well    Behavior During Therapy [facility] for tasks assessed/performed          Past Medical History:  Diagnosis Date   Allergic rhinitis    Allergy    Anemia    Anxiety    Arthritis    Breast cancer (HCC) 06/05/2022   Cancer (HCC) 04/2022   right breast IDC/DCIS   Chronic idiopathic constipation    GERD (gastroesophageal reflux disease)    Hyperlipidemia    Hypertension    Insomnia    Personal history of chemotherapy    Personal history of radiation therapy    Sleep apnea    wears CPAP nightly   Stage 3 chronic kidney disease (HCC)    Thyroid  disease    Vertigo    Past Surgical History:  Procedure Laterality Date   ABDOMINAL HYSTERECTOMY     BREAST BIOPSY  09/13/2022   MM RT RADIOACTIVE SEED LOC MAMMO GUIDE 09/13/2022 [facility]   BREAST BIOPSY  09/13/2022   MM RT RADIOACTIVE SEED EA ADD LESION LOC MAMMO GUIDE 09/13/2022 [facility]   BREAST LUMPECTOMY Right 09/14/2022   BREAST LUMPECTOMY Left 2004   BREAST LUMPECTOMY WITH RADIOACTIVE SEED AND SENTINEL LYMPH NODE BIOPSY Right 09/14/2022   Procedure: RIGHT BREAST LUMPECTOMY WITH RADIOACTIVE SEED;  Surgeon: Ebbie Cough, MD;  Location: [facility] OR;  Service: General;  Laterality: Right;   Left breast cancer     20 years ago   PORT-A-CATH REMOVAL N/A 09/14/2022   Procedure: REMOVAL PORT-A-CATH;  Surgeon: Ebbie Cough, MD;  Location: [facility] OR;  Service: General;  Laterality: N/A;   PORTACATH PLACEMENT Right 06/19/2022   Procedure: PORT PLACEMENT WITH ULTRASOUND GUIDANCE;   Surgeon: Ebbie Cough, MD;  Location: [facility];  Service: General;  Laterality: Right;   RADIOACTIVE SEED GUIDED AXILLARY SENTINEL LYMPH NODE Right 09/14/2022   Procedure: Right AXILLARY SENTINEL LYMPH NODE;  Surgeon: Ebbie Cough, MD;  Location: [facility] OR;  Service: General;  Laterality: Right;   RADIOACTIVE SEED GUIDED EXCISIONAL BREAST BIOPSY Right 09/14/2022   Procedure: RADIOACTIVE SEED GUIDED EXCISIONAL BREAST BIOPSY;  Surgeon: Ebbie Cough, MD;  Location: [facility] OR;  Service: General;  Laterality: Right;   THYROIDECTOMY, PARTIAL     Patient Active Problem List   Diagnosis Date Noted   Internal hemorrhoids 02/13/2024   Ovarian cyst 02/13/2024   Status post right breast lumpectomy 09/14/2022   Port-A-Cath in place 07/27/2022   Genetic testing 07/14/2022   Malignant neoplasm of upper-outer quadrant of right breast in female, estrogen receptor negative (HCC) 06/15/2022   Moderate obstructive sleep apnea 02/17/2022   Snoring 10/04/2021   Allergic rhinitis 10/04/2021   Acute bacterial rhinosinusitis 08/31/2021   Upper airway cough syndrome 08/31/2021   Irritable bowel syndrome 04/11/2021   Impaired fasting glucose 02/12/2020   Keratoconjunctivitis sicca of both eyes not specified as Sjogren's 01/19/2020   Nuclear sclerotic cataract of both eyes 01/19/2020   Posterior vitreous detachment of both eyes 01/19/2020   Class 2 obesity 11/06/2017  Constipation 10/03/2015   Esophageal reflux 10/03/2015   Essential hypertension 10/03/2015   History of breast cancer 10/03/2015   History of IBS 10/03/2015   History of orthostatic hypotension 10/03/2015   Hyperlipemia 10/03/2015   Insomnia 10/03/2015   Pain, joint, multiple sites 10/03/2015   S/P partial thyroidectomy 10/03/2015   Situational anxiety 10/03/2015   Stage 3 chronic kidney disease (HCC) 10/03/2015   Left lumbar radiculopathy 06/30/2014   Neurogenic claudication 06/30/2014   Other intervertebral  disc degeneration, lumbar region 06/30/2014    REFERRING DIAG: right breast cancer at risk for lymphedema  THERAPY DIAG:  Aftercare following surgery for neoplasm  PERTINENT HISTORY: 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 It measures 1.9 cm and is located in the upper-outer quadrant. 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 She will be having neoadjuvant chemotherapy starting next week. She had a routine mammogram which was negative, but requested an MRI because of C density breasts. She had a prior left Lumpectomy for DCIS in 2004 . On 09/14/2022 she had a right Lumpectomy with SLNB with 0/1 LN. She will have radiation consult Feb. 14,2024   PRECAUTIONS: right UE Lymphedema risk, None  SUBJECTIVE: Pt returns for her 3 month SOZO  PAIN:  Are you having pain? No  SOZO SCREENING: Patient was assessed today using the SOZO machine to determine the lymphedema index score. This was compared to her baseline score. It was determined that she is within the recommended range when compared to her baseline and no further action is needed at this time. She will continue SOZO screenings. These are done every 3 months for 2 years post operatively followed by every 6 months for 2 years, and then annually.   L-DEX FLOWSHEETS - 02/18/24 1600       L-DEX LYMPHEDEMA SCREENING   Measurement Type Unilateral    L-DEX MEASUREMENT EXTREMITY Upper Extremity    POSITION  Standing    DOMINANT SIDE Right    At Risk Side Right    BASELINE SCORE (UNILATERAL) 3.6    L-DEX SCORE (UNILATERAL) 6.1    VALUE CHANGE (UNILAT) 2.5          P: Pt to cont every 3 month L-Dex screens.   Aden Berwyn Caldron, PTA 02/18/2024, 4:06 PM

## 2024-02-19 ENCOUNTER — Telehealth: Payer: Self-pay | Admitting: Family Medicine

## 2024-02-19 NOTE — Telephone Encounter (Signed)
 Copied from CRM [id number]. Topic: Medicare AWV >> Feb 19, 2024  9:18 AM Nathanel DEL wrote: Reason for CRM: LVM 02/19/2024 AWV appt time changed from 11:30am to 11am. Please call and confirm time change khc   Nathanel Paschal; Care Guide Ambulatory Clinical Support [place] [facility] Direct Dial: [phone]

## 2024-02-21 ENCOUNTER — Ambulatory Visit

## 2024-02-21 VITALS — Ht 69.0 in | Wt 238.0 lb

## 2024-02-21 DIAGNOSIS — Z0000 Encounter for general adult medical examination without abnormal findings: Secondary | ICD-10-CM | POA: Diagnosis not present

## 2024-02-21 NOTE — Patient Instructions (Addendum)
 Karina Ferguson , Thank you for taking time out of your busy schedule to complete your Annual Wellness Visit with me. I enjoyed our conversation and look forward to speaking with you again next year. I, as well as your care team,  appreciate your ongoing commitment to your health goals. Please review the following plan we discussed and let me know if I can assist you in the future. Your Game plan/ To Do List   Follow up Visits: Next Medicare AWV with our clinical staff: 02/24/25 11am, telephone.    Next Office Visit with your provider: 04/15/24 10:20am. Waddell Mon, NP  Clinician Recommendations:  Aim for 30 minutes of exercise or brisk walking, 6-8 glasses of water, and 5 servings of fruits and vegetables each day.    You will need to get the following vaccines at your local pharmacy: [facility]    This is a list of the screening recommended for you and due dates:  Health Maintenance  Topic Date Due   Zoster (Shingles) Vaccine (1 of 2) Never done   Medicare Annual Wellness Visit  11/29/2023   COVID-19 Vaccine (5 - 2024-25 season) 05/14/2024*   DTaP/Tdap/Td vaccine (2 - Td or Tdap) 08/29/2024*   Flu Shot  03/28/2024   Mammogram  06/17/2024   Colon Cancer Screening  06/15/2031   Pneumococcal Vaccine for age over 32  Completed   DEXA scan (bone density measurement)  Completed   Hepatitis C Screening  Completed   Hepatitis B Vaccine  Aged Out   HPV Vaccine  Aged Out   Meningitis B Vaccine  Aged Out  *Topic was postponed. The date shown is not the original due date.    Advanced directives: (Copy Requested) Please bring a copy of your health care power of attorney and living will to the office to be added to your chart at your convenience. You can mail to [facility] [address] [place], [place] [address] or email to [email or web address] Advance Care Planning is important because it:  [x]  Makes sure you receive the medical care that is consistent with your values,  goals, and preferences  [x]  It provides guidance to your family and loved ones and reduces their decisional burden about whether or not they are making the right decisions based on your wishes.  Follow the link provided in your after visit summary or read over the paperwork we have mailed to you to help you started getting your Advance Directives in place. If you need assistance in completing these, please reach out to us  so that we can help you!  See attachments for Exercise and Fall Prevention Tips.

## 2024-02-21 NOTE — Progress Notes (Signed)
 Subjective:   Karina Ferguson is a 68 y.o. who presents for a Medicare Wellness preventive visit.  As a reminder, Annual Wellness Visits don't include a physical exam, and some assessments may be limited, especially if this visit is performed virtually. We may recommend an in-person follow-up visit with your provider if needed.  Visit Complete: Virtual I connected with  [name] on 02/21/24 by a audio enabled telemedicine application and verified that I am speaking with the correct person using two identifiers.  Patient Location: Home  Provider Location: Office/Clinic  I discussed the limitations of evaluation and management by telemedicine. The patient expressed understanding and agreed to proceed.  Vital Signs: Because this visit was a virtual/telehealth visit, some criteria may be missing or patient reported. Any vitals not documented were not able to be obtained and vitals that have been documented are patient reported.  VideoDeclined- This patient declined Librarian, academic. Therefore the visit was completed with audio only.  Persons Participating in Visit: Patient.  AWV Questionnaire: No: Patient Medicare AWV questionnaire was not completed prior to this visit.  Cardiac Risk Factors include: advanced age (>36men, >27 women);dyslipidemia;hypertension;obesity (BMI >30kg/m2);Other (see comment), Risk factor comments: CKD, history of breast cancer     Objective:    Today's Vitals   02/21/24 1018  Weight: 238 lb (108 kg)  Height: 5' 9 (1.753 m)   Body mass index is 35.15 kg/m.     02/21/2024   11:18 AM 03/06/2023    9:50 PM 11/29/2022   11:03 AM 10/11/2022    1:13 PM 09/28/2022   11:25 AM 09/14/2022    1:28 PM 09/08/2022    1:12 PM  Advanced Directives  Does Patient Have a Medical Advance Directive? Yes No Yes Yes No No No  Type of Estate agent of [place];Living will  Healthcare Power of [place];Living will Living  will;Healthcare Power of Attorney     Does patient want to make changes to medical advance directive? No - Patient declined  No - Patient declined      Copy of Healthcare Power of Attorney in Chart? Yes - validated most recent copy scanned in chart (See row information)  No - copy requested      Would patient like information on creating a medical advance directive?  No - Patient declined   No - Patient declined No - Patient declined No - Patient declined    Current Medications (verified) Outpatient Encounter Medications as of 02/21/2024  Medication Sig   albuterol  (VENTOLIN  HFA) 108 (90 Base) MCG/ACT inhaler Inhale 1-2 puffs into the lungs every 6 (six) hours as needed.   amLODipine  (NORVASC ) 10 MG tablet Take 1 tablet (10 mg total) by mouth daily.   cholecalciferol (VITAMIN D3) 25 MCG (1000 UNIT) tablet Take 1,000 Units by mouth daily.   gabapentin  (NEURONTIN ) 100 MG capsule Take 100 mg by mouth daily as needed (pain). PRN for back pain   hydrochlorothiazide  (HYDRODIURIL ) 25 MG tablet Take 25 mg by mouth daily.   meclizine  (ANTIVERT ) 12.5 MG tablet Take 1 tablet (12.5 mg total) by mouth 3 (three) times daily as needed for dizziness.   metoprolol  succinate (TOPROL -XL) 50 MG 24 hr tablet TAKE 1 TABLET DAILY   montelukast  (SINGULAIR ) 10 MG tablet Take 1 tablet (10 mg total) by mouth daily.   Multiple Vitamin (MULTI-VITAMIN) tablet Take 1 tablet by mouth daily.   Omega-3 Fatty Acids (FISH OIL) 1000 MG CAPS Take 1,000 mg by mouth daily.  omeprazole  (PRILOSEC) 20 MG capsule Take 1 capsule (20 mg total) by mouth daily.   rosuvastatin  (CRESTOR ) 5 MG tablet Take 1 tablet (5 mg total) by mouth daily.   vitamin B-12 (CYANOCOBALAMIN ) 500 MCG tablet Take 500 mcg by mouth daily.   [DISCONTINUED] potassium chloride  (KLOR-CON ) 10 MEQ tablet Take 1 tablet (10 mEq total) by mouth daily for 5 days. (Patient not taking: Reported on 02/21/2024)   No facility-administered encounter medications on file as of  02/21/2024.    Allergies (verified) Amoxicillin, Esomeprazole magnesium, and Rabeprazole   History: Past Medical History:  Diagnosis Date   Allergic rhinitis    Allergy    Anemia    Anxiety    Arthritis    Breast cancer (HCC) 06/05/2022   Cancer (HCC) 04/2022   right breast IDC/DCIS   Chronic idiopathic constipation    GERD (gastroesophageal reflux disease)    Hyperlipidemia    Hypertension    Insomnia    Personal history of chemotherapy    Personal history of radiation therapy    Sleep apnea    wears CPAP nightly   Stage 3 chronic kidney disease (HCC)    Thyroid  disease    Vertigo    Past Surgical History:  Procedure Laterality Date   ABDOMINAL HYSTERECTOMY     BREAST BIOPSY  09/13/2022   MM RT RADIOACTIVE SEED LOC MAMMO GUIDE 09/13/2022 [facility]MO[facility]   BREAST BIOPSY  09/13/2022   MM RT RADIOACTIVE SEED EA ADD LESION LOC MAMMO GUIDE 09/13/2022 [facility]MO[facility]   BREAST LUMPECTOMY Right 09/14/2022   BREAST LUMPECTOMY Left 2004   BREAST LUMPECTOMY WITH RADIOACTIVE SEED AND SENTINEL LYMPH NODE BIOPSY Right 09/14/2022   Procedure: RIGHT BREAST LUMPECTOMY WITH RADIOACTIVE SEED;  Surgeon: Ebbie Cough, MD;  Location: [facility] OR;  Service: General;  Laterality: Right;   Left breast cancer     20 years ago   PORT-A-CATH REMOVAL N/A 09/14/2022   Procedure: REMOVAL PORT-A-CATH;  Surgeon: Ebbie Cough, MD;  Location: [facility] OR;  Service: General;  Laterality: N/A;   PORTACATH PLACEMENT Right 06/19/2022   Procedure: PORT PLACEMENT WITH ULTRASOUND GUIDANCE;  Surgeon: Ebbie Cough, MD;  Location: [facility];  Service: General;  Laterality: Right;   RADIOACTIVE SEED GUIDED AXILLARY SENTINEL LYMPH NODE Right 09/14/2022   Procedure: Right AXILLARY SENTINEL LYMPH NODE;  Surgeon: Ebbie Cough, MD;  Location: [facility] OR;  Service: General;  Laterality: Right;   RADIOACTIVE SEED GUIDED EXCISIONAL BREAST BIOPSY Right 09/14/2022   Procedure: RADIOACTIVE SEED  GUIDED EXCISIONAL BREAST BIOPSY;  Surgeon: Ebbie Cough, MD;  Location: [facility] OR;  Service: General;  Laterality: Right;   THYROIDECTOMY, PARTIAL     Family History  Problem Relation Age of Onset   Breast cancer Paternal Aunt    Lung cancer Paternal Uncle        he smoked   Lung cancer Paternal Uncle        he smoked   Colon cancer Neg Hx    Pancreatic cancer Neg Hx    Esophageal cancer Neg Hx    Stomach cancer Neg Hx    Rectal cancer Neg Hx    Social History   Socioeconomic History   Marital status: Married    Spouse name: Not on file   Number of children: Not on file   Years of education: Not on file   Highest education level: 12th grade  Occupational History   Not on file  Tobacco Use   Smoking status: Never   Smokeless  tobacco: Never  Vaping Use   Vaping status: Never Used  Substance and Sexual Activity   Alcohol use: Yes    Comment: occasional wine   Drug use: Never   Sexual activity: Yes    Birth control/protection: Surgical    Comment: hyst  Other Topics Concern   Not on file  Social History Narrative   She is married, she has 1 son and 1 daughter they are twins and 4 grandchildren.      She is retired from [employer] having done many jobs there.      No alcohol 1 caffeinated beverage a day never smoker and no tobacco.   Social Drivers of Corporate investment banker Strain: Low Risk  (02/21/2024)   Overall Financial Resource Strain (CARDIA)    Difficulty of Paying Living Expenses: Not hard at all  Food Insecurity: No Food Insecurity (02/21/2024)   Hunger Vital Sign    Worried About Running Out of Food in the Last Year: Never true    Ran Out of Food in the Last Year: Never true  Transportation Needs: No Transportation Needs (02/21/2024)   PRAPARE - Administrator, Civil Service (Medical): No    Lack of Transportation (Non-Medical): No  Physical Activity: Insufficiently Active (02/21/2024)   Exercise Vital Sign    Days of  Exercise per Week: 3 days    Minutes of Exercise per Session: 20 min  Stress: No Stress Concern Present (02/21/2024)   [employer] of Occupational Health - Occupational Stress Questionnaire    Feeling of Stress: Only a little  Social Connections: Socially Integrated (02/21/2024)   Social Connection and Isolation Panel    Frequency of Communication with Friends and Family: More than three times a week    Frequency of Social Gatherings with Friends and Family: More than three times a week    Attends Religious Services: More than 4 times per year    Active Member of [employer] or Organizations: Yes    Attends Engineer, structural: More than 4 times per year    Marital Status: Married    Tobacco Counseling Counseling given: Not Answered    Clinical Intake:  Pre-visit preparation completed: Yes     BMI - recorded: 35.15 Nutritional Status: BMI > 30  Obese Nutritional Risks: None Diabetes: No  Lab Results  Component Value Date   HGBA1C 5.9 05/15/2023     How often do you need to have someone help you when you read instructions, pamphlets, or other written materials from your doctor or pharmacy?: 1 - Never What is the last grade level you completed in school?: 12  Interpreter Needed?: No  Information entered by :: Lolita Libra, CMA   Activities of Daily Living     02/21/2024   10:18 AM  In your present state of health, do you have any difficulty performing the following activities:  Hearing? 0  Vision? 1  Comment has cataracts  Difficulty concentrating or making decisions? 0  Walking or climbing stairs? 0  Dressing or bathing? 0  Doing errands, shopping? 0  Preparing Food and eating ? N  Using the Toilet? N  In the past six months, have you accidently leaked urine? N  Do you have problems with loss of bowel control? N  Managing your Medications? N  Managing your Finances? N  Housekeeping or managing your Housekeeping? N    Patient Care Team: Almarie Waddell NOVAK, NP as PCP - General (Family Medicine)  Dewey Rush, MD as Consulting Physician (Radiation Oncology) Odean Potts, MD as Consulting Physician (Hematology and Oncology) Ebbie Cough, MD as Consulting Physician (General Surgery) [facility] (Ophthalmology)  I have updated your Care Teams any recent Medical Services you may have received from other providers in the past year.     Assessment:   This is a routine wellness examination for Zayana.  Hearing/Vision screen Hearing Screening - Comments:: Denies hearing difficulties.  Vision Screening - Comments:: Wears RX glasses -- up to date with routine eye exams (03/2023) with [facility]    Goals Addressed               This Visit's Progress     Patient Stated (pt-stated)        She wants to start silver sneakers and get regular exercise.       Depression Screen     02/21/2024   11:07 AM 11/29/2022   11:04 AM 10/25/2022    3:43 PM  PHQ 2/9 Scores  PHQ - 2 Score 0 1 1  PHQ- 9 Score 1  4    Fall Risk     02/21/2024   11:02 AM 11/28/2022    2:42 PM 10/25/2022    4:08 PM  Fall Risk   Falls in the past year? 0 0 0  Number falls in past yr: 0 0 0  Injury with Fall? 0 0 0  Risk for fall due to : Orthopedic patient;Impaired vision No Fall Risks   Risk for fall due to: Comment has cataract    Follow up Falls evaluation completed Falls evaluation completed     MEDICARE RISK AT HOME:  Medicare Risk at Home Any stairs in or around the home?: Yes If so, are there any without handrails?: No Home free of loose throw rugs in walkways, pet beds, electrical cords, etc?: Yes Adequate lighting in your home to reduce risk of falls?: Yes Life alert?: No Use of a cane, walker or w/c?: No Grab bars in the bathroom?: Yes Shower chair or bench in shower?: Yes (has shower chair but doesn't need it currently) Elevated toilet seat or a handicapped toilet?: No (has comfort height toilet)  TIMED UP AND  GO:  Was the test performed?  No,audio  Cognitive Function: 6CIT completed        02/21/2024   11:14 AM 11/29/2022   11:12 AM  6CIT Screen  What Year? 0 points 0 points  What month? 0 points 0 points  What time? 0 points 0 points  Count back from 20 0 points 0 points  Months in reverse 0 points 0 points  Repeat phrase 0 points 0 points  Total Score 0 points 0 points    Immunizations Immunization History  Administered Date(s) Administered   Fluad Trivalent(High Dose 65+) 08/30/2023   PFIZER Comirnaty(Gray Top)Covid-19 Tri-Sucrose Vaccine 07/19/2020   PFIZER(Purple Top)SARS-COV-2 Vaccination 11/22/2019, 12/13/2019, 07/19/2020   Pneumococcal Conjugate-13 09/16/2020   Pneumococcal Polysaccharide-23 10/04/2021   Tdap 01/11/2012    Screening Tests Health Maintenance  Topic Date Due   Zoster Vaccines- Shingrix (1 of 2) Never done   Medicare Annual Wellness (AWV)  11/29/2023   COVID-19 Vaccine (5 - 2024-25 season) 05/14/2024 (Originally 04/29/2023)   DTaP/Tdap/Td (2 - Td or Tdap) 08/29/2024 (Originally 01/10/2022)   INFLUENZA VACCINE  03/28/2024   MAMMOGRAM  06/17/2024   Colonoscopy  06/15/2031   Pneumococcal Vaccine: 50+ Years  Completed   DEXA SCAN  Completed   Hepatitis  C Screening  Completed   Hepatitis B Vaccines  Aged Out   HPV VACCINES  Aged Out   Meningococcal B Vaccine  Aged Out    Health Maintenance  Health Maintenance Due  Topic Date Due   Zoster Vaccines- Shingrix (1 of 2) Never done   Medicare Annual Wellness (AWV)  11/29/2023   Health Maintenance Items Addressed: Will get Shingrix at local pharmacy.  Additional Screening:  Vision Screening: Recommended annual ophthalmology exams for early detection of glaucoma and other disorders of the eye. Would you like a referral to an eye doctor? No    Dental Screening: Recommended annual dental exams for proper oral hygiene  Community Resource Referral / Chronic Care Management: CRR required this visit?  No    CCM required this visit?  No   Plan:    I have personally reviewed and noted the following in the patient's chart:   Medical and social history Use of alcohol, tobacco or illicit drugs  Current medications and supplements including opioid prescriptions. Patient is not currently taking opioid prescriptions. Functional ability and status Nutritional status Physical activity Advanced directives List of other physicians Hospitalizations, surgeries, and ER visits in previous 12 months Vitals Screenings to include cognitive, depression, and falls Referrals and appointments  In addition, I have reviewed and discussed with patient certain preventive protocols, quality metrics, and best practice recommendations. A written personalized care plan for preventive services as well as general preventive health recommendations were provided to patient.   Lolita Libra, CMA   02/21/2024   After Visit Summary: (MyChart) Due to this being a telephonic visit, the after visit summary with patients personalized plan was offered to patient via MyChart   Notes: Nothing significant to report at this time.

## 2024-02-22 ENCOUNTER — Other Ambulatory Visit: Payer: Self-pay

## 2024-03-03 ENCOUNTER — Other Ambulatory Visit: Payer: Self-pay | Admitting: Family Medicine

## 2024-03-03 DIAGNOSIS — I10 Essential (primary) hypertension: Secondary | ICD-10-CM

## 2024-04-02 ENCOUNTER — Ambulatory Visit (INDEPENDENT_AMBULATORY_CARE_PROVIDER_SITE_OTHER): Admitting: Family Medicine

## 2024-04-02 ENCOUNTER — Encounter: Payer: Self-pay | Admitting: Family Medicine

## 2024-04-02 ENCOUNTER — Ambulatory Visit (HOSPITAL_BASED_OUTPATIENT_CLINIC_OR_DEPARTMENT_OTHER): Admission: RE | Admit: 2024-04-02 | Source: Ambulatory Visit

## 2024-04-02 ENCOUNTER — Ambulatory Visit: Payer: Self-pay | Admitting: *Deleted

## 2024-04-02 VITALS — BP 132/74 | HR 94 | Temp 98.0°F | Resp 16 | Ht 69.0 in | Wt 239.4 lb

## 2024-04-02 DIAGNOSIS — M7061 Trochanteric bursitis, right hip: Secondary | ICD-10-CM | POA: Diagnosis not present

## 2024-04-02 DIAGNOSIS — M79605 Pain in left leg: Secondary | ICD-10-CM

## 2024-04-02 MED ORDER — METHYLPREDNISOLONE ACETATE 80 MG/ML IJ SUSP
80.0000 mg | Freq: Once | INTRAMUSCULAR | Status: AC
  Administered 2024-04-02: 80 mg via INTRAMUSCULAR

## 2024-04-02 NOTE — Addendum Note (Signed)
 Addended by: Hansika Leaming M on: 04/02/2024 01:38 PM   Modules accepted: Orders

## 2024-04-02 NOTE — Patient Instructions (Addendum)
 Your US  appointment is at 6 PM. Go through the ER doors and let them know you are here for an ultrasound that is already scheduled. If you have chest pain or shortness of breath, please seek immediate care.   Heat (pad or rice pillow in microwave) over affected area, 10-15 minutes twice daily.   Ice/cold pack over area for 10-15 min twice daily.  OK to take Tylenol  1000 mg (2 extra strength tabs) or 975 mg (3 regular strength tabs) every 6 hours as needed.  Stay hydrated.   Consider Vit K2 100-200 mcg daily. This is OTC.   Don't do the stretches until you hear about your results please.   Let us  know if you need anything.  Stretching and range of motion exercises These exercises warm up your muscles and joints and improve the movement and flexibility of your lower leg. These exercises also help to relieve pain and stiffness.  Exercise A: Gastrocnemius stretch Sit with your left / right leg extended. Loop a belt or towel around the ball of your left / right foot. The ball of your foot is on the walking surface, right under your toes. Hold both ends of the belt or towel. Keep your left / right ankle and foot relaxed and keep your knee straight while you use the belt or towel to pull your foot and ankle toward you. Stop at the first point of resistance. Hold this position for 30 seconds. Repeat 2 times. Complete this exercise 3 times per week.  Exercise B: Ankle alphabet Sit with your left / right leg supported at the lower leg. Do not rest your foot on anything. Make sure your foot has room to move freely. Think of your left / right foot as a paintbrush, and move your foot to trace each letter of the alphabet in the air. Keep your hip and knee still while you trace. Trace every letter from A to Z. Repeat 2 times. Complete this exercise 3 times per week.  Strengthening exercises These exercises build strength and endurance in your lower leg. Endurance is the ability to use your  muscles for a long time, even after they get tired.  Exercise C: Plantar flexors with band Sit with your left / right leg extended. Loop a rubber exercise band or tube around the ball of your left / right foot. The ball of your foot is on the walking surface, right under your toes. While holding both ends of the band or tube, slowly point your toes downward, pushing them away from you. Hold this position for 3 seconds. Slowly return your foot to the starting position and repeat for a total of 10 repetitions. Repeat 2 times. Complete this exercise 3 times per week.  Exercise D: Plantar flexors, standing Stand with your feet shoulder-width apart. Place your hands on a wall or table to steady yourself as needed, but try not to use it very much for support. Rise up on your toes. If this exercise is too easy, try these options: Shift your weight toward your left / right leg until you feel challenged. If told by your health care provider, stand on your left / right foot only. Hold this position for 3 seconds. Repeat for a total of 10 repetitions. Repeat 2 times. Complete this exercise 3 times per week.  Exercise E: Plantar flexors, eccentric Stand on the balls of your feet on the edge of a step. The ball of your foot is on the walking surface, right  under your toes. Place your hands on a wall or railing for balance as needed, but try not to lean on it for support. Rise up on your toes, using both legs to help. Slowly shift all of your weight to your left / right foot and lift your other foot off the step. Slowly lower your left / right heel so it drops below the level of the step. You will feel a slight stretch in your left / right calf. Put your other foot back onto the step. Repeat 2 times. Complete this exercise 3 times per week. This information is not intended to replace advice given to you by your health care provider. Make sure you discuss any questions you have with your health care  provider.

## 2024-04-02 NOTE — Telephone Encounter (Signed)
 FYI Only or Action Required?: Action required by provider: request for appointment.  Patient was last seen in primary care on 02/13/2024 by Almarie Waddell NOVAK, NP.  Called Nurse Triage reporting Pain.  Symptoms began a week ago.  Interventions attempted: Rest, hydration, or home remedies.  Symptoms are: gradually worsening.  Triage Disposition: See HCP Within 4 Hours (Or PCP Triage)  Patient/caregiver understands and will follow disposition?: Yes             Copied from CRM #[id number]. Topic: Clinical - Red Word Triage >> Apr 02, 2024  8:16 AM Henretta I wrote: Red Word that prompted transfer to Nurse Triage: pain in legs has been going on for a week Reason for Disposition  History of prior blood clot in leg or lungs (i.e., deep vein thrombosis, pulmonary embolism)  Answer Assessment - Initial Assessment Questions Appt scheduled today with other provider none available with PCP today . Patient denies swelling in legs, no warmth to touch. Red , bruise like area to thigh.      1. ONSET: When did the pain start?      Last week  2. LOCATION: Where is the pain located?      Left leg pain and red bruise left thigh  3. PAIN: How bad is the pain?    (Scale 1-10; or mild, moderate, severe)     Soreness can walk 4. WORK OR EXERCISE: Has there been any recent work or exercise that involved this part of the body?      na 5. CAUSE: What do you think is causing the leg pain?     Not sure  6. OTHER SYMPTOMS: Do you have any other symptoms? (e.g., chest pain, back pain, breathing difficulty, swelling, rash, fever, numbness, weakness)     Muscle cramps at night left leg pain moderate hx DVT 7. PREGNANCY: Is there any chance you are pregnant? When was your last menstrual period?     na  Protocols used: Leg Pain-A-AH

## 2024-04-02 NOTE — Progress Notes (Signed)
 Musculoskeletal Exam  Patient: Karina Ferguson DOB: 30-Oct-1955  DOS: 04/02/2024  SUBJECTIVE:  Chief Complaint:   Chief Complaint  Patient presents with   Leg Pain    Leg Pain and Hip Pain    Karina Ferguson is a 68 y.o.  female for evaluation and treatment of L calf/thigh pain.   Onset:  3 days ago. No inj or change in activity.  Location: Left calf Character:  aching  Progression of issue:  is unchanged Associated symptoms: slight swelling No bruising, redness, swelling, recent bedrest, surgeries.  Treatment: to date has been rest.   Neurovascular symptoms: no  Past Medical History:  Diagnosis Date   Allergic rhinitis    Allergy    Anemia    Anxiety    Arthritis    Breast cancer (HCC) 06/05/2022   Cancer (HCC) 04/2022   right breast IDC/DCIS   Chronic idiopathic constipation    GERD (gastroesophageal reflux disease)    Hyperlipidemia    Hypertension    Insomnia    Personal history of chemotherapy    Personal history of radiation therapy    Sleep apnea    wears CPAP nightly   Stage 3 chronic kidney disease (HCC)    Thyroid  disease    Vertigo     Objective: VITAL SIGNS: BP 132/74 (BP Location: Left Arm, Patient Position: Sitting)   Pulse 94   Temp 98 F (36.7 C) (Oral)   Resp 16   Ht 5' 9 (1.753 m)   Wt 239 lb 6.4 oz (108.6 kg)   SpO2 97%   BMI 35.35 kg/m  Constitutional: Well formed, well developed. No acute distress. Heart: RRR, slight edema of the left lower extremity compared to the right Thorax & Lungs: CTAB.  No accessory muscle use Musculoskeletal: Left calf.   Tenderness to palpation: yes, both medially and laterally at the proximal calf There is also TTP over the medial mid thigh on the left Deformity: no Ecchymosis: no Tests positive: None Tests negative: Homans She also TTP over the right greater trochanteric region without TTP with resisted hip adduction.  No TTP in the lower lumbar region. Neurologic: Normal sensory function.  Gait is  normal Psychiatric: Normal mood. Age appropriate judgment and insight. Alert & oriented x 3.    Procedure note: Greater trochanteric bursa injection Verbal consent obtained. The area of interest was palpated and demarcated with an otoscope speculum. It was cleaned with an alcohol swab. Freeze spray was used. A 27 g needle was inserted at a perpendicular angle through the area of interested. The plunger was withdrawn to ensure our placement was not in a vessel. 2 mL of 1% lidocaine  without epi and 40 mg of Depomedrol were injected. A bandaid was placed. The patient tolerated the procedure well.  There were no complications noted.   Assessment:  Left leg pain - Plan: US  Venous Img Lower Unilateral Left, CANCELED: US  Venous Img Lower Unilateral Left  Greater trochanteric bursitis of right hip - Plan: PR ARTHROCENTESIS ASPIR&/INJ MAJOR JT/BURSA W/O US   Plan: Ck for a clot.  See is scheduled for later today at 6 PM.  Calf stretches/exercises which she will start after receiving her results, heat, ice, Tylenol . Go to ER if having SOB/CP.  Injection today. Ice, Tylenol .  Could consider PT for IT band syndrome involvement. F/u as originally scheduled w reg PCP. The patient voiced understanding and agreement to the plan.  I spent 30 minutes with the patient discussing the above plans in addition to  reviewing her chart on the same day of the visit.   Mabel [place], DO 04/02/24  12:21 PM

## 2024-04-03 ENCOUNTER — Ambulatory Visit: Payer: Self-pay | Admitting: Family Medicine

## 2024-04-03 ENCOUNTER — Ambulatory Visit (HOSPITAL_BASED_OUTPATIENT_CLINIC_OR_DEPARTMENT_OTHER)
Admission: RE | Admit: 2024-04-03 | Discharge: 2024-04-03 | Disposition: A | Discharge status: Home / Self Care | Source: Ambulatory Visit | Attending: Family Medicine | Admitting: Family Medicine

## 2024-04-03 DIAGNOSIS — M79605 Pain in left leg: Secondary | ICD-10-CM | POA: Diagnosis present

## 2024-04-04 ENCOUNTER — Telehealth: Payer: Self-pay | Admitting: Hematology and Oncology

## 2024-04-04 NOTE — Telephone Encounter (Signed)
 Rescheule appointment per provider pal.  Called left VM with changes made to the upcoming appointment.

## 2024-04-15 ENCOUNTER — Encounter: Admitting: Family Medicine

## 2024-04-22 NOTE — Telephone Encounter (Signed)
 Received CMN from [facility]. Placed on Katie's desk in B Pod for signature. Will fax back to [facility] one signed.

## 2024-04-25 ENCOUNTER — Telehealth: Payer: Self-pay

## 2024-04-25 NOTE — Telephone Encounter (Signed)
 Faxed signed CMN to [facility] at [phone]. Received confirmation, NFN.

## 2024-04-29 ENCOUNTER — Ambulatory Visit (HOSPITAL_BASED_OUTPATIENT_CLINIC_OR_DEPARTMENT_OTHER): Admission: RE | Admit: 2024-04-29 | Source: Ambulatory Visit

## 2024-04-29 ENCOUNTER — Encounter: Payer: Self-pay | Admitting: Family Medicine

## 2024-04-29 ENCOUNTER — Ambulatory Visit (HOSPITAL_BASED_OUTPATIENT_CLINIC_OR_DEPARTMENT_OTHER)
Admission: RE | Admit: 2024-04-29 | Discharge: 2024-04-29 | Disposition: A | Discharge status: Home / Self Care | Source: Ambulatory Visit | Attending: Family Medicine | Admitting: Family Medicine

## 2024-04-29 ENCOUNTER — Ambulatory Visit (INDEPENDENT_AMBULATORY_CARE_PROVIDER_SITE_OTHER): Admitting: Family Medicine

## 2024-04-29 VITALS — BP 134/69 | HR 76 | Ht 69.0 in | Wt 238.0 lb

## 2024-04-29 DIAGNOSIS — I10 Essential (primary) hypertension: Secondary | ICD-10-CM

## 2024-04-29 DIAGNOSIS — Z0000 Encounter for general adult medical examination without abnormal findings: Secondary | ICD-10-CM

## 2024-04-29 DIAGNOSIS — M25552 Pain in left hip: Secondary | ICD-10-CM | POA: Diagnosis not present

## 2024-04-29 DIAGNOSIS — G8929 Other chronic pain: Secondary | ICD-10-CM

## 2024-04-29 DIAGNOSIS — Z23 Encounter for immunization: Secondary | ICD-10-CM | POA: Diagnosis not present

## 2024-04-29 DIAGNOSIS — M25551 Pain in right hip: Secondary | ICD-10-CM | POA: Insufficient documentation

## 2024-04-29 DIAGNOSIS — Z6835 Body mass index (BMI) 35.0-35.9, adult: Secondary | ICD-10-CM | POA: Diagnosis not present

## 2024-04-29 DIAGNOSIS — Z136 Encounter for screening for cardiovascular disorders: Secondary | ICD-10-CM | POA: Diagnosis not present

## 2024-04-29 LAB — CBC WITH DIFFERENTIAL/PLATELET
Basophils Absolute: 0 K/uL (ref 0.0–0.1)
Basophils Relative: 0.4 % (ref 0.0–3.0)
Eosinophils Absolute: 0.1 K/uL (ref 0.0–0.7)
Eosinophils Relative: 2 % (ref 0.0–5.0)
HCT: 39.2 % (ref 36.0–46.0)
Hemoglobin: 12.9 g/dL (ref 12.0–15.0)
Lymphocytes Relative: 22.1 % (ref 12.0–46.0)
Lymphs Abs: 1.4 K/uL (ref 0.7–4.0)
MCHC: 33 g/dL (ref 30.0–36.0)
MCV: 90.9 fl (ref 78.0–100.0)
Monocytes Absolute: 0.6 K/uL (ref 0.1–1.0)
Monocytes Relative: 8.8 % (ref 3.0–12.0)
Neutro Abs: 4.3 K/uL (ref 1.4–7.7)
Neutrophils Relative %: 66.7 % (ref 43.0–77.0)
Platelets: 210 K/uL (ref 150.0–400.0)
RBC: 4.31 Mil/uL (ref 3.87–5.11)
RDW: 13.9 % (ref 11.5–15.5)
WBC: 6.4 K/uL (ref 4.0–10.5)

## 2024-04-29 LAB — COMPREHENSIVE METABOLIC PANEL WITH GFR
ALT: 16 U/L (ref 0–35)
AST: 18 U/L (ref 0–37)
Albumin: 4.3 g/dL (ref 3.5–5.2)
Alkaline Phosphatase: 55 U/L (ref 39–117)
BUN: 13 mg/dL (ref 6–23)
CO2: 29 meq/L (ref 19–32)
Calcium: 9.8 mg/dL (ref 8.4–10.5)
Chloride: 101 meq/L (ref 96–112)
Creatinine, Ser: 1.07 mg/dL (ref 0.40–1.20)
GFR: 52.73 mL/min — ABNORMAL LOW (ref >60.00)
Glucose, Bld: 100 mg/dL — ABNORMAL HIGH (ref 70–99)
Potassium: 4.1 meq/L (ref 3.5–5.1)
Sodium: 139 meq/L (ref 135–145)
Total Bilirubin: 0.5 mg/dL (ref 0.2–1.2)
Total Protein: 7.1 g/dL (ref 6.0–8.3)

## 2024-04-29 LAB — LIPID PANEL
Cholesterol: 222 mg/dL — ABNORMAL HIGH (ref 0–200)
HDL: 70.5 mg/dL (ref >39.00)
LDL Cholesterol: 137 mg/dL — ABNORMAL HIGH (ref 0–99)
NonHDL: 151.52
Total CHOL/HDL Ratio: 3
Triglycerides: 71 mg/dL (ref 0.0–149.0)
VLDL: 14.2 mg/dL (ref 0.0–40.0)

## 2024-04-29 LAB — TSH: TSH: 1.37 u[IU]/mL (ref 0.35–5.50)

## 2024-04-29 MED ORDER — AMLODIPINE BESYLATE 10 MG PO TABS: 10.0000 mg | ORAL_TABLET | Freq: Every day | ORAL | 1 refills | Status: AC

## 2024-04-29 NOTE — Assessment & Plan Note (Signed)
 Short improvement with injection last month.  Xray today. Consider repeat injection at 3 month mark. Offered PT, declined. She prefers to try home exercises first.  Supportive measures discussed.  Consider ortho referral.

## 2024-04-29 NOTE — Patient Instructions (Signed)
 Astelin  - allergy nose spray Flonase or Nasacort - steroid nose spray  Try switching Zyrtec or Allegra or Xyzal

## 2024-04-29 NOTE — Progress Notes (Signed)
 Complete physical exam  Patient: Karina Ferguson   DOB: 04-16-1956   68 y.o. Female  MRN: [id number]  Subjective:    Chief Complaint  Patient presents with   Annual Exam    Karina Ferguson is a 68 y.o. female who presents today for a complete physical exam. She reports consuming a general diet. Home exercise routine includes walking daily. She generally feels well. She reports sleeping well. She does have additional problems to discuss today.   - Both of her hips have been bothering her more lately. Last month she got right hip injection which only lasted for about a week. No xrays on file. She has been trying home exercises and regular walking.   Currently lives with: spouse Acute concerns or interim problems since last visit: ongoing hip pain  Vision concerns: no Dental concerns: no   ETOH use: rare wine Nicotine use: no Recreational drugs/illegal substances: no        Most recent fall risk assessment:    04/29/2024   10:49 AM  Fall Risk   Falls in the past year? 0  Number falls in past yr: 0  Injury with Fall? 0  Risk for fall due to : No Fall Risks  Follow up Falls evaluation completed     Most recent depression screenings:    04/29/2024   10:49 AM 02/21/2024   11:07 AM  PHQ 2/9 Scores  PHQ - 2 Score 0 0  PHQ- 9 Score 0 1            Patient Care Team: Almarie Waddell NOVAK, NP as PCP - General (Family Medicine) Dewey Rush, MD as Consulting Physician (Radiation Oncology) Odean Potts, MD as Consulting Physician (Hematology and Oncology) Ebbie Cough, MD as Consulting Physician (General Surgery) [place], [facility] (Ophthalmology)   Outpatient Medications Prior to Visit  Medication Sig   albuterol  (VENTOLIN  HFA) 108 (90 Base) MCG/ACT inhaler Inhale 1-2 puffs into the lungs every 6 (six) hours as needed.   cholecalciferol (VITAMIN D3) 25 MCG (1000 UNIT) tablet Take 1,000 Units by mouth daily.   gabapentin  (NEURONTIN ) 100 MG capsule Take 100  mg by mouth daily as needed (pain). PRN for back pain   hydrochlorothiazide  (HYDRODIURIL ) 25 MG tablet Take 25 mg by mouth daily.   meclizine  (ANTIVERT ) 12.5 MG tablet Take 1 tablet (12.5 mg total) by mouth 3 (three) times daily as needed for dizziness.   metoprolol  succinate (TOPROL -XL) 50 MG 24 hr tablet TAKE 1 TABLET DAILY   montelukast  (SINGULAIR ) 10 MG tablet Take 1 tablet (10 mg total) by mouth daily.   Multiple Vitamin (MULTI-VITAMIN) tablet Take 1 tablet by mouth daily.   Omega-3 Fatty Acids (FISH OIL) 1000 MG CAPS Take 1,000 mg by mouth daily.   omeprazole  (PRILOSEC) 20 MG capsule Take 1 capsule (20 mg total) by mouth daily.   rosuvastatin  (CRESTOR ) 5 MG tablet Take 1 tablet (5 mg total) by mouth daily.   vitamin B-12 (CYANOCOBALAMIN ) 500 MCG tablet Take 500 mcg by mouth daily.   [DISCONTINUED] amLODipine  (NORVASC ) 10 MG tablet Take 1 tablet (10 mg total) by mouth daily.   No facility-administered medications prior to visit.    ROS All review of systems negative except what is listed in the HPI        Objective:     BP 134/69   Pulse 76   Ht 5' 9 (1.753 m)   Wt 238 lb (108 kg)   SpO2 100%   BMI 35.15  kg/m    Physical Exam Vitals reviewed.  Constitutional:      General: She is not in acute distress.    Appearance: Normal appearance. She is obese. She is not ill-appearing.  HENT:     Head: Normocephalic and atraumatic.     Right Ear: Tympanic membrane normal.     Left Ear: Tympanic membrane normal.     Nose: Nose normal.     Mouth/Throat:     Mouth: Mucous membranes are moist.     Pharynx: Oropharynx is clear.  Eyes:     Extraocular Movements: Extraocular movements intact.     Conjunctiva/sclera: Conjunctivae normal.     Pupils: Pupils are equal, round, and reactive to light.  Cardiovascular:     Rate and Rhythm: Normal rate and regular rhythm.     Pulses: Normal pulses.     Heart sounds: Normal heart sounds.  Pulmonary:     Effort: Pulmonary effort is  normal.     Breath sounds: Normal breath sounds.  Abdominal:     General: Abdomen is flat. Bowel sounds are normal. There is no distension.     Palpations: Abdomen is soft. There is no mass.     Tenderness: There is no abdominal tenderness. There is no right CVA tenderness, left CVA tenderness, guarding or rebound.  Genitourinary:    Comments: Deferred exam Musculoskeletal:        General: Normal range of motion.     Cervical back: Normal range of motion and neck supple. No tenderness.     Right lower leg: No edema.     Left lower leg: No edema.  Lymphadenopathy:     Cervical: No cervical adenopathy.  Skin:    General: Skin is warm and dry.     Capillary Refill: Capillary refill takes less than 2 seconds.  Neurological:     General: No focal deficit present.     Mental Status: She is alert and oriented to person, place, and time. Mental status is at baseline.  Psychiatric:        Mood and Affect: Mood normal.        Behavior: Behavior normal.        Thought Content: Thought content normal.        Judgment: Judgment normal.          No results found for any visits on 04/29/24.     Assessment & Plan:    Routine Health Maintenance and Physical Exam Discussed health promotion and safety including diet and exercise recommendations, dental health, and injury prevention. Tobacco cessation if applicable. Seat belts, sunscreen, smoke detectors, etc.    Immunization History  Administered Date(s) Administered   Fluad Trivalent(High Dose 65+) 08/30/2023   INFLUENZA, HIGH DOSE SEASONAL PF 04/29/2024   PFIZER Comirnaty(Gray Top)Covid-19 Tri-Sucrose Vaccine 07/19/2020   PFIZER(Purple Top)SARS-COV-2 Vaccination 11/22/2019, 12/13/2019, 07/19/2020   Pneumococcal Conjugate-13 09/16/2020   Pneumococcal Polysaccharide-23 10/04/2021   Tdap 01/11/2012    Health Maintenance  Topic Date Due   COVID-19 Vaccine (5 - 2025-26 season) 05/15/2024 (Originally 04/28/2024)   Zoster Vaccines-  Shingrix (1 of 2) 07/29/2024 (Originally 01/29/1973)   DTaP/Tdap/Td (2 - Td or Tdap) 08/29/2024 (Originally 01/10/2022)   MAMMOGRAM  06/17/2024   Medicare Annual Wellness (AWV)  02/20/2025   Colonoscopy  06/15/2031   Pneumococcal Vaccine: 50+ Years  Completed   INFLUENZA VACCINE  Completed   DEXA SCAN  Completed   Hepatitis C Screening  Completed   HPV VACCINES  Aged Out  Meningococcal B Vaccine  Aged Out        Problem List Items Addressed This Visit       Active Problems   Essential hypertension   Relevant Medications   amLODipine  (NORVASC ) 10 MG tablet   Other Relevant Orders   Comprehensive metabolic panel with GFR   Chronic right hip pain   Short improvement with injection last month.  Xray today. Consider repeat injection at 3 month mark. Offered PT, declined. She prefers to try home exercises first.  Supportive measures discussed.  Consider ortho referral.       Relevant Orders   DG HIP UNILAT W OR W/O PELVIS 2-3 VIEWS RIGHT   Other Visit Diagnoses       Annual physical exam    -  Primary   Relevant Orders   CBC with Differential/Platelet   Comprehensive metabolic panel with GFR   Lipid panel   TSH     Chronic left hip pain       Relevant Orders   DG HIP UNILAT W OR W/O PELVIS 2-3 VIEWS LEFT     Encounter for immunization       Relevant Orders   Flu vaccine HIGH DOSE PF(Fluzone Trivalent) (Completed)          PATIENT COUNSELING:    Recommend that most people either abstain from alcohol or drink within safe limits (<=14/week and <=4 drinks/occasion for males, <=7/weeks and <= 3 drinks/occasion for females) and that the risk for alcohol disorders and other health effects rises proportionally with the number of drinks per week and how often a drinker exceeds daily limits.   Diet: Recommend to adjust caloric intake to maintain or achieve ideal body weight, to reduce intake of dietary saturated fat and total fat, to limit sodium intake by avoiding high  sodium foods and not adding table salt, and to maintain adequate dietary potassium and calcium  preferably from fresh fruits, vegetables, and low-fat dairy products.   Emphasized the importance of regular exercise.  Injury prevention: Recommend seatbelts, safety helmets, smoke detector, etc..   Dental health: Recommend regular tooth brushing, flossing, and dental visits.       Return in about 6 months (around 10/27/2024) for chronic disease management.     Waddell KATHEE Mon, NP

## 2024-04-30 ENCOUNTER — Ambulatory Visit: Payer: Self-pay | Admitting: Family Medicine

## 2024-04-30 DIAGNOSIS — E785 Hyperlipidemia, unspecified: Secondary | ICD-10-CM

## 2024-04-30 MED ORDER — ROSUVASTATIN CALCIUM 10 MG PO TABS: 10.0000 mg | ORAL_TABLET | Freq: Every day | ORAL | 1 refills | Status: DC

## 2024-05-01 NOTE — Telephone Encounter (Unsigned)
 Copied from CRM #[id number]. Topic: Clinical - Lab/Test Results >> May 01, 2024  8:19 AM Burnard DEL wrote: Reason for CRM: Patient is requesting a call to discuss  rosuvastatin  (CRESTOR ) 10 MG tablet that was previously prescribed to her.She stated that she did research and she seen that it could worsen your kidneys,and she already hs kidney issues. She would like a call to discuss an alternative such as Atorvastatin .

## 2024-05-01 NOTE — Telephone Encounter (Signed)
 Her kidney levels are stable overall so either would be considered safe for her, but atorvastatin  has a little less renal excretion so it's fine to switch to that if she would prefer. Try Atorvastatin  10 mg for now. Thanks.

## 2024-05-02 MED ORDER — ATORVASTATIN CALCIUM 10 MG PO TABS
10.0000 mg | ORAL_TABLET | Freq: Every day | ORAL | 1 refills | Status: AC
Start: 2024-05-02 — End: ?

## 2024-05-02 NOTE — Telephone Encounter (Signed)
 Chart reviewed, rx for atorvastatin  10mg  sent.

## 2024-05-02 NOTE — Telephone Encounter (Signed)
 Copied from CRM [id number]. Topic: Clinical - Medication Question >> May 02, 2024  3:40 PM Karina Ferguson wrote: Reason for CRM: Pt states she was told that the atorvastatin  was supposed to be called in for her yesterday  but I do not see that here and she did not see it when she called the pharmacy. She would like this sent in before the weekend, please.  [id number] - [place], [place] - [address] [place] [address] Phone: [phone] Fax: [phone] Hours: Not open 24 hours  Please call patient back when this is done so she knows it is sent over for [phone]

## 2024-05-13 ENCOUNTER — Other Ambulatory Visit: Payer: Self-pay | Admitting: Adult Health

## 2024-05-13 DIAGNOSIS — Z9889 Other specified postprocedural states: Secondary | ICD-10-CM

## 2024-06-02 ENCOUNTER — Other Ambulatory Visit: Payer: Self-pay | Admitting: Family Medicine

## 2024-06-02 DIAGNOSIS — I10 Essential (primary) hypertension: Secondary | ICD-10-CM

## 2024-06-09 ENCOUNTER — Ambulatory Visit

## 2024-06-18 ENCOUNTER — Encounter: Payer: Self-pay | Admitting: Hematology and Oncology

## 2024-06-18 ENCOUNTER — Ambulatory Visit
Admission: RE | Admit: 2024-06-18 | Discharge: 2024-06-18 | Disposition: A | Discharge status: Home / Self Care | Source: Ambulatory Visit | Attending: Adult Health | Admitting: Adult Health

## 2024-06-18 DIAGNOSIS — Z9889 Other specified postprocedural states: Secondary | ICD-10-CM

## 2024-06-24 ENCOUNTER — Inpatient Hospital Stay: Admitting: Hematology and Oncology

## 2024-06-25 ENCOUNTER — Telehealth: Payer: Self-pay

## 2024-06-25 ENCOUNTER — Ambulatory Visit: Payer: Medicare Other | Admitting: Hematology and Oncology

## 2024-06-25 NOTE — Telephone Encounter (Signed)
 Called pt per MD to advise Guardant testing was negative/not detected. Lvm for patient to return call if she has any questions or concerns.

## 2024-06-26 ENCOUNTER — Encounter: Payer: Self-pay | Admitting: Hematology and Oncology

## 2024-07-03 ENCOUNTER — Inpatient Hospital Stay: Attending: Hematology and Oncology | Admitting: Hematology and Oncology

## 2024-07-03 VITALS — BP 156/84 | HR 90 | Temp 97.1°F | Resp 18 | Ht 69.0 in | Wt 237.1 lb

## 2024-07-03 DIAGNOSIS — Z853 Personal history of malignant neoplasm of breast: Secondary | ICD-10-CM | POA: Insufficient documentation

## 2024-07-03 DIAGNOSIS — C50411 Malignant neoplasm of upper-outer quadrant of right female breast: Secondary | ICD-10-CM | POA: Diagnosis not present

## 2024-07-03 DIAGNOSIS — Z08 Encounter for follow-up examination after completed treatment for malignant neoplasm: Secondary | ICD-10-CM | POA: Insufficient documentation

## 2024-07-03 DIAGNOSIS — Z171 Estrogen receptor negative status [ER-]: Secondary | ICD-10-CM

## 2024-07-03 NOTE — Assessment & Plan Note (Signed)
 Breast MRI performed because of high breast density 05/10/2022: Right breast linear enhancement 1.9 cm at 12 o'clock position, indeterminate mass 8 mm in the lateral periareolar region right breast. 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   Treatment plan: 1.  06/29/2022 neoadjuvant with dose dense Adriamycin  and Cytoxan  x4 (Taxol not given because of neuropathy) 2. 09/15/2022: Right lumpectomy: Grade 3 IDC 2.5 mm DCIS grade 3, margins negative, 0/1 lymph nodes negative 3.  11/22/2022: Completed adjuvant radiation ------------------------------------------------------------------------------------------------------------------------------------------------------- Current treatment: Surveillance   Breast cancer surveillance:   Breast exam 07/03/2024: Benign Mammogram 06/18/2024 benign breast density category B Guardant reveal 06/16/2024: 0%   Return to clinic in 1 year for follow-up

## 2024-07-03 NOTE — Progress Notes (Signed)
 Patient Care Team: Almarie Waddell NOVAK, NP as PCP - General (Family Medicine) Dewey Rush, MD as Consulting Physician (Radiation Oncology) Odean Potts, MD as Consulting Physician (Hematology and Oncology) Ebbie Cough, MD as Consulting Physician (General Surgery) [place], [facility] (Ophthalmology)  DIAGNOSIS:  Encounter Diagnosis  Name Primary?   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 Report date is 06/28/2022.   The Common Hereditary Cancers + RNA Panel offered by [facility] includes sequencing, deletion/duplication, and RNA testing of the following 47 genes: APC, ATM, AXIN2, BARD1, BMPR1A, BRCA1, BRCA2, BRIP1, CDH1, CDK4*, CDKN2A (p14ARF)*, CDKN2A (p16INK4a)*, CHEK2, CTNNA1, DICER1, EPCAM (Deletion/duplication testing only), GREM1 (promoter region deletion/duplication testing only), KIT, MEN1, MLH1, MSH2, MSH3, MSH6, MUTYH, NBN, NF1,  NHTL1, PALB2, PDGFRA*, PMS2, POLD1, POLE, PTEN, RAD50, RAD51C, RAD51D, SDHB, SDHC, SDHD, SMAD4, SMARCA4. STK11, TP53, TSC1, TSC2, and VHL.  The following genes were evaluated for sequence changes only: SDHA and HOXB13 c.251G>A variant only.  RNA analysis is not performed for the * genes.     09/15/2022 Surgery   Right lumpectomy: Grade 3 IDC 2.5 mm DCIS grade 3, margins negative, 0/1 lymph nodes negative   10/26/2022 - 11/22/2022 Radiation Therapy   Right Breast: 42.56 Gy in 16 treatments "Boost": 8 Gy in 4 treatments      CHIEF COMPLIANT: Surveillance of breast cancer  HISTORY OF PRESENT ILLNESS:   History of Present Illness Karina Ferguson is a 68 year old female with a history of breast cancer who presents for follow-up regarding her arthritis and post-treatment symptoms.  She is here for follow-up of breast cancer history  She completed treatment for breast cancer two years ago. She experiences intermittent tingling in her feet and fingers, not related to cold weather, and occasional night cramps in her calf. A recent mammogram and blood work returned normal results. She notes occasional swelling in her breast, which varies in appearance.     ALLERGIES:  is allergic to amoxicillin, esomeprazole magnesium, and rabeprazole.  MEDICATIONS:  Current Outpatient Medications  Medication Sig Dispense Refill   albuterol  (VENTOLIN  HFA) 108 (90 Base) MCG/ACT inhaler Inhale 1-2 puffs into the lungs every 6 (six) hours as needed. 18 g 3   amLODipine  (NORVASC ) 10 MG tablet Take 1 tablet (10 mg total) by mouth daily. 90 tablet 1   atorvastatin  (LIPITOR) 10 MG tablet Take 1 tablet (10 mg total) by mouth at bedtime. 90 tablet 1   cholecalciferol (VITAMIN D3) 25 MCG (  1000 UNIT) tablet Take 1,000 Units by mouth daily.     gabapentin  (NEURONTIN ) 100 MG capsule Take 100 mg by mouth daily as needed (pain). PRN for back pain     hydrochlorothiazide  (HYDRODIURIL ) 25 MG tablet Take 25 mg by mouth daily.      meclizine  (ANTIVERT ) 12.5 MG tablet Take 1 tablet (12.5 mg total) by mouth 3 (three) times daily as needed for dizziness. 30 tablet 0   metoprolol  succinate (TOPROL -XL) 50 MG 24 hr tablet TAKE 1 TABLET DAILY 90 tablet 1   montelukast  (SINGULAIR ) 10 MG tablet Take 1 tablet (10 mg total) by mouth daily. 90 tablet 1   Multiple Vitamin (MULTI-VITAMIN) tablet Take 1 tablet by mouth daily.     Omega-3 Fatty Acids (FISH OIL) 1000 MG CAPS Take 1,000 mg by mouth daily.     omeprazole  (PRILOSEC) 20 MG capsule Take 1 capsule (20 mg total) by mouth daily. 90 capsule 1   vitamin B-12 (CYANOCOBALAMIN ) 500 MCG tablet Take 500 mcg by mouth daily.     No current facility-administered medications for this visit.    PHYSICAL EXAMINATION: ECOG PERFORMANCE STATUS: 1 - Symptomatic but completely ambulatory  Vitals:   07/03/24 1435  BP: (!) 156/84  Pulse: 90  Resp: 18  Temp: (!) 97.1 F (36.2 C)  SpO2: 100%   Filed Weights   07/03/24 1435  Weight: 237 lb 1.6 oz (107.5 kg)    Physical Exam VITALS: BP- 156/84 BREAST: Breast with mild swelling, no lymphadenopathy.  (exam performed in the presence of a chaperone)  LABORATORY DATA:  I have reviewed the data as listed    Latest Ref Rng & Units 04/29/2024   10:51 AM 08/25/2023   12:15 PM 05/15/2023   11:10 AM  CMP  Glucose 70 - 99 mg/dL 899  872  895   BUN 6 - 23 mg/dL 13  13  12    Creatinine 0.40 - 1.20 mg/dL 8.92  9.05  9.03   Sodium 135 - 145 mEq/L 139  137  137   Potassium 3.5 - 5.1 mEq/L 4.1  3.0  4.0   Chloride 96 - 112 mEq/L 101  105  101   CO2 19 - 32 mEq/L 29  23  28    Calcium  8.4 - 10.5 mg/dL 9.8  9.7  9.9   Total Protein 6.0 - 8.3 g/dL 7.1  7.3  6.6   Total Bilirubin 0.2 - 1.2 mg/dL 0.5  0.6  0.5   Alkaline Phos 39 - 117 U/L 55  51  54   AST 0 - 37 U/L 18  23  18    ALT 0 - 35 U/L 16  21  16      Lab Results  Component Value Date   WBC 6.4 04/29/2024   HGB 12.9 04/29/2024   HCT 39.2 04/29/2024   MCV 90.9 04/29/2024   PLT 210.0  04/29/2024   NEUTROABS 4.3 04/29/2024    ASSESSMENT & PLAN:  Malignant neoplasm of upper-outer quadrant of right breast in female, estrogen receptor negative (HCC) Breast MRI performed because of high breast density 05/10/2022: Right breast linear enhancement 1.9 cm at 12 o'clock position, indeterminate mass 8 mm in the lateral periareolar region right breast. 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   Treatment plan: 1.  06/29/2022 neoadjuvant with dose dense Adriamycin   and Cytoxan  x4 (Taxol not given because of neuropathy) 2. 09/15/2022: Right lumpectomy: Grade 3 IDC 2.5 mm DCIS grade 3, margins negative, 0/1 lymph nodes negative 3.  11/22/2022: Completed adjuvant radiation ------------------------------------------------------------------------------------------------------------------------------------------------------- Current treatment: Surveillance   Breast cancer surveillance:   Breast exam 07/03/2024: Benign: Left breast lymphedema: Encouraged her to massage her breast regularly Mammogram 06/18/2024 benign breast density category B Guardant reveal 06/16/2024: 0%   Return to clinic in 1 year for follow-up     No orders of the defined types were placed in this encounter.  The patient has a good understanding of the overall plan. she agrees with it. she will call with any problems that may develop before the next visit here.  I personally spent a total of 30 minutes in the care of the patient today including preparing to see the patient, getting/reviewing separately obtained history, performing a medically appropriate exam/evaluation, counseling and educating, placing orders, referring and communicating with other health care professionals, documenting clinical information in the EHR, independently interpreting results,  communicating results, and coordinating care.   Viinay K Tamy Accardo, MD 07/03/24

## 2024-07-04 ENCOUNTER — Other Ambulatory Visit: Payer: Self-pay

## 2024-07-29 ENCOUNTER — Encounter (HOSPITAL_COMMUNITY): Payer: Self-pay

## 2024-07-29 ENCOUNTER — Emergency Department (HOSPITAL_COMMUNITY): Admission: EM | Admit: 2024-07-29 | Discharge: 2024-07-30

## 2024-07-29 ENCOUNTER — Other Ambulatory Visit: Payer: Self-pay

## 2024-07-29 DIAGNOSIS — Z5321 Procedure and treatment not carried out due to patient leaving prior to being seen by health care provider: Secondary | ICD-10-CM | POA: Diagnosis not present

## 2024-07-29 DIAGNOSIS — K625 Hemorrhage of anus and rectum: Secondary | ICD-10-CM | POA: Insufficient documentation

## 2024-07-29 LAB — COMPREHENSIVE METABOLIC PANEL WITH GFR
ALT: 21 U/L (ref 0–44)
AST: 25 U/L (ref 15–41)
Albumin: 4.1 g/dL (ref 3.5–5.0)
Alkaline Phosphatase: 50 U/L (ref 38–126)
Anion gap: 10 (ref 5–15)
BUN: 11 mg/dL (ref 8–23)
CO2: 24 mmol/L (ref 22–32)
Calcium: 9.4 mg/dL (ref 8.9–10.3)
Chloride: 100 mmol/L (ref 98–111)
Creatinine, Ser: 1.12 mg/dL — ABNORMAL HIGH (ref 0.44–1.00)
GFR, Estimated: 53 mL/min — ABNORMAL LOW (ref >60)
Glucose, Bld: 116 mg/dL — ABNORMAL HIGH (ref 70–99)
Potassium: 3.1 mmol/L — ABNORMAL LOW (ref 3.5–5.1)
Sodium: 134 mmol/L — ABNORMAL LOW (ref 135–145)
Total Bilirubin: 0.9 mg/dL (ref 0.0–1.2)
Total Protein: 7.2 g/dL (ref 6.5–8.1)

## 2024-07-29 LAB — CBC
HCT: 39.3 % (ref 36.0–46.0)
Hemoglobin: 13.4 g/dL (ref 12.0–15.0)
MCH: 30 pg (ref 26.0–34.0)
MCHC: 34.1 g/dL (ref 30.0–36.0)
MCV: 88.1 fL (ref 80.0–100.0)
Platelets: 236 K/uL (ref 150–400)
RBC: 4.46 MIL/uL (ref 3.87–5.11)
RDW: 13.2 % (ref 11.5–15.5)
WBC: 8.2 K/uL (ref 4.0–10.5)
nRBC: 0 % (ref 0.0–0.2)

## 2024-07-29 LAB — ABO/RH: ABO/RH(D): B POS

## 2024-07-29 LAB — TYPE AND SCREEN
ABO/RH(D): B POS
Antibody Screen: NEGATIVE

## 2024-07-29 NOTE — ED Triage Notes (Signed)
 Pt arrived from home via POV c/o rectal bleeding. Pt has colonoscopy planned for tomorrow while completing prep for the procedure pts bm went from clear to bright red. Pt states that she can feel the blood coming out while we are going through the triage questions. Pt states that she has a hand towel in her pants at present and that the towel is not saturated at this point.

## 2024-07-29 NOTE — ED Triage Notes (Signed)
 Pt c.o rectal bleeding that started about 1 hour ago, states she was finishing up her bowel prep for her colonoscopy for tomorrow. Pt c.o some rectal irritation.

## 2024-07-30 NOTE — ED Notes (Signed)
 PT left AMA

## 2025-02-24 ENCOUNTER — Ambulatory Visit

## 2025-07-06 ENCOUNTER — Inpatient Hospital Stay: Admitting: Hematology and Oncology
